# Patient Record
Sex: Male | Born: 1975 | Race: White | Hispanic: No | Marital: Married
Health system: Midwestern US, Community
[De-identification: ages and names within clinical notes are randomized; demographics above are authoritative.]

## PROBLEM LIST (undated history)

## (undated) DIAGNOSIS — R519 Headache, unspecified: Secondary | ICD-10-CM

## (undated) DIAGNOSIS — L0291 Cutaneous abscess, unspecified: Secondary | ICD-10-CM

## (undated) DIAGNOSIS — G43909 Migraine, unspecified, not intractable, without status migrainosus: Secondary | ICD-10-CM

## (undated) DIAGNOSIS — R51 Headache: Secondary | ICD-10-CM

## (undated) DIAGNOSIS — B019 Varicella without complication: Secondary | ICD-10-CM

## (undated) DIAGNOSIS — E785 Hyperlipidemia, unspecified: Secondary | ICD-10-CM

## (undated) DIAGNOSIS — E119 Type 2 diabetes mellitus without complications: Secondary | ICD-10-CM

## (undated) HISTORY — DX: Hyperlipidemia, unspecified: E78.5

## (undated) HISTORY — DX: Type 2 diabetes mellitus without complications: E11.9

## (undated) HISTORY — DX: Headache, unspecified: R51.9

## (undated) HISTORY — DX: Migraine, unspecified, not intractable, without status migrainosus: G43.909

## (undated) HISTORY — DX: Headache: R51

## (undated) HISTORY — DX: Varicella without complication: B01.9

## (undated) HISTORY — PX: INCISE AND DRAIN ABCESS: PRO64

---

## 2012-03-01 ENCOUNTER — Emergency Department (HOSPITAL_COMMUNITY)
Admission: EM | Admit: 2012-03-01 | Discharge: 2012-03-01 | Disposition: A | Discharge status: Home / Self Care | Payer: Worker's Compensation | Attending: Emergency Medicine | Admitting: Emergency Medicine

## 2012-03-01 DIAGNOSIS — S61209A Unspecified open wound of unspecified finger without damage to nail, initial encounter: Secondary | ICD-10-CM | POA: Insufficient documentation

## 2012-03-01 DIAGNOSIS — Y9389 Activity, other specified: Secondary | ICD-10-CM | POA: Insufficient documentation

## 2012-03-01 DIAGNOSIS — Y929 Unspecified place or not applicable: Secondary | ICD-10-CM | POA: Insufficient documentation

## 2012-03-01 DIAGNOSIS — S61019A Laceration without foreign body of unspecified thumb without damage to nail, initial encounter: Secondary | ICD-10-CM

## 2012-03-01 DIAGNOSIS — W260XXA Contact with knife, initial encounter: Secondary | ICD-10-CM | POA: Insufficient documentation

## 2012-03-01 MED ORDER — IBUPROFEN 800 MG PO TABS: 800.0000 mg | ORAL_TABLET | Freq: Three times a day (TID) | ORAL | Status: DC | PRN

## 2012-03-01 NOTE — ED Provider Notes (Signed)
Medical screening examination/treatment/procedure(s) were performed by non-physician practitioner and as supervising physician I was immediately available for consultation/collaboration.  Gerhard Munch, MD 03/01/12 [phone]

## 2012-03-01 NOTE — ED Provider Notes (Signed)
History     CSN: [id number]  Arrival date & time 03/01/12  2033   First MD Initiated Contact with Patient 03/01/12 2110      Chief Complaint  Patient presents with  . Laceration    (Consider location/radiation/quality/duration/timing/severity/associated sxs/prior treatment) HPI Patient presents emergency department laceration to the lateral aspect of his right thumb.  Patient states he has full range of motion and normal sensation in his thumb. patient states he was sharpening a knife and went to wipe the knife off and missed the towel and cut his finger.  Patient denies numbness or weakness.  Patient states shot is up-to-date, having been updated 3 years ago. No past medical history on file.  No past surgical history on file.  No family history on file.  History  Substance Use Topics  . Smoking status: Not on file  . Smokeless tobacco: Not on file  . Alcohol Use: Not on file      Review of Systems All other systems negative except as documented in the HPI. All pertinent positives and negatives as reviewed in the HPI.  Allergies  Review of patient's allergies indicates no known allergies.  Home Medications  No current outpatient prescriptions on file.  BP 141/91  Pulse 96  Temp 98.1 F (36.7 C) (Oral)  Resp 16  SpO2 100%  Physical Exam  Constitutional: He is oriented to person, place, and time. He appears well-developed and well-nourished. No distress.  HENT:  Head: Normocephalic and atraumatic.  Pulmonary/Chest: Effort normal.  Musculoskeletal:       Right hand: He exhibits laceration. He exhibits normal range of motion, normal two-point discrimination and normal capillary refill. normal sensation noted. Normal strength noted. He exhibits no finger abduction.       Hands: Neurological: He is alert and oriented to person, place, and time.  Skin: Skin is warm and dry.    ED Course  Procedures (including critical care time)  LACERATION REPAIR Performed by:  Carlyle Dolly Authorized by: Carlyle Dolly Consent: Verbal consent obtained. Risks and benefits: risks, benefits and alternatives were discussed Consent given by: patient Patient identity confirmed: provided demographic data Prepped and Draped in normal sterile fashion Wound explored  Laceration Location: R lateral thumb  Laceration Length: 3 cm  No Foreign Bodies seen or palpated  Anesthesia: local infiltration  Local anesthetic: lidocaine 2% w/o  epinephrine  Anesthetic total: 5 ml  Irrigation method: syringe Amount of cleaning: standard  Skin closure: 4-0 Prolene  Number of sutures: 4  Technique: Simple Interrupted  Patient tolerance: Patient tolerated the procedure well with no immediate complications.   Wound is a superficial flap. Patient is advised to have sutures out in 10-14 days. Told to return here as needed. Keep area clean and dry. The knife was very sharp and mild a fine slice. MDM          Carlyle Dolly, PA-C 03/01/12 2229

## 2012-03-01 NOTE — ED Notes (Signed)
Pt cut R thumb with large kitchen knife. Bleeding controlled with pressure.

## 2012-04-12 ENCOUNTER — Ambulatory Visit (HOSPITAL_BASED_OUTPATIENT_CLINIC_OR_DEPARTMENT_OTHER): Payer: 59

## 2012-04-12 ENCOUNTER — Ambulatory Visit (INDEPENDENT_AMBULATORY_CARE_PROVIDER_SITE_OTHER): Payer: 59 | Admitting: Family Medicine

## 2012-04-12 ENCOUNTER — Ambulatory Visit (HOSPITAL_BASED_OUTPATIENT_CLINIC_OR_DEPARTMENT_OTHER)
Admission: RE | Admit: 2012-04-12 | Discharge: 2012-04-12 | Disposition: A | Discharge status: Home / Self Care | Payer: 59 | Source: Ambulatory Visit | Attending: Family Medicine | Admitting: Family Medicine

## 2012-04-12 ENCOUNTER — Encounter: Payer: Self-pay | Admitting: Family Medicine

## 2012-04-12 VITALS — BP 100/80 | HR 69 | Temp 98.1°F | Ht 68.0 in | Wt 204.2 lb

## 2012-04-12 DIAGNOSIS — M79609 Pain in unspecified limb: Secondary | ICD-10-CM | POA: Insufficient documentation

## 2012-04-12 DIAGNOSIS — F17200 Nicotine dependence, unspecified, uncomplicated: Secondary | ICD-10-CM

## 2012-04-12 DIAGNOSIS — M7989 Other specified soft tissue disorders: Secondary | ICD-10-CM

## 2012-04-12 DIAGNOSIS — E119 Type 2 diabetes mellitus without complications: Secondary | ICD-10-CM | POA: Insufficient documentation

## 2012-04-12 DIAGNOSIS — E1149 Type 2 diabetes mellitus with other diabetic neurological complication: Secondary | ICD-10-CM

## 2012-04-12 DIAGNOSIS — B353 Tinea pedis: Secondary | ICD-10-CM

## 2012-04-12 DIAGNOSIS — G43909 Migraine, unspecified, not intractable, without status migrainosus: Secondary | ICD-10-CM

## 2012-04-12 DIAGNOSIS — M799 Soft tissue disorder, unspecified: Secondary | ICD-10-CM

## 2012-04-12 LAB — CBC WITH DIFFERENTIAL/PLATELET
Basophils Absolute: 0 10*3/uL (ref 0.0–0.1)
Basophils Relative: 0.4 % (ref 0.0–3.0)
Eosinophils Absolute: 0.1 10*3/uL (ref 0.0–0.7)
MCHC: 35.2 g/dL (ref 30.0–36.0)
MCV: 93.1 fl (ref 78.0–100.0)
Monocytes Absolute: 0.6 10*3/uL (ref 0.1–1.0)
Neutrophils Relative %: 59.1 % (ref 43.0–77.0)
Platelets: 210 10*3/uL (ref 150.0–400.0)
RBC: 4.85 Mil/uL (ref 4.22–5.81)
RDW: 12.6 % (ref 11.5–14.6)

## 2012-04-12 LAB — TSH: TSH: 1.38 u[IU]/mL (ref 0.35–5.50)

## 2012-04-12 LAB — HEPATIC FUNCTION PANEL
AST: 14 U/L (ref 0–37)
Bilirubin, Direct: 0.1 mg/dL (ref 0.0–0.3)
Total Bilirubin: 0.5 mg/dL (ref 0.3–1.2)

## 2012-04-12 LAB — LIPID PANEL
Cholesterol: 142 mg/dL (ref 0–200)
HDL: 24.3 mg/dL — ABNORMAL LOW (ref >39.00)
VLDL: 51 mg/dL — ABNORMAL HIGH (ref 0.0–40.0)

## 2012-04-12 LAB — BASIC METABOLIC PANEL
BUN: 9 mg/dL (ref 6–23)
GFR: 135.03 mL/min (ref >60.00)
Potassium: 3.9 mEq/L (ref 3.5–5.1)

## 2012-04-12 MED ORDER — CLOTRIMAZOLE-BETAMETHASONE 1-0.05 % EX CREA: TOPICAL_CREAM | Freq: Two times a day (BID) | CUTANEOUS | Status: DC

## 2012-04-12 MED ORDER — SUMATRIPTAN SUCCINATE 50 MG PO TABS: 50.0000 mg | ORAL_TABLET | ORAL | Status: DC | PRN

## 2012-04-12 NOTE — Patient Instructions (Addendum)
Follow up in 3-4 weeks We'll notify you of your lab results and start meds based on this Start the Lotrisone cream twice daily We'll call you with your [place] appt If you start to develop a headache, take 2 excedrin for migraine as soon as you can If the headache becomes full blown, take the Imitrex QUIT SMOKING! Limit your sugar and carb intake to lower your sugars We'll call you with your eye exam Call with any questions or concerns Hang in there!!!

## 2012-04-12 NOTE — Progress Notes (Signed)
  Subjective:    Patient ID: Edward [name], male    DOB: 03/25/1975, 37 y.o.   MRN: [id number]  HPI New to establish.  No previous MD  DM- pt was dx'd 2 yrs ago while hospitalized for cat bite.  Took 1 month of Metformin and then stopped.  Not checking sugars, not following low carb diet.  No recent eye exam.  No CP, some SOB w/ activity, weekly R frontal HAs.  No recent eye exam.  Increase thirst, urination, hunger.  + 20 lb weight loss.  HAs- wife reports 'scary HAs'.  Occuring ~1x/week, will improve w/ sleep.  R frontal.  Wife reports last week he had 'almost like a seizure' w/ HA.  No nausea.  + phonophobia, no photophobia.  HAs will typically occur at night.  'leg problems'- having an 'itchy burn' of feet bilaterally, knot on R posterior calf- pain will radiate up into knee w/ prolonged standing.  Seems to be growing in size.  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   Review of Systems For ROS see HPI     Objective:   Physical Exam  Vitals reviewed. Constitutional: He is oriented to person, place, and time. He appears well-developed and well-nourished. No distress.  HENT:  Head: Normocephalic and atraumatic.  Eyes: Conjunctivae and EOM are normal. Pupils are equal, round, and reactive to light.  Neck: Normal range of motion. Neck supple. No thyromegaly present.  Cardiovascular: Normal rate, regular rhythm, normal heart sounds and intact distal pulses.   No murmur heard. Pulmonary/Chest: Effort normal and breath sounds normal. No respiratory distress.  Abdominal: Soft. Bowel sounds are normal. He exhibits no distension.  Musculoskeletal: He exhibits no edema.  Soft tissue mass on R posterior calf, + TTP, ~3 cm  Lymphadenopathy:    He has no cervical adenopathy.  Neurological: He is alert and oriented to person, place, and time. He has normal reflexes. No cranial nerve deficit. Coordination normal.  Skin: Skin is warm and dry.  Fungal infxn of feet bilaterally  Psychiatric: He has a normal  mood and affect. His behavior is normal.          Assessment & Plan:

## 2012-04-16 ENCOUNTER — Encounter: Payer: Self-pay | Admitting: Family Medicine

## 2012-04-17 MED ORDER — METFORMIN HCL 1000 MG PO TABS: ORAL_TABLET | ORAL | Status: DC

## 2012-04-18 ENCOUNTER — Telehealth: Payer: Self-pay | Admitting: *Deleted

## 2012-04-18 ENCOUNTER — Encounter: Payer: Self-pay | Admitting: *Deleted

## 2012-04-18 DIAGNOSIS — M7989 Other specified soft tissue disorders: Secondary | ICD-10-CM

## 2012-04-18 NOTE — Telephone Encounter (Signed)
Message copied by Verdie Shire on Tue Apr 18, 2012 12:11 PM ------      Message from: Sheliah Hatch      Created: Wed Apr 12, 2012 11:12 AM       No blood clot noted.  Will need to see general surgery if he would like this removed ------

## 2012-04-18 NOTE — Telephone Encounter (Addendum)
Spoke with the pt on 04-17-12 and informed him of recent US results and note.  Pt understood and agreed to the referral to general surgery.  Refer ordered.//AB/CMA

## 2012-04-24 ENCOUNTER — Encounter (INDEPENDENT_AMBULATORY_CARE_PROVIDER_SITE_OTHER): Payer: Self-pay | Admitting: Surgery

## 2012-04-24 ENCOUNTER — Ambulatory Visit (INDEPENDENT_AMBULATORY_CARE_PROVIDER_SITE_OTHER): Payer: Commercial Managed Care - PPO | Admitting: Surgery

## 2012-04-24 ENCOUNTER — Other Ambulatory Visit (INDEPENDENT_AMBULATORY_CARE_PROVIDER_SITE_OTHER): Payer: Self-pay | Admitting: Surgery

## 2012-04-24 VITALS — BP 134/86 | HR 68 | Temp 97.6°F | Resp 14 | Ht 68.0 in | Wt 205.2 lb

## 2012-04-24 DIAGNOSIS — M799 Soft tissue disorder, unspecified: Secondary | ICD-10-CM

## 2012-04-24 DIAGNOSIS — M7989 Other specified soft tissue disorders: Secondary | ICD-10-CM

## 2012-04-24 NOTE — Patient Instructions (Signed)
  [facility] -- DISCHARGE INSTRUCTIONS  REMINDER:   Carry a list of your medications and allergies with you at all times  Call your pharmacy at least 1 week in advance to refill prescriptions  Do not mix any prescribed pain medicine with alcohol  Do not drive any motor vehicles while taking pain medication  Take medications with food unless otherwise directed  Follow-up appointments (date to return to physician): Please call [phone] to confirm your follow up appointment with your surgeon.  Call your Surgeon if you have:  Temperature greater than 101.0  Persistent nausea and vomiting  Severe uncontrolled pain  Redness, tenderness, or signs of infection (pain, swelling, redness, odor or    green/yellow discharge around the site)  Difficulty breathing, headache or visual disturbances  Hives  Persistent dizziness or light-headedness  Any other questions or concerns you may have after discharge  In an emergency, call 911 or go to an Emergency Department at a nearby hospital.  Diet: Begin with liquids, and if they are tolerated, resume your usual diet.  Avoid spicy, greasy or heavy foods.  If you have nausea or vomiting, go back to liquids.  If you cannot keep liquids down, call your doctor.  Avoid alcohol consumption while on prescription pain medications. Good nutrition promotes healing. Increase fiber and fluids.   Velora Heckler, MD, [facility] Office: [phone]

## 2012-04-24 NOTE — Progress Notes (Signed)
General Surgery [facility]  Chief Complaint  Patient presents with  . Mass    new pt - eval mass on right calf - referral from Dr. Neena Rhymes    HISTORY: Patient is a 37 year old white male who presents on referral from his primary care physician with a soft tissue mass on the right posterior medial calf. This has been present for approximately one year. He relates no history of trauma. He has noted gradual enlargement. He has occasional pain with physical activity.  Patient underwent a venous duplex study of the right lower extremity. This showed a normal venous system. The mass measured 2.1 x 0.6 x 1.2 cm. It appears to be largely cystic although there is a 7 mm long linear solid area which is echogenic and may represent calcification. The mass does not appear to be related to the venous system.  Patient now presents on referral for consideration for excisional biopsy.  Past Medical History  Diagnosis Date  . Chicken pox   . Diabetes mellitus without complication   . Increased frequency of headaches      Current Outpatient Prescriptions  Medication Sig Dispense Refill  . clotrimazole-betamethasone (LOTRISONE) cream Apply topically 2 (two) times daily.  30 g  0  . ibuprofen (ADVIL,MOTRIN) 800 MG tablet Take 1 tablet (800 mg total) by mouth every 8 (eight) hours as needed for pain.  21 tablet  0  . metFORMIN (GLUCOPHAGE) 1000 MG tablet 1/2 tab twice daily x2 weeks and then increase to 1 tab twice daily  60 tablet  3  . SUMAtriptan (IMITREX) 50 MG tablet Take 1 tablet (50 mg total) by mouth every 2 (two) hours as needed for migraine.  10 tablet  0   No current facility-administered medications for this visit.     No Known Allergies   Family History  Problem Relation Age of Onset  . Heart disease Mother   . Diabetes Mother   . Hyperlipidemia Father   . Stroke Father   . Hypertension Father   . Cancer Paternal Grandfather     prostate      History   Social History  . Marital Status: Married    Spouse Name: N/A    Number of Children: N/A  . Years of Education: N/A   Social History Main Topics  . Smoking status: Current Every Day Smoker -- 1.00 packs/day    Types: Cigarettes  . Smokeless tobacco: None  . Alcohol Use: Yes  . Drug Use: No  . Sexually Active: None   Other Topics Concern  . None   Social History Narrative  . None     REVIEW OF SYSTEMS - PERTINENT POSITIVES ONLY: Gradual slow enlargement of mass right posterior calf. Mild discomfort with physical activity. No previous such lesions removed.  EXAM: Filed Vitals:   04/24/12 1141  BP: 134/86  Pulse: 68  Temp: 97.6 F (36.4 C)  Resp: 14    HEENT: normocephalic; pupils equal and reactive; sclerae clear; dentition good; mucous membranes moist NECK:  symmetric on extension; no palpable anterior or posterior cervical lymphadenopathy; no supraclavicular masses; no tenderness CHEST: clear to auscultation bilaterally without rales, rhonchi, or wheezes CARDIAC: regular rate and rhythm without significant murmur; peripheral pulses are full EXT:  non-tender without edema; no deformity; prominent varicosities left calf; palpable mass right posterior medial calf, subcutaneous, approximately 2.5 cm in greatest dimension, nontender NEURO: no gross focal deficits; no sign of tremor   LABORATORY RESULTS: See [facility] (CHL-Epic) for most recent results   RADIOLOGY RESULTS: See [facility] (CHL-Epic) for most recent results   IMPRESSION: Right calf mass, 2.5 cm, of uncertain significance  PLAN: The patient and I discussed the above findings. Given the fact that this is gradually enlarged and that the diagnosis is in question after diagnostic studies, I have recommended surgical excision for definitive diagnosis. Patient agrees. We will perform this as an outpatient surgical procedure in the near future at a time convenient for the patient.   Restrictions on his activities following the procedure are discussed.  The risks and benefits of the procedure have been discussed at length with the patient.  The patient understands the proposed procedure, potential alternative treatments, and the course of recovery to be expected.  All of the patient's questions have been answered at this time.  The patient wishes to proceed with surgery.  Velora Heckler, MD, FACS General & Endocrine Surgery [facility]   Visit Diagnoses: 1. Soft tissue mass     Primary Care Physician: Neena Rhymes, MD

## 2012-04-27 ENCOUNTER — Ambulatory Visit (INDEPENDENT_AMBULATORY_CARE_PROVIDER_SITE_OTHER): Payer: 59 | Admitting: Internal Medicine

## 2012-04-27 VITALS — BP 120/76 | HR 71 | Temp 98.0°F | Resp 16 | Ht 68.0 in | Wt 204.0 lb

## 2012-04-27 DIAGNOSIS — B3749 Other urogenital candidiasis: Secondary | ICD-10-CM

## 2012-04-27 DIAGNOSIS — M79609 Pain in unspecified limb: Secondary | ICD-10-CM

## 2012-04-27 DIAGNOSIS — M79604 Pain in right leg: Secondary | ICD-10-CM

## 2012-04-27 DIAGNOSIS — B3742 Candidal balanitis: Secondary | ICD-10-CM

## 2012-04-27 DIAGNOSIS — R229 Localized swelling, mass and lump, unspecified: Secondary | ICD-10-CM

## 2012-04-27 DIAGNOSIS — R2241 Localized swelling, mass and lump, right lower limb: Secondary | ICD-10-CM

## 2012-04-27 DIAGNOSIS — E1310 Other specified diabetes mellitus with ketoacidosis without coma: Secondary | ICD-10-CM

## 2012-04-27 DIAGNOSIS — E1110 Type 2 diabetes mellitus with ketoacidosis without coma: Secondary | ICD-10-CM

## 2012-04-27 MED ORDER — HYDROCODONE-ACETAMINOPHEN 5-325 MG PO TABS: 1.0000 | ORAL_TABLET | Freq: Four times a day (QID) | ORAL | Status: DC | PRN

## 2012-04-27 MED ORDER — ECONAZOLE NITRATE 1 % EX CREA: TOPICAL_CREAM | Freq: Every day | CUTANEOUS | Status: DC

## 2012-04-27 MED ORDER — FLUCONAZOLE 150 MG PO TABS: ORAL_TABLET | ORAL | Status: DC

## 2012-04-27 NOTE — Patient Instructions (Signed)
Balanitis Balanitis is an common infection of the head (glans) of the penis. CAUSES  Balanitis has multiple causes. Frequently balanitis is the result of poor personal hygiene. Especially if no circumcision has been done. Without adequate washing, many different kinds of germs (viruses, bacteria, and yeast) collect between the foreskin and the glans. This can cause an infection. Lack of air and irritation from a normal secretion called smegma contribute to the cause in uncircumcised males. Other causes include chemical irritation by certain soaps (especially soaps with perfumes). When no circumcision has been done, a frequent cause of poor hygiene is that the tip of the foreskin is tight (phimosis) and cannot be pulled back for adequate washing. Illnesses in other areas of the body can also cause balanitis. This includes illnesses that cause water retention and swelling, such as:  Heart failure.  Cirrhosis of the liver.  Kidney problems. Other contributing causes include:  Obesity.  Certain allergies to drugs such as tetracycline and sulfa.  Diabetes. SYMPTOMS  Symptoms may include:  Discharge coming from under the foreskin.  Tenderness.  Itching and inability to get an erection (because of the pain).  Redness and a rash is frequently seen.  If the problem remains for a while, sores can be seen on the glans and on the foreskin. If the condition is not treated other complications such as a scar of the opening to the urethra (tube that carries the urine out from the bladder) can occur and block the bladder. This narrowing is called meatal stenosis. Other problems can occur such as:  Infection of the lymph nodes in the crease of the groin.  Ballooning of the foreskin when voiding (when the foreskin opening has scarred down and been made smaller).  Blockage of the bladder.  Frequent urinary infections occur in children with balanitis. HOME CARE INSTRUCTIONS   Pull back foreskin  to urinate and when washing.  Pull back foreskin when putting medication on the affected area to prevent the foreskin from swelling and being trapped behind the head.  Keep foreskin and glans clean and dry.  Sitz baths may be helpful.  Take your medication as directed .  Pain medication, if needed.  Circumcision (may be recommended). SEEK IMMEDIATE MEDICAL CARE IF:   The affected area becomes trapped behind the head.  You start a fever.  The swelling increases. Document Released: 07/04/2008 Document Revised: 05/10/2011 Document Reviewed: 07/04/2008 Exit[employer] Patient Information 2013 [employer].  

## 2012-04-27 NOTE — Progress Notes (Signed)
  Subjective:    Patient ID: Edward [name], male    DOB: [birth year], 37 y.o.   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 Labs and chart reviewed in epic. Has rash on penis, itchy with white disch. No pain. Also has pain right calf and has a mass in calf needs removal cell type unknown. Calf cramping and sxs all the time. Surgery to be scheduled   Review of Systems     Objective:   Physical Exam  Vitals reviewed. Constitutional: He appears well-developed and well-nourished.  Genitourinary: Testes normal.    Right testis shows no tenderness. Penile erythema present. No penile tenderness.  Lymphadenopathy:       Right: No inguinal adenopathy present.       Left: No inguinal adenopathy present.   Red scaly rash all over distal penis with cheesy white discharge. Mass right calf       Assessment & Plan:  Yeast balanitis acute NIDDM uncontrolled Mass leg Pain leg  Econazole/diflucan/HC

## 2012-04-30 NOTE — Assessment & Plan Note (Signed)
New to provider, ongoing for pt.  Strongly encouraged smoking cessation.  Will discuss possibly starting chantix at future visits but getting DM under control is top priority.

## 2012-04-30 NOTE — Assessment & Plan Note (Signed)
New.  Not consistent w/ varicosity.  Get [place] to assess.  Depending on [place] results, refer to general surgery.  Pt expressed understanding and is in agreement w/ plan.

## 2012-04-30 NOTE — Assessment & Plan Note (Addendum)
New to Edward [name], ongoing for pt.  Pt dx'd years ago but not currently taking meds or following low carb diet, getting eye exams, or attempting to control sugars.  Refer for eye exam.  Check labs to determine baseline A1C and determine appropriate meds at that time.  Stressed need for compliance.  Will follow closely.

## 2012-04-30 NOTE — Assessment & Plan Note (Signed)
New to provider.  Start Lotrisone twice daily.  Pt expressed understanding and is in agreement w/ plan.

## 2012-04-30 NOTE — Assessment & Plan Note (Signed)
New to provider.  Pt's sxs consistent w/ migraine.  Discussed migraine tx (see pt instructions).  imitrex script given.  In no improvement, will refer to neuro.  Reviewed supportive care and red flags that should prompt return.  Pt expressed understanding and is in agreement w/ plan.

## 2012-05-01 ENCOUNTER — Encounter: Payer: Self-pay | Admitting: Family Medicine

## 2012-05-01 ENCOUNTER — Ambulatory Visit (INDEPENDENT_AMBULATORY_CARE_PROVIDER_SITE_OTHER): Payer: 59 | Admitting: Family Medicine

## 2012-05-01 VITALS — BP 120/70 | HR 77 | Temp 97.9°F | Ht 68.0 in | Wt 202.2 lb

## 2012-05-01 DIAGNOSIS — M799 Soft tissue disorder, unspecified: Secondary | ICD-10-CM

## 2012-05-01 DIAGNOSIS — B353 Tinea pedis: Secondary | ICD-10-CM

## 2012-05-01 DIAGNOSIS — E1149 Type 2 diabetes mellitus with other diabetic neurological complication: Secondary | ICD-10-CM

## 2012-05-01 DIAGNOSIS — M7989 Other specified soft tissue disorders: Secondary | ICD-10-CM

## 2012-05-01 NOTE — Progress Notes (Signed)
  Subjective:    Patient ID: Edward [name], male    DOB: 1975-05-14, 37 y.o.   MRN: [id number]  HPI Leg pain- pt is due to have surgery but they want $500 up front and pt doesn't have the cash right now.  Surgery is now scheduled for late April.  Pt now fears cancer after visit to UC.  Now on hydrocodone for pain.  DM- chronic problem.  Is taking 500mg  BID.  Set to increase to 1000mg  BID.  Has tried to make dietary changes and limit carb intake.  Referral for eye exam made at last visit- has not yet gone.  Tinea pedis- sxs are improving, stopped using Lotrisone when he developed yeast balanitis.  Now using econazole on groin but nothing on feet   Review of Systems For ROS see HPI     Objective:   Physical Exam  Constitutional: He is oriented to person, place, and time. He appears well-developed and well-nourished. No distress.  HENT:  Head: Normocephalic and atraumatic.  Eyes: Conjunctivae and EOM are normal. Pupils are equal, round, and reactive to light.  Neck: Normal range of motion. Neck supple. No thyromegaly present.  Cardiovascular: Normal rate, regular rhythm, normal heart sounds and intact distal pulses.   No murmur heard. Pulmonary/Chest: Effort normal and breath sounds normal. No respiratory distress.  Abdominal: Soft. Bowel sounds are normal. He exhibits no distension.  Musculoskeletal: He exhibits no edema.  R posterior calf soft tissue mass, + TTP, pt limping  Lymphadenopathy:    He has no cervical adenopathy.  Neurological: He is alert and oriented to person, place, and time. No cranial nerve deficit.  Skin: Skin is warm and dry.  Fungal infxn of feet bilaterally  Psychiatric: He has a normal mood and affect. His behavior is normal.          Assessment & Plan:

## 2012-05-01 NOTE — Patient Instructions (Addendum)
Follow up in 2 months to recheck sugar and triglycerides Increase the Metformin to 1 tab twice daily The the econazole cream on your feet as well as your gron Keep up the good work- you can do this!

## 2012-05-02 NOTE — Assessment & Plan Note (Signed)
New.  Pt is requiring surgery to remove the mass but he currently is unable to afford this.  Encouraged him to make this a priority.  Due to pain, pt is currently on narcotics from UC- may need to refill in the future.

## 2012-05-02 NOTE — Assessment & Plan Note (Signed)
Improving but still present.  Rather than using the lotrisone, encouraged him to use the cream for his groin on both areas.  Pt expressed understanding and is in agreement w/ plan.

## 2012-05-02 NOTE — Assessment & Plan Note (Signed)
Pt's sugars are improving and pt reports feeling 'better' since starting med.  Missed eye exam.  Will need to reschedule.  Due for A1C in 2 months.  Increase metformin to 1000mg  bid.  Will continue to follow closely.

## 2012-05-04 ENCOUNTER — Telehealth: Payer: Self-pay | Admitting: *Deleted

## 2012-05-04 ENCOUNTER — Other Ambulatory Visit: Payer: Self-pay | Admitting: Internal Medicine

## 2012-05-04 DIAGNOSIS — R2241 Localized swelling, mass and lump, right lower limb: Secondary | ICD-10-CM

## 2012-05-04 DIAGNOSIS — M79604 Pain in right leg: Secondary | ICD-10-CM

## 2012-05-04 MED ORDER — HYDROCODONE-ACETAMINOPHEN 5-325 MG PO TABS: 1.0000 | ORAL_TABLET | Freq: Four times a day (QID) | ORAL | Status: DC | PRN

## 2012-05-04 NOTE — Telephone Encounter (Signed)
Last OV 05-02-11, last filled 04-27-12 #30

## 2012-05-04 NOTE — Telephone Encounter (Signed)
Ok for #30, no refills.  This is only for severe pain and should last longer than 1 week.  needs to sign agreement and do UDS if not already done

## 2012-05-04 NOTE — Telephone Encounter (Signed)
Left message to call office, to advise Pt of Dr Beverely Low comments and control agreement.

## 2012-05-12 ENCOUNTER — Ambulatory Visit: Payer: 59

## 2012-05-12 ENCOUNTER — Telehealth: Payer: Self-pay | Admitting: Family Medicine

## 2012-05-12 DIAGNOSIS — M79604 Pain in right leg: Secondary | ICD-10-CM

## 2012-05-12 DIAGNOSIS — R2241 Localized swelling, mass and lump, right lower limb: Secondary | ICD-10-CM

## 2012-05-12 NOTE — Telephone Encounter (Signed)
refill Hydrocodone-APA 5.325, Take 1 tablet by mouth every 6 hours as needed for severe pain, last fill 3.10.14--Pharmacy Comments -PT KNOWS IT'S NOT DUE TIL MONDAY THANKS

## 2012-05-14 NOTE — Telephone Encounter (Signed)
RTC

## 2012-05-15 ENCOUNTER — Other Ambulatory Visit: Payer: Self-pay | Admitting: Family Medicine

## 2012-05-15 ENCOUNTER — Encounter: Payer: Self-pay | Admitting: Family Medicine

## 2012-05-15 DIAGNOSIS — R2241 Localized swelling, mass and lump, right lower limb: Secondary | ICD-10-CM

## 2012-05-15 DIAGNOSIS — M79604 Pain in right leg: Secondary | ICD-10-CM

## 2012-05-15 NOTE — Telephone Encounter (Signed)
This medicine is only for severe pain and should not be used regularly (which it is if he needs a refill).  Ok for #30 but if pain doesn't improve and he isn't going to proceed w/ surgery, he will need pain clinic referral

## 2012-05-15 NOTE — Telephone Encounter (Signed)
Please advise on RF request.  Last RF:05-08-12,no recent OV.//AB/CMA

## 2012-05-16 NOTE — Telephone Encounter (Signed)
Received the med request and sent to the provider to address.//AB/CMA

## 2012-05-16 NOTE — Telephone Encounter (Signed)
Lm @ (4:48pm) asking the pt to RTC.//AB/CMA

## 2012-05-17 MED ORDER — HYDROCODONE-ACETAMINOPHEN 5-325 MG PO TABS: 1.0000 | ORAL_TABLET | Freq: Four times a day (QID) | ORAL | Status: DC | PRN

## 2012-05-17 NOTE — Telephone Encounter (Signed)
Ok for #45, no refills 

## 2012-05-17 NOTE — Telephone Encounter (Signed)
Rx has been approved and put up front for the pt to pick-up.//AB/CMA

## 2012-05-17 NOTE — Telephone Encounter (Signed)
Last seen 05/01/12 and filled 05/04/12 # 30. Please advise      KP

## 2012-05-17 NOTE — Telephone Encounter (Signed)
LM @ (10:18am) asking the pt to RTC.//AB/CMA

## 2012-05-17 NOTE — Telephone Encounter (Signed)
Spoke with the pt's wife and informed her to let the pt know that the medicine is to be used only for severe pain and should not be used regularly.  Informed her that Dr. Beverely Low is okaying #30, but if the pain doesn't improve and he isn't going to proceed w/ surgery, he will need pain clinic referral.  The wife stated that she has been checking his calf and it is red.  Asked her if the pt needs to be rechecked, and she stated that the pt has not proceeded with the surgery b/c of the money.  She stated that she will keep checking his calf, and she will let the pt know about the pain clinic referral.  The wife stated that she or the pt will pick-up the rx.  Verbally informed Dr. Beverely Low of this.//AB/CMA

## 2012-05-24 ENCOUNTER — Encounter: Payer: Self-pay | Admitting: Family Medicine

## 2012-05-25 ENCOUNTER — Encounter: Payer: Self-pay | Admitting: Family Medicine

## 2012-05-25 ENCOUNTER — Other Ambulatory Visit: Payer: Self-pay | Admitting: Family Medicine

## 2012-05-25 DIAGNOSIS — R2241 Localized swelling, mass and lump, right lower limb: Secondary | ICD-10-CM

## 2012-05-25 DIAGNOSIS — M79604 Pain in right leg: Secondary | ICD-10-CM

## 2012-05-26 ENCOUNTER — Encounter: Payer: Self-pay | Admitting: Family Medicine

## 2012-05-26 DIAGNOSIS — M79604 Pain in right leg: Secondary | ICD-10-CM

## 2012-05-26 DIAGNOSIS — R2241 Localized swelling, mass and lump, right lower limb: Secondary | ICD-10-CM

## 2012-05-26 NOTE — Telephone Encounter (Signed)
Last filled 05/17/12 # 30. Please advise     KP

## 2012-05-26 NOTE — Telephone Encounter (Signed)
Ok for #30, if he is going through 30 Vicodin in 10 days, he needs to have that mass removed.  Please have him discuss a payment plan w/ the surgeons

## 2012-05-29 MED ORDER — HYDROCODONE-ACETAMINOPHEN 5-325 MG PO TABS: 1.0000 | ORAL_TABLET | Freq: Four times a day (QID) | ORAL | Status: DC | PRN

## 2012-05-29 NOTE — Telephone Encounter (Signed)
Rx refill was printed and will be up front for the pt to pick-up.  Informed the pt by MyChart that the rx was ready for pick-up//AB/CMA

## 2012-05-30 NOTE — Telephone Encounter (Signed)
See MyChart message on (05-29-12) regarding med refill and referral to Pain Management.//AB/CMA

## 2012-06-06 ENCOUNTER — Encounter: Payer: Self-pay | Admitting: Family Medicine

## 2012-10-11 ENCOUNTER — Ambulatory Visit: Payer: 59 | Admitting: Family Medicine

## 2012-10-11 DIAGNOSIS — Z0289 Encounter for other administrative examinations: Secondary | ICD-10-CM

## 2012-12-04 ENCOUNTER — Emergency Department (HOSPITAL_BASED_OUTPATIENT_CLINIC_OR_DEPARTMENT_OTHER): Payer: 59

## 2012-12-04 ENCOUNTER — Encounter (HOSPITAL_BASED_OUTPATIENT_CLINIC_OR_DEPARTMENT_OTHER): Payer: Self-pay

## 2012-12-04 ENCOUNTER — Emergency Department (HOSPITAL_BASED_OUTPATIENT_CLINIC_OR_DEPARTMENT_OTHER)
Admission: EM | Admit: 2012-12-04 | Discharge: 2012-12-04 | Disposition: A | Discharge status: Home / Self Care | Payer: 59 | Attending: Emergency Medicine | Admitting: Emergency Medicine

## 2012-12-04 DIAGNOSIS — Z79899 Other long term (current) drug therapy: Secondary | ICD-10-CM | POA: Insufficient documentation

## 2012-12-04 DIAGNOSIS — F17200 Nicotine dependence, unspecified, uncomplicated: Secondary | ICD-10-CM | POA: Insufficient documentation

## 2012-12-04 DIAGNOSIS — K2970 Gastritis, unspecified, without bleeding: Secondary | ICD-10-CM | POA: Insufficient documentation

## 2012-12-04 DIAGNOSIS — Z8679 Personal history of other diseases of the circulatory system: Secondary | ICD-10-CM | POA: Insufficient documentation

## 2012-12-04 DIAGNOSIS — E119 Type 2 diabetes mellitus without complications: Secondary | ICD-10-CM | POA: Insufficient documentation

## 2012-12-04 DIAGNOSIS — Z8619 Personal history of other infectious and parasitic diseases: Secondary | ICD-10-CM | POA: Insufficient documentation

## 2012-12-04 DIAGNOSIS — R10816 Epigastric abdominal tenderness: Secondary | ICD-10-CM | POA: Insufficient documentation

## 2012-12-04 LAB — COMPREHENSIVE METABOLIC PANEL
ALT: 21 U/L (ref 0–53)
AST: 13 U/L (ref 0–37)
Calcium: 9.8 mg/dL (ref 8.4–10.5)
GFR calc Af Amer: >90 mL/min (ref >90)
Sodium: 138 mEq/L (ref 135–145)
Total Protein: 7.3 g/dL (ref 6.0–8.3)

## 2012-12-04 LAB — URINALYSIS, ROUTINE W REFLEX MICROSCOPIC
Glucose, UA: >1000 mg/dL — AB
Hgb urine dipstick: NEGATIVE
Specific Gravity, Urine: 1.033 — ABNORMAL HIGH (ref 1.005–1.030)
Urobilinogen, UA: 1 mg/dL (ref 0.0–1.0)

## 2012-12-04 LAB — CBC WITH DIFFERENTIAL/PLATELET
Basophils Absolute: 0 10*3/uL (ref 0.0–0.1)
Eosinophils Absolute: 0.1 10*3/uL (ref 0.0–0.7)
Eosinophils Relative: 1 % (ref 0–5)
MCH: 32.7 pg (ref 26.0–34.0)
MCV: 92.3 fL (ref 78.0–100.0)
Platelets: 246 10*3/uL (ref 150–400)
RDW: 12.5 % (ref 11.5–15.5)
WBC: 13.6 10*3/uL — ABNORMAL HIGH (ref 4.0–10.5)

## 2012-12-04 LAB — URINE MICROSCOPIC-ADD ON

## 2012-12-04 MED ORDER — GI COCKTAIL ~~LOC~~
30.0000 mL | Freq: Once | ORAL | Status: AC
  Administered 2012-12-04: 30 mL via ORAL
  Filled 2012-12-04: qty 30

## 2012-12-04 MED ORDER — HYDROMORPHONE HCL PF 1 MG/ML IJ SOLN
1.0000 mg | Freq: Once | INTRAMUSCULAR | Status: AC
  Administered 2012-12-04: 1 mg via INTRAVENOUS
  Filled 2012-12-04: qty 1

## 2012-12-04 MED ORDER — ONDANSETRON HCL 4 MG/2ML IJ SOLN
4.0000 mg | Freq: Once | INTRAMUSCULAR | Status: AC
  Administered 2012-12-04: 4 mg via INTRAVENOUS
  Filled 2012-12-04: qty 2

## 2012-12-04 MED ORDER — PANTOPRAZOLE SODIUM 40 MG IV SOLR
40.0000 mg | Freq: Once | INTRAVENOUS | Status: AC
  Administered 2012-12-04: 40 mg via INTRAVENOUS
  Filled 2012-12-04: qty 40

## 2012-12-04 MED ORDER — PANTOPRAZOLE SODIUM 40 MG PO TBEC: 40.0000 mg | DELAYED_RELEASE_TABLET | Freq: Every day | ORAL | Status: DC

## 2012-12-04 MED ORDER — SODIUM CHLORIDE 0.9 % IV BOLUS (SEPSIS)
1000.0000 mL | Freq: Once | INTRAVENOUS | Status: AC
  Administered 2012-12-04: 1000 mL via INTRAVENOUS

## 2012-12-04 NOTE — ED Notes (Signed)
Diarrhea present, pain feels like something is grabbing and squeezing his abdomen.

## 2012-12-04 NOTE — ED Provider Notes (Signed)
CSN: [id number]     Arrival date & time 12/04/12  1829 History  This chart was scribed for Kristoph B. Bernette Mayers, MD by Greggory Stallion, ED Scribe. This patient was seen in room MH02/MH02 and the patient's care was started at 6:47 PM.   Chief Complaint  Patient presents with  . Upper abdominal spasm    The history is provided by the patient. No language interpreter was used.   HPI Comments: Edward [name] is a 37 y.o. male who presents to the Emergency Department complaining of gradual onset, intermittent epigastric abdominal spasms that started about 2 weeks ago. Pt states they are getting more frequent and worsening. He states he had the same symptoms about 2 months ago. Pt states he was having emesis a few days ago but is just dry heaving now. He states he has had about 2 bottles of Pepto Bismol with no relief. He states he was given hydrocodone and Tylenol for the knot on his leg but stopped taking it recently because he thought it was irritating his stomach. Pt denies hematemesis and difficulty urinating.   Past Medical History  Diagnosis Date  . Chicken pox   . Diabetes mellitus without complication   . Increased frequency of headaches    Past Surgical History  Procedure Laterality Date  . Incise and drain abcess      left hand and jaw   Family History  Problem Relation Age of Onset  . Heart disease Mother   . Diabetes Mother   . Hyperlipidemia Father   . Stroke Father   . Hypertension Father   . Cancer Paternal Grandfather     prostate   History  Substance Use Topics  . Smoking status: Current Every Day Smoker -- 1.00 packs/day    Types: Cigarettes  . Smokeless tobacco: Not on file  . Alcohol Use: Yes    Review of Systems A complete 10 system review of systems was obtained and all systems are negative except as noted in the HPI and PMH.   Allergies  Review of patient's allergies indicates no known allergies.  Home Medications   Current Outpatient Rx  Name   Route  Sig  Dispense  Refill  . clotrimazole-betamethasone (LOTRISONE) cream   Topical   Apply topically 2 (two) times daily.   30 g   0   . econazole nitrate 1 % cream   Topical   Apply topically daily.   30 g   2   . fluconazole (DIFLUCAN) 150 MG tablet      Take 1 po qod till gone   3 tablet   1   . HYDROcodone-acetaminophen (NORCO/VICODIN) 5-325 MG per tablet   Oral   Take 1 tablet by mouth every 6 (six) hours as needed. For severe pain   30 tablet   0   . ibuprofen (ADVIL,MOTRIN) 800 MG tablet   Oral   Take 1 tablet (800 mg total) by mouth every 8 (eight) hours as needed for pain.   21 tablet   0   . metFORMIN (GLUCOPHAGE) 1000 MG tablet      1/2 tab twice daily x2 weeks and then increase to 1 tab twice daily   60 tablet   3   . SUMAtriptan (IMITREX) 50 MG tablet   Oral   Take 1 tablet (50 mg total) by mouth every 2 (two) hours as needed for migraine.   10 tablet   0    BP 130/90  Pulse 79  Temp(Src) 97.6 F (36.4 C)  Resp 18  SpO2 100%  Physical Exam  Nursing note and vitals reviewed. Constitutional: He is oriented to person, place, and time. He appears well-developed and well-nourished.  HENT:  Head: Normocephalic and atraumatic.  Eyes: EOM are normal. Pupils are equal, round, and reactive to light.  Neck: Normal range of motion. Neck supple.  Cardiovascular: Normal rate, normal heart sounds and intact distal pulses.   Pulmonary/Chest: Effort normal and breath sounds normal.  Abdominal: Bowel sounds are normal. He exhibits no distension. There is tenderness (epigastric). There is no guarding.  Musculoskeletal: Normal range of motion. He exhibits no edema and no tenderness.  Neurological: He is alert and oriented to person, place, and time. He has normal strength. No cranial nerve deficit or sensory deficit.  Skin: Skin is warm and dry. No rash noted.  Psychiatric: He has a normal mood and affect.    ED Course  Procedures (including critical  care time)  DIAGNOSTIC STUDIES: Oxygen Saturation is 100% on RA, normal by my interpretation.    COORDINATION OF CARE: 6:54 PM-Discussed treatment plan which includes labs, ultrasound and pain medication with pt at bedside and pt agreed to plan.   Labs Review Labs Reviewed  CBC WITH DIFFERENTIAL - Abnormal; Notable for the following:    WBC 13.6 (*)    Neutro Abs 9.9 (*)    All other components within normal limits  COMPREHENSIVE METABOLIC PANEL - Abnormal; Notable for the following:    Glucose, Bld 188 (*)    All other components within normal limits  URINALYSIS, ROUTINE W REFLEX MICROSCOPIC - Abnormal; Notable for the following:    Specific Gravity, Urine 1.033 (*)    Glucose, UA >1000 (*)    All other components within normal limits  LIPASE, BLOOD  URINE MICROSCOPIC-ADD ON   Imaging Review US Abdomen Complete  12/04/2012   *RADIOLOGY REPORT*  Clinical Data:  Epigastric pain, evaluate for biliary disease  ABDOMINAL ULTRASOUND COMPLETE  Comparison:  None.  Findings:  Gallbladder:  No gallstones, gallbladder wall thickening, or pericholecystic fluid. Per the sonographer, as sonographic Murphy's sign was negative.  Common Bile Duct:  Within normal limits in caliber at 2.4 mm.  Liver: Echogenic hepatic parenchyma with coarsening of the hepatic echotexture consistentwith at least moderate hepatic steatosis.  IVC:  Largely obscured by overlying bowel gas.  Pancreas:  Largely obscured by overlying bowel gas.  Spleen:  Within normal limits in size (7.4 cm) and echotexture.  Right kidney:  Normal in size (10.7 cm) and parenchymal echogenicity.  No evidence of mass or hydronephrosis.  Left kidney:  Normal in size (11.7 cm) and parenchymal echogenicity.  No evidence of mass or hydronephrosis.  Abdominal Aorta:  No aneurysm identified. Portions of the abdominal aorta were obscured by overlying bowel gas.  IMPRESSION:  1.  Negative for cholelithiasis or cholecystitis. 2.  Moderate hepatic steatosis.  3.  Technically difficult study secondary to large volume of obscuring bowel gas.   Original Report Authenticated By: Malachy Moan, M.D.    MDM   1. Gastritis     Labs and imaging reviewed and unremarkable. Pt initially feeling better and getting some rest but having some mild pain now. Abdomen remains benign. Suspect gastritis or PUD. Advised PPI and GI followup.      I personally performed the services described in this documentation, which was scribed in my presence. The recorded information has been reviewed and is accurate.     Fateh B. Bernette Mayers,  MD 12/04/12 2103

## 2012-12-04 NOTE — ED Notes (Signed)
MD at bedside. 

## 2012-12-04 NOTE — ED Notes (Signed)
Symptoms started 2 months ago, comes and goes. Upper abdominal epigastric spasm that has gotten worsen and constant in the past week, radiates to chest, sides.

## 2012-12-05 ENCOUNTER — Telehealth (HOSPITAL_COMMUNITY): Payer: Self-pay | Admitting: Emergency Medicine

## 2013-01-04 ENCOUNTER — Other Ambulatory Visit: Payer: Self-pay

## 2013-02-14 ENCOUNTER — Ambulatory Visit (INDEPENDENT_AMBULATORY_CARE_PROVIDER_SITE_OTHER): Payer: 59 | Admitting: Family Medicine

## 2013-02-14 ENCOUNTER — Encounter: Payer: Self-pay | Admitting: Family Medicine

## 2013-02-14 VITALS — BP 120/72 | HR 60 | Temp 97.8°F | Resp 18 | Ht 68.0 in | Wt 200.0 lb

## 2013-02-14 DIAGNOSIS — E1310 Other specified diabetes mellitus with ketoacidosis without coma: Secondary | ICD-10-CM

## 2013-02-14 DIAGNOSIS — B3749 Other urogenital candidiasis: Secondary | ICD-10-CM

## 2013-02-14 DIAGNOSIS — E1110 Type 2 diabetes mellitus with ketoacidosis without coma: Secondary | ICD-10-CM

## 2013-02-14 DIAGNOSIS — E1165 Type 2 diabetes mellitus with hyperglycemia: Secondary | ICD-10-CM

## 2013-02-14 DIAGNOSIS — B3742 Candidal balanitis: Secondary | ICD-10-CM

## 2013-02-14 LAB — BASIC METABOLIC PANEL
BUN: 11 mg/dL (ref 6–23)
CO2: 26 mEq/L (ref 19–32)
Calcium: 9.3 mg/dL (ref 8.4–10.5)
Chloride: 102 mEq/L (ref 96–112)
Creat: 0.73 mg/dL (ref 0.50–1.35)
Glucose, Bld: 226 mg/dL — ABNORMAL HIGH (ref 70–99)
Potassium: 4.6 mEq/L (ref 3.5–5.3)

## 2013-02-14 LAB — POCT GLYCOSYLATED HEMOGLOBIN (HGB A1C): Hemoglobin A1C: 9.5

## 2013-02-14 MED ORDER — GLIPIZIDE 5 MG PO TABS: 5.0000 mg | ORAL_TABLET | Freq: Two times a day (BID) | ORAL | Status: DC

## 2013-02-14 MED ORDER — FLUCONAZOLE 150 MG PO TABS: ORAL_TABLET | ORAL | Status: DC

## 2013-02-14 NOTE — Progress Notes (Signed)
[facility] [address] [place] [address] [phone]  Date:  02/14/2013   Name:  Edward [name]   DOB:  Sep 16, 1975   MRN:  [id number]  PCP:  Neena Rhymes, MD    Chief Complaint: rash on gential area   History of Present Illness:  Edward [name] is a 37 y.o. very pleasant male patient who presents with the following:  He is here today with a possible recurrence of candida balanitis for about one week.  His PCP is Dr. Beverely Low.  Admits that he is not very compliant with his DM medications.  He is using an antifungal cream but this does not seem to help.  He has a little swelling and redness of the penis.  No pain.  He is able to eat.  He is able to urinate He was treated for this in February with diflucan 150 qd for 3 days.  This was successful.    He is aware that his frequent yeast infections are likely related to poor glucose control.  He has been off his metformin for a couple of months.  It makes him feel bad so he does not take it.  Last visit with his PCP over 6 months ago.  He would be interested in trying a different medication   Patient Active Problem List   Diagnosis Date Noted  . Type II or unspecified type diabetes mellitus with neurological manifestations, not stated as uncontrolled(250.60) 04/12/2012  . Tinea pedis 04/12/2012  . Migraine, unspecified, without mention of intractable migraine without mention of status migrainosus 04/12/2012  . Soft tissue mass 04/12/2012  . Tobacco use disorder 04/12/2012    Past Medical History  Diagnosis Date  . Chicken pox   . Diabetes mellitus without complication   . Increased frequency of headaches     Past Surgical History  Procedure Laterality Date  . Incise and drain abcess      left hand and jaw    History  Substance Use Topics  . Smoking status: Current Every Day Smoker -- 1.00 packs/day    Types: Cigarettes  . Smokeless tobacco: Not on file  . Alcohol Use: Yes     Family History  Problem Relation Age of Onset  . Heart disease Mother   . Diabetes Mother   . Hyperlipidemia Father   . Stroke Father   . Hypertension Father   . Cancer Paternal Grandfather     prostate    No Known Allergies  Medication list has been reviewed and updated.  Current Outpatient Prescriptions on File Prior to Visit  Medication Sig Dispense Refill  . econazole nitrate 1 % cream Apply topically daily.  30 g  2  . clotrimazole-betamethasone (LOTRISONE) cream Apply topically 2 (two) times daily.  30 g  0  . fluconazole (DIFLUCAN) 150 MG tablet Take 1 po qod till gone  3 tablet  1  . HYDROcodone-acetaminophen (NORCO/VICODIN) 5-325 MG per tablet Take 1 tablet by mouth every 6 (six) hours as needed. For severe pain  30 tablet  0  . ibuprofen (ADVIL,MOTRIN) 800 MG tablet Take 1 tablet (800 mg total) by mouth every 8 (eight) hours as needed for pain.  21 tablet  0  . metFORMIN (GLUCOPHAGE) 1000 MG tablet 1/2 tab twice daily x2 weeks and then increase to 1 tab twice daily  60 tablet  3  . pantoprazole (PROTONIX) 40 MG tablet Take 1 tablet (40 mg total) by mouth daily.  30  tablet  0  . SUMAtriptan (IMITREX) 50 MG tablet Take 1 tablet (50 mg total) by mouth every 2 (two) hours as needed for migraine.  10 tablet  0   No current facility-administered medications on file prior to visit.    Review of Systems:  As per HPI- otherwise negative.   Physical Examination: Filed Vitals:   02/14/13 0927  BP: 120/72  Pulse: 102  Temp: 97.8 F (36.6 C)  Resp: 18   Filed Vitals:   02/14/13 0927  Height: 5\' 8"  (1.727 m)  Weight: 200 lb (90.719 kg)   Body mass index is 30.42 kg/(m^2). Ideal Body Weight: Weight in (lb) to have BMI = 25: 164.1  GEN: WDWN, NAD, Non-toxic, A & O x 3, looks well HEENT: Atraumatic, Normocephalic. Neck supple. No masses, No LAD. Ears and Nose: No external deformity. CV: RRR, No M/G/R. No JVD. No thrill. No extra heart sounds. PULM: CTA B, no  wheezes, crackles, rhonchi. No retractions. No resp. distress. No accessory muscle use. ABD: S, NT, ND, +BS. No rebound. No HSM. EXTR: No c/c/e NEURO Normal gait.  PSYCH: Normally interactive. Conversant. Not depressed or anxious appearing.  Calm demeanor.  GU:  Mild edema and redness of the foreskin, mild redness of the glans.  No discharge or lesions.  Consistent with candida balanitis  Results for orders placed in visit on 02/14/13  POCT GLYCOSYLATED HEMOGLOBIN (HGB A1C)      Result Value Range   Hemoglobin A1C 9.5     Assessment and Plan: Candidal balanitis - Plan: fluconazole (DIFLUCAN) 150 MG tablet  Type II or unspecified type diabetes mellitus with unspecified complication, uncontrolled - Plan: POCT glycosylated hemoglobin (Hb A1C), Basic metabolic panel, glipiZIDE (GLUCOTROL) 5 MG tablet  DM (diabetes mellitus) type 2, uncontrolled, with ketoacidosis - Plan: fluconazole (DIFLUCAN) 150 MG tablet  Treat with diflucan QOD for 3 doses as has worked for him in the past.   Will try glipizide for his DM.   See patient instructions for more details.   Plan to recheck his DM in about 6 weeks.    Signed Abbe Amsterdam, MD

## 2013-02-14 NOTE — Patient Instructions (Signed)
Take a diflucan every other day for 3 doses. Start the glipizide for your diabetes.  Take a 1/2 tab daily while you are on the diflucan.  Then go to a full tablet.   I will be in touch with the rest of your labs.  Let me know if you are not feeling better soon!

## 2013-02-27 ENCOUNTER — Ambulatory Visit (INDEPENDENT_AMBULATORY_CARE_PROVIDER_SITE_OTHER): Payer: 59 | Admitting: Physician Assistant

## 2013-02-27 VITALS — BP 134/82 | HR 86 | Temp 98.1°F | Resp 18 | Ht 67.5 in | Wt 198.0 lb

## 2013-02-27 DIAGNOSIS — S335XXA Sprain of ligaments of lumbar spine, initial encounter: Secondary | ICD-10-CM

## 2013-02-27 DIAGNOSIS — Z23 Encounter for immunization: Secondary | ICD-10-CM

## 2013-02-27 DIAGNOSIS — S39012A Strain of muscle, fascia and tendon of lower back, initial encounter: Secondary | ICD-10-CM

## 2013-02-27 DIAGNOSIS — M545 Low back pain: Secondary | ICD-10-CM

## 2013-02-27 DIAGNOSIS — G4700 Insomnia, unspecified: Secondary | ICD-10-CM

## 2013-02-27 MED ORDER — ZOLPIDEM TARTRATE 5 MG PO TABS: 5.0000 mg | ORAL_TABLET | Freq: Every evening | ORAL | Status: DC | PRN

## 2013-02-27 MED ORDER — MELOXICAM 15 MG PO TABS: 15.0000 mg | ORAL_TABLET | Freq: Every day | ORAL | Status: DC

## 2013-02-27 MED ORDER — CYCLOBENZAPRINE HCL 10 MG PO TABS: 10.0000 mg | ORAL_TABLET | Freq: Three times a day (TID) | ORAL | Status: DC | PRN

## 2013-02-27 NOTE — Patient Instructions (Signed)
Get lots of rest, but don't stay in bed.  Move gently, and be kind to your body. Drink plenty of liquids. Try a warm compress to the area for 20-30 minutes 2-3 times each day.
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
## 2013-10-06 ENCOUNTER — Encounter: Payer: 59 | Attending: Family Medicine

## 2013-10-06 VITALS — Ht 69.0 in | Wt 192.2 lb

## 2013-10-06 DIAGNOSIS — Z713 Dietary counseling and surveillance: Secondary | ICD-10-CM | POA: Diagnosis present

## 2013-10-06 DIAGNOSIS — E119 Type 2 diabetes mellitus without complications: Secondary | ICD-10-CM | POA: Diagnosis present

## 2013-10-06 NOTE — Progress Notes (Signed)
Patient was seen on 10/06/13 for the complete diabetes self-management series at [facility]. This is a part of the [facility] [place] has admitted to being grossly noncompliant with his T2DM management. He was initially prescribed Metformin 547m daily. It took it for a short time and c/o GI upset. He stopped the medication. He was then seen at [facility] for another condition. He discussed his diabetes and was provided with a RX for a sulfonylurea which he took also for a short time. He has eliminated regular soda and some other carbohydrate products and lost some weight. He notes that he continually does not feel well. He skips breakfast. He does not test his glucose.  Meter Given: Accu Chek Aviva Plus BG Monitoring Kit ... We will request an order for testing supplies Lot: [id number]: 07/30/14 Today: 2hpp 2043mdl  Current A1c = 9.5% on 02/14/13  Handouts given during class include:  Living Well with Diabetes book  Carb Counting and Meal Planning book  Meal Plan Card  Carbohydrate guide  Meal planning worksheet  Low Sodium Flavoring Tips  The diabetes portion plate  Low Carbohydrate Snack Suggestions  A1c to eAG Conversion Chart  Diabetes Medications  Stress Management  Diabetes Recommended Care Schedule  Diabetes Success Plan  Core Class Satisfaction Survey  The following learning objectives were met by the patient during this course:  Describe diabetes  State some common risk factors for diabetes  Defines the role of glucose and insulin  Identifies type of diabetes and pathophysiology  Describe the relationship between diabetes and cardiovascular risk  State the members of the Healthcare Team  States the rationale for glucose monitoring  State when to test glucose  State their individual Target Range  State the importance of logging glucose readings  Describe how to interpret glucose  readings  Identifies A1C target  Explain the correlation between A1c and eAG values  State symptoms and treatment of high blood glucose  State symptoms and treatment of low blood glucose  Explain proper technique for glucose testing  Identifies proper sharps disposal  Describe the role of different macronutrients on glucose  Explain how carbohydrates affect blood glucose  State what foods contain the most carbohydrates  Demonstrate carbohydrate counting  Demonstrate how to read Nutrition Facts food label  Describe effects of various fats on heart health  Describe the importance of good nutrition for health and healthy eating strategies  Describe techniques for managing your shopping, cooking and meal planning  List strategies to follow meal plan when dining out  Describe the effects of alcohol on glucose and how to use it safely   State the amount of activity recommended for healthy living   Describe activities suitable for individual needs   Identify ways to regularly incorporate activity into daily life   Identify barriers to activity and ways to over come these barriers  Identify diabetes medications being personally used and their primary action for lowering glucose and possible side effects   Describe role of stress on blood glucose and develop strategies to address psychosocial issues   Identify diabetes complications and ways to prevent them  Explain how to manage diabetes during illness   Evaluate success in meeting personal goal   Establish 2-3 goals that they will plan to diligently work on until they return for the  4-75-monthllow-up visit  Goals:  Follow Diabetes Meal Plan as instructed  Eat 3 meals and 2 snacks, every 3-5  hrs  Limit carbohydrate intake to 45 grams carbohydrate/meal Limit carbohydrate intake to 15 grams carbohydrate/snack Add lean protein foods to meals/snacks  Monitor glucose levels as instructed by your doctor  Aim for 30 mins of  physical activity daily as tolerated  Bring food record and glucose log to all healthcare visits  Your patient has established the following 4 month goals in their individualized success plan: I will count my carb choices at most meals and snacks I will take my diabetes medications as scheduled  Your patient has identified these potential barriers to change:  Motivation  Your patient has identified their diabetes self-care support plan as  [place] Support Group  American Diabetes Association web site  Family support  Plan: Follow up with [facility] Coordinator 10/09/13 _0 :30 Resume Metformin 558m with dinner meal Test glucose FBS and 2hpp dinner alternating days. Email me with glucose reading in one week. Schedule appointment with Dr. TBirdie Riddlefor f/u of T2DM

## 2013-10-18 ENCOUNTER — Ambulatory Visit: Payer: Self-pay | Admitting: Family Medicine

## 2013-10-18 DIAGNOSIS — Z0289 Encounter for other administrative examinations: Secondary | ICD-10-CM

## 2013-10-30 ENCOUNTER — Emergency Department (HOSPITAL_COMMUNITY)
Admission: EM | Admit: 2013-10-30 | Discharge: 2013-10-30 | Disposition: A | Discharge status: Home / Self Care | Payer: 59 | Attending: Emergency Medicine | Admitting: Emergency Medicine

## 2013-10-30 ENCOUNTER — Encounter (HOSPITAL_COMMUNITY): Payer: Self-pay | Admitting: Emergency Medicine

## 2013-10-30 DIAGNOSIS — IMO0002 Reserved for concepts with insufficient information to code with codable children: Secondary | ICD-10-CM | POA: Insufficient documentation

## 2013-10-30 DIAGNOSIS — Z79899 Other long term (current) drug therapy: Secondary | ICD-10-CM | POA: Diagnosis not present

## 2013-10-30 DIAGNOSIS — Y9389 Activity, other specified: Secondary | ICD-10-CM | POA: Insufficient documentation

## 2013-10-30 DIAGNOSIS — Y9289 Other specified places as the place of occurrence of the external cause: Secondary | ICD-10-CM | POA: Diagnosis not present

## 2013-10-30 DIAGNOSIS — W2209XA Striking against other stationary object, initial encounter: Secondary | ICD-10-CM | POA: Insufficient documentation

## 2013-10-30 DIAGNOSIS — F17200 Nicotine dependence, unspecified, uncomplicated: Secondary | ICD-10-CM | POA: Diagnosis not present

## 2013-10-30 DIAGNOSIS — E119 Type 2 diabetes mellitus without complications: Secondary | ICD-10-CM | POA: Insufficient documentation

## 2013-10-30 DIAGNOSIS — L03119 Cellulitis of unspecified part of limb: Secondary | ICD-10-CM | POA: Diagnosis not present

## 2013-10-30 DIAGNOSIS — L02419 Cutaneous abscess of limb, unspecified: Secondary | ICD-10-CM | POA: Insufficient documentation

## 2013-10-30 DIAGNOSIS — Z8619 Personal history of other infectious and parasitic diseases: Secondary | ICD-10-CM | POA: Diagnosis not present

## 2013-10-30 DIAGNOSIS — M79609 Pain in unspecified limb: Secondary | ICD-10-CM

## 2013-10-30 DIAGNOSIS — L03116 Cellulitis of left lower limb: Secondary | ICD-10-CM

## 2013-10-30 MED ORDER — CLINDAMYCIN HCL 150 MG PO CAPS: 300.0000 mg | ORAL_CAPSULE | Freq: Four times a day (QID) | ORAL | Status: DC

## 2013-10-30 MED ORDER — HYDROCODONE-ACETAMINOPHEN 5-325 MG PO TABS
1.0000 | ORAL_TABLET | Freq: Once | ORAL | Status: AC
  Administered 2013-10-30: 1 via ORAL
  Filled 2013-10-30: qty 1

## 2013-10-30 MED ORDER — CLINDAMYCIN PHOSPHATE 600 MG/50ML IV SOLN
600.0000 mg | Freq: Once | INTRAVENOUS | Status: AC
  Administered 2013-10-30: 600 mg via INTRAVENOUS
  Filled 2013-10-30: qty 50

## 2013-10-30 MED ORDER — HYDROCODONE-ACETAMINOPHEN 5-325 MG PO TABS
1.0000 | ORAL_TABLET | Freq: Four times a day (QID) | ORAL | Status: DC | PRN
Start: 2013-10-30 — End: 2013-11-16

## 2013-10-30 NOTE — ED Provider Notes (Addendum)
CSN: [id number]     Arrival date & time 10/30/13  0751 History   First MD Initiated Contact with Patient 10/30/13 [phone]     Chief Complaint  Patient presents with  . Leg Pain     (Consider location/radiation/quality/duration/timing/severity/associated sxs/prior Treatment) HPI  This is a 38 year old male with a history of diabetes who presents with left leg injury. Patient reports that he hit his left shin on a bed approximately one week ago. The last 2-3 days he is noted increased swelling, warmth, and pain to the leg. He denies any fevers or systemic symptoms. He is concerned for possible infection.  Currently his pain is 6/10.  It is nonradiating. Worse with ambulation.  Past Medical History  Diagnosis Date  . Chicken pox   . Diabetes mellitus without complication   . Increased frequency of headaches    Past Surgical History  Procedure Laterality Date  . Incise and drain abcess      left hand and jaw   Family History  Problem Relation Age of Onset  . Heart disease Mother   . Diabetes Mother   . Hyperlipidemia Father   . Stroke Father   . Hypertension Father   . Cancer Paternal Grandfather     prostate   History  Substance Use Topics  . Smoking status: Current Every Day Smoker -- 1.00 packs/day    Types: Cigarettes  . Smokeless tobacco: Never Used  . Alcohol Use: Yes    Review of Systems  Constitutional: Negative for fever.  Respiratory: Negative for chest tightness and shortness of breath.   Musculoskeletal:       Left leg pain  Skin: Positive for color change and wound.  All other systems reviewed and are negative.     Allergies  Review of patient's allergies indicates no known allergies.  Home Medications   Prior to Admission medications   Medication Sig Start Date End Date Taking? Authorizing Provider  glipiZIDE (GLUCOTROL) 5 MG tablet Take 1 tablet (5 mg total) by mouth 2 (two) times daily before a meal. 02/14/13  Yes Gwenlyn Found Copland, MD   SUMAtriptan (IMITREX) 50 MG tablet Take 1 tablet (50 mg total) by mouth every 2 (two) hours as needed for migraine. 04/12/12  Yes Sheliah Hatch, MD  zolpidem (AMBIEN) 5 MG tablet Take 1 tablet (5 mg total) by mouth at bedtime as needed for sleep. 02/27/13  Yes Chelle S Jeffery, PA-C  clindamycin (CLEOCIN) 150 MG capsule Take 2 capsules (300 mg total) by mouth 4 (four) times daily. 10/30/13   Shon Baton, MD  HYDROcodone-acetaminophen (NORCO/VICODIN) 5-325 MG per tablet Take 1 tablet by mouth every 6 (six) hours as needed for moderate pain or severe pain. 10/30/13   Shon Baton, MD   BP 126/83  Pulse 68  Temp(Src) 98.6 F (37 C) (Oral)  Resp 16  SpO2 99% Physical Exam  Nursing note and vitals reviewed. Constitutional: He is oriented to person, place, and time. He appears well-developed and well-nourished.  HENT:  Head: Normocephalic and atraumatic.  Cardiovascular: Normal rate and regular rhythm.   Pulmonary/Chest: Effort normal. No respiratory distress.  Musculoskeletal:  There is tenderness palpation of the left mid anterior shin associated abrasion. There is redness and warmth extending approximately 4 cm around the wound. No obvious fluctuance. 1+ edema of the left calf and shin which is asymmetric when compared to the right  Neurological: He is alert and oriented to person, place, and time.  Skin: Skin is  warm and dry.  Psychiatric: He has a normal mood and affect.    ED Course  Procedures (including critical care time) Labs Review Labs Reviewed - No data to display  Imaging Review No results found.   EKG Interpretation None      MDM   Final diagnoses:  Cellulitis of left lower extremity    Patient presents with abrasions evidence of cellulitis to the left lower extremity. Also with asymmetric swelling. Given injury, will obtain ultrasound to rule out DVT. No evidence of abscess. Patient was given one dose of clindamycin in the ER. Ultrasound without  evidence of DVT. Patient will be discharged with a seven-day course of clindamycin. He was given return precautions including worsening redness or fevers.  After history, exam, and medical workup I feel the patient has been appropriately medically screened and is safe for discharge home. Pertinent diagnoses were discussed with the patient. Patient was given return precautions.     Shon Baton, MD 10/30/13 1023  Of note, patient's CBG 275. Has been off of diabetes medications for years. He states that he has recently been evaluated and is working on getting placed back on metformin. At this level, do not feel he needs further lab work; however, have discussed with patient that he needs to keep a close eye on his blood sugars at home. He also needs to followup closely with his primary physician to be placed back on diabetes medication. Patient stated understanding.  Shon Baton, MD 10/30/13 1037

## 2013-10-30 NOTE — ED Notes (Signed)
Pt hit shin on bed 1 week ago and now has red swollen area to left shin that looks infected. Reports pain on palpation with some edema. Pt ambulatory.

## 2013-10-30 NOTE — Progress Notes (Signed)
*  PRELIMINARY RESULTS* Vascular Ultrasound Left lower extremity venous duplex has been completed.  Preliminary findings: no evidence of DVT or superficial thrombus. Enlarged left inguinal lymph node noted.  Farrel Demark, RDMS, RVT  10/30/2013, 10:14 AM

## 2013-10-30 NOTE — Discharge Instructions (Signed)

## 2013-10-31 LAB — CBG MONITORING, ED: GLUCOSE-CAPILLARY: 275 mg/dL — AB (ref 70–99)

## 2013-11-16 ENCOUNTER — Telehealth: Payer: Self-pay

## 2013-11-16 ENCOUNTER — Ambulatory Visit (INDEPENDENT_AMBULATORY_CARE_PROVIDER_SITE_OTHER): Payer: 59 | Admitting: Internal Medicine

## 2013-11-16 VITALS — BP 120/86 | HR 77 | Temp 98.0°F | Resp 18 | Ht 67.75 in | Wt 192.8 lb

## 2013-11-16 DIAGNOSIS — M79604 Pain in right leg: Secondary | ICD-10-CM

## 2013-11-16 DIAGNOSIS — IMO0001 Reserved for inherently not codable concepts without codable children: Secondary | ICD-10-CM

## 2013-11-16 DIAGNOSIS — G8929 Other chronic pain: Secondary | ICD-10-CM

## 2013-11-16 DIAGNOSIS — E1165 Type 2 diabetes mellitus with hyperglycemia: Secondary | ICD-10-CM

## 2013-11-16 DIAGNOSIS — F17200 Nicotine dependence, unspecified, uncomplicated: Secondary | ICD-10-CM

## 2013-11-16 DIAGNOSIS — M79609 Pain in unspecified limb: Secondary | ICD-10-CM

## 2013-11-16 DIAGNOSIS — IMO0002 Reserved for concepts with insufficient information to code with codable children: Secondary | ICD-10-CM

## 2013-11-16 MED ORDER — HYDROCODONE-ACETAMINOPHEN 5-325 MG PO TABS: 1.0000 | ORAL_TABLET | Freq: Four times a day (QID) | ORAL | Status: DC | PRN

## 2013-11-16 MED ORDER — METFORMIN HCL ER (MOD) 500 MG PO TB24: 500.0000 mg | ORAL_TABLET | Freq: Every day | ORAL | Status: DC

## 2013-11-16 NOTE — Telephone Encounter (Signed)
Patient went to pick up his prescriptions and his Metformin he was told by the pharmacy with insurance would cost him $180. Per patient he always gets "Metformin HCL PO 500 mg" and "Glumetza" is what was called in. Patient is requesting if we could contact [facility] and change it. Patients call back number is [phone]

## 2013-11-16 NOTE — Progress Notes (Signed)
Subjective:    Patient ID: Edward [name], male    DOB: [birth year], 38 y.o.   MRN: [id number] This chart was scribed for Safire Gordin P. Merla Riches, MD by Chestine Spore, ED Scribe. The patient was seen in room 14 at 11:12 AM.   Chief Complaint  Patient presents with  . Diabetes    pt is here for a diabetes check  . other    pt states he would like to have an referral within the [facility] network to an pain management clinic  . other    pt states he would like to have meds (Hydrocodone) for an week until he is seen at the pain management clinic    HPI Edward [name] is a 38 y.o. male with a medical hx of Type II DM who presents today complaining of DM check, referral to a pain management clinic, and Hydrocodone refill. He has been in treatment for about a year for chronic pain and preferred pain management with Dr. Jordan Likes. He states that last month [facility] wanted an extra 150 dollars plus a co-pay. He states that he has not been to the clinic within the [facility] network . He voices concerns for if he can get a referral for a [facility]. He states that he has not had his Hydrocodone in a week.  He states that he is on chronic pain medications because he has a cyst on his muscle of his lower leg.   He states that he was using Metformin and it made him nauseated so he was changed to glipizide. He's been off all medications for several months but recently was taken for an evaluation within [employer] and wellness. The nutritionist advised him to switch back to metformin and ask [place] to prescribe it for him since he can't get in to see his regular primary care provider Dr. Beverely Low. He denies any complication with his blood sugar. He states that when he checks his blood sugar it is typically between 200-250.He states that he had an A1C completed [facility] and it was 9.1 last week that was not recorded. He states that the pain medication eases the pain. He denies any other  associated symptoms. He states that his mom and dad both have DM.   He states that he is a current smoker. 1ppd since teens  He states that he smokes 1 PPD.  He states that he had an infection of his left leg and went to the ED and was treated with Clindamycin for 2 weeks. Still has a swollen area.  Patient Active Problem List   Diagnosis Date Noted  . Type II or unspecified type diabetes mellitus with neurological manifestations, not stated as uncontrolled(250.60)----don't see any evidence of neurological manifestations in his history are in the chart  04/12/2012  . Tinea pedis 04/12/2012  . Migraine, unspecified, without mention of intractable migraine without mention of status migrainosus 04/12/2012  . Soft tissue mass 04/12/2012  . Tobacco use disorder 04/12/2012   Past Medical History  Diagnosis Date  . Chicken pox   . Diabetes mellitus without complication   . Increased frequency of headaches    Past Surgical History  Procedure Laterality Date  . Incise and drain abcess      left hand and jaw   No Known Allergies Medication Sig Start Date End Date Taking?  HYDROcodone-acetaminophen (NORCO/VICODIN) 5-325 MG per tablet Take 1 tablet by mouth every 6 (six) hours as needed for moderate  pain or severe pain. 10/30/13  Yes  METFORMIN HCL PO Take by mouth.  he has been off medication   Yes  clindamycin (CLEOCIN) 150 MG capsule Take 2 capsules (300 mg total) by mouth 4 (four) times daily. 10/30/13  recent treatment for cellulitis of lower extremity in the area of contact contusion    SUMAtriptan (IMITREX) 50 MG tablet Take 1 tablet (50 mg total) by mouth every 2 (two) hours as needed for migraine. 04/12/12        Review of Systems    Noncontributory.   Objective:   Physical Exam  Nursing note and vitals reviewed. Constitutional: He is oriented to person, place, and time. He appears well-developed and well-nourished. No distress.  HENT:  Head: Normocephalic and atraumatic.  Eyes:  EOM are normal.  Neck: Neck supple.  Cardiovascular: Normal rate.   Pulmonary/Chest: Effort normal. No respiratory distress.  Musculoskeletal: Normal range of motion. He exhibits tenderness.  1.5 cm firm tender mass in the right gastroc. Swelling with redness anterior tibia on the left without signs of infection.   Neurological: He is alert and oriented to person, place, and time.  Skin: Skin is warm and dry.  Psychiatric: He has a normal mood and affect. His behavior is normal.         BP 120/86  Pulse 77  Temp(Src) 98 F (36.7 C) (Oral)  Resp 18  Ht 5' 7.75" (1.721 m)  Wt 192 lb 12.8 oz (87.454 kg)  BMI 29.53 kg/m2  SpO2 99%  Assessment & Plan:  I personally performed the services described in this documentation, which was scribed in my presence. The recorded information has been reviewed and is accurate.   DM (diabetes mellitus), type 2, uncontrolled  Chronic pain of right lower extremity - Plan: Ambulatory referral to Pain Clinic  He will get outside records from referred pain management  Tobacco use disorder  Discussed gradual cessation method starting 10 cigarettes per day  Meds ordered this encounter  Medications         . HYDROcodone-acetaminophen (NORCO/VICODIN) 5-325 MG per tablet    Sig: Take 1 tablet by mouth every 6 (six) hours as needed for moderate pain or severe pain.    Dispense:  120 tablet    Refill:  0  . metFORMIN (GLUMETZA) 500 MG (MOD) 24 hr tablet    Sig: Take 1 tablet (500 mg total) by mouth daily with breakfast.    Dispense:  90 tablet    Refill:  1

## 2013-11-16 NOTE — Patient Instructions (Signed)
Wt Readings from Last 3 Encounters:  11/16/13 192 lb 12.8 oz (87.454 kg)  10/06/13 192 lb 3.2 oz (87.181 kg)  02/27/13 198 lb (89.812 kg)

## 2013-11-17 NOTE — Telephone Encounter (Signed)
Ok to change

## 2013-11-19 MED ORDER — METFORMIN HCL ER (MOD) 500 MG PO TB24: 500.0000 mg | ORAL_TABLET | Freq: Every day | ORAL | Status: DC

## 2013-11-19 NOTE — Telephone Encounter (Signed)
Meds ordered this encounter  Medications  . metFORMIN (GLUMETZA) 500 MG (MOD) 24 hr tablet    Sig: Take 1 tablet (500 mg total) by mouth daily with breakfast.    Dispense:  90 tablet    Refill:  1

## 2013-11-19 NOTE — Telephone Encounter (Signed)
Pt.notified

## 2013-11-21 ENCOUNTER — Telehealth: Payer: Self-pay | Admitting: *Deleted

## 2013-11-21 NOTE — Telephone Encounter (Signed)
Left message in voice mail to call back to make appointment to follow up on diabetes.

## 2013-12-03 ENCOUNTER — Telehealth: Payer: Self-pay

## 2013-12-03 NOTE — Telephone Encounter (Signed)
Pt had LM on my VM that he needed us to call pharm and have them fill his metformin Rx w/a less expensive brand. Called pharm and did this and notified pt on VM.

## 2013-12-11 ENCOUNTER — Encounter: Payer: Self-pay | Admitting: Internal Medicine

## 2013-12-11 DIAGNOSIS — G8929 Other chronic pain: Secondary | ICD-10-CM

## 2013-12-11 DIAGNOSIS — M79606 Pain in leg, unspecified: Principal | ICD-10-CM

## 2013-12-12 ENCOUNTER — Telehealth: Payer: Self-pay

## 2013-12-12 NOTE — Telephone Encounter (Signed)
Dr Merla Richesoolittle-- Do you want to refill pt's hydrocodone?

## 2013-12-12 NOTE — Telephone Encounter (Signed)
He will need an office visit for each refill monthly until established with pain management

## 2013-12-12 NOTE — Telephone Encounter (Signed)
Patient states the pain medicine office normally prescribes him Hyrdocodone 10, however, they are starting to charge the patient $140 for each urinalysis and he is unable to afford this. As a result, Dr Merla Richesoolittle prescribed him the Hydrocodone 5 and he has to "double up" on some days. He says he has enough to last him another day or 2. [facility] so it would be more cost efficient to the patient, however, they have a lot of referrals and have not got to his referral yet. Patient would like a refill of the Hydrocodone either 5 or 10 until his appointment is scheduled.

## 2013-12-13 NOTE — Telephone Encounter (Signed)
Left message to c/b.

## 2013-12-13 NOTE — Telephone Encounter (Signed)
Patient stated he was returning a call to our office. I informed patient Dr Merla Richesoolittle was requesting he come in to be seen prior to authorizing anymore refills on "Hydrocodone". Patient stated he would come in this weekend. I informed him Dr Merla Richesoolittle would not be here and he said he was fine to see whom ever.

## 2013-12-13 NOTE — Telephone Encounter (Signed)
Called patient to advise he must be seen in the office, left message for him to call back, so I can advise.

## 2013-12-16 ENCOUNTER — Ambulatory Visit (INDEPENDENT_AMBULATORY_CARE_PROVIDER_SITE_OTHER): Payer: 59 | Admitting: Internal Medicine

## 2013-12-16 VITALS — BP 116/79 | HR 86 | Temp 98.5°F | Ht 68.0 in | Wt 194.0 lb

## 2013-12-16 DIAGNOSIS — E1165 Type 2 diabetes mellitus with hyperglycemia: Secondary | ICD-10-CM

## 2013-12-16 DIAGNOSIS — G8929 Other chronic pain: Secondary | ICD-10-CM

## 2013-12-16 DIAGNOSIS — Z7189 Other specified counseling: Secondary | ICD-10-CM

## 2013-12-16 DIAGNOSIS — M79604 Pain in right leg: Secondary | ICD-10-CM

## 2013-12-16 DIAGNOSIS — IMO0002 Reserved for concepts with insufficient information to code with codable children: Secondary | ICD-10-CM

## 2013-12-16 LAB — GLUCOSE, POCT (MANUAL RESULT ENTRY): POC GLUCOSE: 165 mg/dL — AB (ref 70–99)

## 2013-12-16 MED ORDER — HYDROCODONE-ACETAMINOPHEN 5-325 MG PO TABS: 1.0000 | ORAL_TABLET | Freq: Four times a day (QID) | ORAL | Status: DC | PRN

## 2013-12-16 MED ORDER — GLUCOSE BLOOD VI STRP: ORAL_STRIP | Status: DC

## 2013-12-16 NOTE — Patient Instructions (Addendum)
Chronic Pain Chronic pain can be defined as pain that is off and on and lasts for 3-6 months or longer. Many things cause chronic pain, which can make it difficult to make a diagnosis. There are many treatment options available for chronic pain. However, finding a treatment that works well for you may require trying various approaches until the right one is found. Many people benefit from a combination of two or more types of treatment to control their pain. SYMPTOMS  Chronic pain can occur anywhere in the body and can range from mild to very severe. Some types of chronic pain include:  Headache.  Low back pain.  Cancer pain.  Arthritis pain.  Neurogenic pain. This is pain resulting from damage to nerves. People with chronic pain may also have other symptoms such as:  Depression.  Anger.  Insomnia.  Anxiety. DIAGNOSIS  Your health care provider will help diagnose your condition over time. In many cases, the initial focus will be on excluding possible conditions that could be causing the pain. Depending on your symptoms, your health care provider may order tests to diagnose your condition. Some of these tests may include:   Blood tests.   CT scan.   MRI.   X-rays.   Ultrasounds.   Nerve conduction studies.  You may need to see a specialist.  TREATMENT  Finding treatment that works well may take time. You may be referred to a pain specialist. He or she may prescribe medicine or therapies, such as:   Mindful meditation or yoga.  Shots (injections) of numbing or pain-relieving medicines into the spine or area of pain.  Local electrical stimulation.  Acupuncture.   Massage therapy.   Aroma, color, light, or sound therapy.   Biofeedback.   Working with a physical therapist to keep from getting stiff.   Regular, gentle exercise.   Cognitive or behavioral therapy.   Group support.  Sometimes, surgery may be recommended.  HOME CARE INSTRUCTIONS    Take all medicines as directed by your health care provider.   Lessen stress in your life by relaxing and doing things such as listening to calming music.   Exercise or be active as directed by your health care provider.   Eat a healthy diet and include things such as vegetables, fruits, fish, and lean meats in your diet.   Keep all follow-up appointments with your health care provider.   Attend a support group with others suffering from chronic pain. SEEK MEDICAL CARE IF:   Your pain gets worse.   You develop a new pain that was not there before.   You cannot tolerate medicines given to you by your health care provider.   You have new symptoms since your last visit with your health care provider.  SEEK IMMEDIATE MEDICAL CARE IF:   You feel weak.   You have decreased sensation or numbness.   You lose control of bowel or bladder function.   Your pain suddenly gets much worse.   You develop shaking.  You develop chills.  You develop confusion.  You develop chest pain.  You develop shortness of breath.  MAKE SURE YOU:  Understand these instructions.  Will watch your condition.  Will get help right away if you are not doing well or get worse. Document Released: 11/07/2001 Document Revised: 10/18/2012 Document Reviewed: 08/11/2012 [facility] Patient Information 2015 [place], [place]. This information is not intended to replace advice given to you by your health care provider. Make sure you discuss any  questions you have with your health care provider. Diabetes and Exercise Exercising regularly is important. It is not just about losing weight. It has many health benefits, such as:  Improving your overall fitness, flexibility, and endurance.  Increasing your bone density.  Helping with weight control.  Decreasing your body fat.  Increasing your muscle strength.  Reducing stress and tension.  Improving your overall health. People with diabetes  who exercise gain additional benefits because exercise:  Reduces appetite.  Improves the body's use of blood sugar (glucose).  Helps lower or control blood glucose.  Decreases blood pressure.  Helps control blood lipids (such as cholesterol and triglycerides).  Improves the body's use of the hormone insulin by:  Increasing the body's insulin sensitivity.  Reducing the body's insulin needs.  Decreases the risk for heart disease because exercising:  Lowers cholesterol and triglycerides levels.  Increases the levels of good cholesterol (such as high-density lipoproteins [HDL]) in the body.  Lowers blood glucose levels. YOUR ACTIVITY PLAN  Choose an activity that you enjoy and set realistic goals. Your health care provider or diabetes educator can help you make an activity plan that works for you. Exercise regularly as directed by your health care provider. This includes:  Performing resistance training twice a week such as push-ups, sit-ups, lifting weights, or using resistance bands.  Performing 150 minutes of cardio exercises each week such as walking, running, or playing sports.  Staying active and spending no more than 90 minutes at one time being inactive. Even short bursts of exercise are good for you. Three 10-minute sessions spread throughout the day are just as beneficial as a single 30-minute session. Some exercise ideas include:  Taking the dog for a walk.  Taking the stairs instead of the elevator.  Dancing to your favorite song.  Doing an exercise video.  Doing your favorite exercise with a friend. RECOMMENDATIONS FOR EXERCISING WITH TYPE 1 OR TYPE 2 DIABETES   Check your blood glucose before exercising. If blood glucose levels are greater than 240 mg/dL, check for urine ketones. Do not exercise if ketones are present.  Avoid injecting insulin into areas of the body that are going to be exercised. For example, avoid injecting insulin into:  The arms when  playing tennis.  The legs when jogging.  Keep a record of:  Food intake before and after you exercise.  Expected peak times of insulin action.  Blood glucose levels before and after you exercise.  The type and amount of exercise you have done.  Review your records with your health care provider. Your health care provider will help you to develop guidelines for adjusting food intake and insulin amounts before and after exercising.  If you take insulin or oral hypoglycemic agents, watch for signs and symptoms of hypoglycemia. They include:  Dizziness.  Shaking.  Sweating.  Chills.  Confusion.  Drink plenty of water while you exercise to prevent dehydration or heat stroke. Body water is lost during exercise and must be replaced.  Talk to your health care provider before starting an exercise program to make sure it is safe for you. Remember, almost any type of activity is better than none. Document Released: 05/08/2003 Document Revised: 07/02/2013 Document Reviewed: 07/25/2012 [facility] Patient Information 2015 [place]. This information is not intended to replace advice given to you by your health care provider. Make sure you discuss any questions you have with your health care provider.

## 2013-12-16 NOTE — Progress Notes (Signed)
   Subjective:    Patient ID: Edward [name], male    DOB: 04/02/1975, 38 y.o.   MRN: [id number]  HPI 38 year old male here for refills. He needs diabetic test strips for his meter. About 2 months ago pain clinic stopped taking UMR so his co pay has went up to $200. He is requesting refill for Hydrocodone. Dr[facility], explained chronic pain meds refilled only by your primary here. Agreed to give 2 weeks of med  States has not been taking metformin as ordered and home glucose was 340 yesterday. He under stands he needs to tke metformin and to lose weight and quit smoking.    Review of Systems     Objective:   Physical Exam  Constitutional: He is oriented to person, place, and time. He appears well-developed and well-nourished. No distress.  HENT:  Head: Normocephalic.  Eyes: EOM are normal.  Neck: Normal range of motion.  Cardiovascular: Normal rate, regular rhythm and normal heart sounds.   Pulmonary/Chest: Effort normal and breath sounds normal.  Musculoskeletal: He exhibits edema and tenderness.       Right lower leg: He exhibits swelling and deformity.       Legs: Nodular mass mid calf, painful  Neurological: He is alert and oriented to person, place, and time. He exhibits normal muscle tone. Coordination normal.  Psychiatric: He has a normal mood and affect. His behavior is normal. Judgment and thought content normal.          Assessment & Plan:  RF glucose test strips RF 2 weeks of HC till sees Dr. Merla Richesoolittle

## 2013-12-17 ENCOUNTER — Other Ambulatory Visit: Payer: Self-pay | Admitting: Internal Medicine

## 2014-01-01 ENCOUNTER — Ambulatory Visit (INDEPENDENT_AMBULATORY_CARE_PROVIDER_SITE_OTHER): Payer: 59 | Admitting: Internal Medicine

## 2014-01-01 VITALS — BP 98/82 | HR 82 | Temp 98.4°F | Resp 18 | Ht 68.0 in | Wt 195.0 lb

## 2014-01-01 DIAGNOSIS — E1165 Type 2 diabetes mellitus with hyperglycemia: Secondary | ICD-10-CM

## 2014-01-01 DIAGNOSIS — M79604 Pain in right leg: Secondary | ICD-10-CM

## 2014-01-01 DIAGNOSIS — F17200 Nicotine dependence, unspecified, uncomplicated: Secondary | ICD-10-CM

## 2014-01-01 DIAGNOSIS — G8929 Other chronic pain: Secondary | ICD-10-CM

## 2014-01-01 DIAGNOSIS — Z23 Encounter for immunization: Secondary | ICD-10-CM

## 2014-01-01 DIAGNOSIS — IMO0002 Reserved for concepts with insufficient information to code with codable children: Secondary | ICD-10-CM

## 2014-01-01 DIAGNOSIS — Z720 Tobacco use: Secondary | ICD-10-CM

## 2014-01-01 DIAGNOSIS — B3742 Candidal balanitis: Secondary | ICD-10-CM

## 2014-01-01 MED ORDER — HYDROCODONE-ACETAMINOPHEN 5-325 MG PO TABS: 1.0000 | ORAL_TABLET | Freq: Four times a day (QID) | ORAL | Status: DC | PRN

## 2014-01-01 MED ORDER — FLUCONAZOLE 150 MG PO TABS: 150.0000 mg | ORAL_TABLET | Freq: Once | ORAL | Status: DC

## 2014-01-01 MED ORDER — METFORMIN HCL 500 MG PO TABS: 500.0000 mg | ORAL_TABLET | Freq: Two times a day (BID) | ORAL | Status: DC

## 2014-01-01 NOTE — Progress Notes (Addendum)
Subjective:    Patient ID: Edward [name], male    DOB: 01/26/1976, 38 y.o.   MRN: [id number] This chart was scribed for Edward Siaobert Tamsyn Owusu, MD by Jolene Provostobert Halas, Medical Scribe. This patient was seen in Room 2 and the patient's care was started a 8:57 AM.  Chief Complaint  Patient presents with  . Medication Refill    metformin and hydrocodone  . Thrush    HPI  HPI Comments: Edward RoesCharles Bryson [name] is a 38 y.o. male with a hx of thrush, tinea pedis, DM, and chronic pain of the lower right extremity who [facility] and an examination for suspected thrush on his penis that started two weeks ago. Pt states he has not been taking his metformin for the last three weeks.   Pt states he has not been able to get an appointment at the [facility] department for pain management following our referral. Pt states he visited Dr. Perrin MalteseGuest and on 12/16/2013 and was told that he needed to see me for further pain medication, altho I am not his PCP and have agreed to continue his current protocol only until he has new appt.. Dr. Perrin MalteseGuest gave the pt two weeks of pain meds to hold him over.    Pt would like to f/u here at appointment center for regular maintenance of his diabetes.Dr Beverely Lowabori too far away and too hard to get to see. Pt states he [facility], and because of this gets all his medications for free. Pt states he has been feeling well, generally.   Pt states his father passed away two months ago from lung cancer, but states he has been managing well enough. Pt is a smoker, 1 PPD, but states he would like to quit and has not been able to by himself.   Pt states [facility] because his insurance would not cover a drug test with every visit, which [facility] requires.  Pt also states he has "thrush" on his penis--redness and itching--for 2 weeks.   Pt also complains of an ingrown hair on his neck that has continued to  have mild swelling for the last two weeks despite his removal of hairs.  Note last OV Dr Beverely Lowabori 3/14  Patient Active Problem List   Diagnosis Date Noted  . DM (diabetes mellitus), type 2, uncontrolled 11/16/2013  . Chronic pain of right lower extremity---due to growth R calf that has been eval by [place] [facility], but he cannot afford the surgery suggested as possible cure--we have no records of this. 11/16/2013  . Tinea pedis 04/12/2012  . Migraine, unspecified, without mention of intractable migraine without mention of status migrainosus 04/12/2012  . Soft tissue mass--R calf 04/12/2012  . Tobacco use disorder 04/12/2012    Current Outpatient Prescriptions on File Prior to Visit  Medication Sig Dispense Refill  . glucose blood (ACCU-CHEK AVIVA PLUS) test strip Use as instructed 100 each 3  . HYDROcodone-acetaminophen (NORCO/VICODIN) 5-325 MG per tablet Take 1 tablet by mouth every 6 (six) hours as needed for moderate pain or severe pain. 60 tablet 0  . metFORMIN (GLUCOPHAGE) 500 MG tablet Take 500 mg by mouth 2 (two) times daily with a meal. Not taking///was given glumetza due to GI intol of this med but couldn't afford/     Review of Systems  Constitutional: Negative for fever, chills, activity change, fatigue and unexpected weight change.  HENT: Negative for trouble  swallowing.   Eyes: Negative for visual disturbance.  Respiratory: Negative for chest tightness and shortness of breath.        Still smoking and unable to quit.  Cardiovascular: Negative for chest pain and palpitations.  Genitourinary: Negative for dysuria and difficulty urinating.  Neurological: Negative for weakness and numbness.  Psychiatric/Behavioral: Negative for sleep disturbance.  MS-ER for L leg cellulitis 9/15     Objective:  BP 98/82 mmHg  Pulse 82  Temp(Src) 98.4 F (36.9 C) (Oral)  Resp 18  Ht 5\' 8"  (1.727 m)  Wt 195 lb (88.451 kg)  BMI 29.66 kg/m2  SpO2 98%  Physical Exam  Constitutional: He is  oriented to person, place, and time. He appears well-developed and well-nourished.  HENT:  Head: Normocephalic and atraumatic.  Eyes: Conjunctivae and EOM are normal. Pupils are equal, round, and reactive to light.  Cardiovascular: Normal rate, regular rhythm, normal heart sounds and intact distal pulses.   No murmur heard. Pulmonary/Chest: Effort normal and breath sounds normal. No respiratory distress.  Musculoskeletal: He exhibits no edema.  R calf tense and tender w/ Mass as before  Neurological: He is alert and oriented to person, place, and time. He has normal reflexes.  Skin: Skin is warm and dry.  Psychiatric: He has a normal mood and affect. His behavior is normal.  Nursing note and vitals reviewed. balanitis/corona     Assessment & Plan:  DM (diabetes mellitus), type 2, uncontrolled He would [facility] Dr Beverely Lowabori due to ease of access including location Will set up f/u by appt at 104 to establish care in 2 mos Will need statin and ACE--discussed w/ him Eligible for pneumovax Flu shot today First labs here at f/u Missed last scheduled eye exam 2014  Chronic pain of right lower extremity--will ck referral/ref meds  Tobacco use disorder--disc cessation//he's not ready to commit despite father's death  Candidal balanitis--due to uncontr DM  Significant compliance issues-longstanding  Meds ordered this encounter  Medications  . HYDROcodone-acetaminophen (NORCO/VICODIN) 5-325 MG per tablet    Sig: Take 1 tablet by mouth every 6 (six) hours as needed for moderate pain or severe pain.    Dispense:  120 tablet    Refill:  0  . metFORMIN (GLUCOPHAGE) 500 MG tablet    Sig: Take 1 tablet (500 mg total) by mouth 2 (two) times daily with a meal.    Dispense:  180 tablet    Refill:  1  . fluconazole (DIFLUCAN) 150 MG tablet    Sig: Take 1 tablet (150 mg total) by mouth once. Repeat in 1 week if not well    Dispense:  2 tablet    Refill:  0     I have completed the patient encounter in its entirety as documented by the scribe, with editing by me where necessary. Sharina Petre P. Merla Richesoolittle, M.D.

## 2014-01-03 NOTE — Progress Notes (Signed)
LMVM for pt to call back to schedule an appointment with any provider in 2 months to monitor his diabetes.

## 2014-01-28 ENCOUNTER — Ambulatory Visit (INDEPENDENT_AMBULATORY_CARE_PROVIDER_SITE_OTHER): Payer: 59 | Admitting: Emergency Medicine

## 2014-01-28 VITALS — BP 112/70 | HR 78 | Temp 97.8°F | Resp 16 | Ht 68.0 in | Wt 197.0 lb

## 2014-01-28 DIAGNOSIS — E1165 Type 2 diabetes mellitus with hyperglycemia: Secondary | ICD-10-CM

## 2014-01-28 DIAGNOSIS — M79604 Pain in right leg: Secondary | ICD-10-CM

## 2014-01-28 DIAGNOSIS — IMO0002 Reserved for concepts with insufficient information to code with codable children: Secondary | ICD-10-CM

## 2014-01-28 DIAGNOSIS — G8929 Other chronic pain: Secondary | ICD-10-CM

## 2014-01-28 DIAGNOSIS — R2241 Localized swelling, mass and lump, right lower limb: Secondary | ICD-10-CM

## 2014-01-28 MED ORDER — METFORMIN HCL 500 MG PO TABS: 500.0000 mg | ORAL_TABLET | Freq: Two times a day (BID) | ORAL | Status: DC

## 2014-01-28 MED ORDER — HYDROCODONE-ACETAMINOPHEN 5-325 MG PO TABS: 1.0000 | ORAL_TABLET | Freq: Four times a day (QID) | ORAL | Status: DC | PRN

## 2014-01-28 NOTE — Patient Instructions (Signed)

## 2014-01-28 NOTE — Addendum Note (Signed)
Addended by: Carmelina DaneANDERSON, Meer Reindl S on: 01/28/2014 08:31 AM   Modules accepted: Orders

## 2014-01-28 NOTE — Addendum Note (Signed)
Addended by: Carmelina DaneANDERSON, Kayla Deshaies S on: 01/28/2014 08:29 AM   Modules accepted: Orders

## 2014-01-28 NOTE — Progress Notes (Signed)
[facility] [address], [place] [address] [phone]  Date:  01/28/2014   Name:  Edward [name]   DOB:  11/22/1975   MRN:  [id number]  PCP:  Neena RhymesKatherine Tabori, MD    Chief Complaint: Medication Refill; Metformin; and Hydrocodone   History of Present Illness:  Edward [name] is a 38 y.o. very pleasant male patient who presents with the following:  [facility] and left due to unwillingness to pay for drug screen.   Waiting for two months to get in to "[facility]".   [place] tomorrow. Says FBS 140-145 yesterday Denies other complaint or health concern today.   Patient Active Problem List   Diagnosis Date Noted  . DM (diabetes mellitus), type 2, uncontrolled 11/16/2013  . Chronic pain of right lower extremity 11/16/2013  . Type II or unspecified type diabetes mellitus with neurological manifestations, not stated as uncontrolled(250.60) 04/12/2012  . Tinea pedis 04/12/2012  . Migraine, unspecified, without mention of intractable migraine without mention of status migrainosus 04/12/2012  . Soft tissue mass 04/12/2012  . Tobacco use disorder 04/12/2012    Past Medical History  Diagnosis Date  . Chicken pox   . Diabetes mellitus without complication   . Increased frequency of headaches     Past Surgical History  Procedure Laterality Date  . Incise and drain abcess      left hand and jaw    History  Substance Use Topics  . Smoking status: Current Every Day Smoker -- 1.00 packs/day    Types: Cigarettes  . Smokeless tobacco: Never Used  . Alcohol Use: Yes    Family History  Problem Relation Age of Onset  . Heart disease Mother   . Diabetes Mother   . Hyperlipidemia Father   . Stroke Father   . Hypertension Father   . Lung cancer Father   . Cancer Paternal Grandfather     prostate    No Known Allergies  Medication list has been reviewed and updated.  Current Outpatient  Prescriptions on File Prior to Visit  Medication Sig Dispense Refill  . fluconazole (DIFLUCAN) 150 MG tablet Take 1 tablet (150 mg total) by mouth once. Repeat in 1 week if not well 2 tablet 0  . glucose blood (ACCU-CHEK AVIVA PLUS) test strip Use as instructed 100 each 3  . HYDROcodone-acetaminophen (NORCO/VICODIN) 5-325 MG per tablet Take 1 tablet by mouth every 6 (six) hours as needed for moderate pain or severe pain. 120 tablet 0  . metFORMIN (GLUCOPHAGE) 500 MG tablet Take 1 tablet (500 mg total) by mouth 2 (two) times daily with a meal. 180 tablet 1   No current facility-administered medications on file prior to visit.    Review of Systems:  As per HPI, otherwise negative.    Physical Examination: Filed Vitals:   01/28/14 0809  BP: 112/70  Pulse: 78  Temp: 97.8 F (36.6 C)  Resp: 16   Filed Vitals:   01/28/14 0809  Height: 5\' 8"  (1.727 m)  Weight: 197 lb (89.359 kg)   Body mass index is 29.96 kg/(m^2). Ideal Body Weight: Weight in (lb) to have BMI = 25: 164.1   GEN: WDWN, NAD, Non-toxic, Alert & Oriented x 3 HEENT: Atraumatic, Normocephalic.  Ears and Nose: No external deformity. EXTR: No clubbing/cyanosis/edema NEURO: Normal gait.  PSYCH: Normally interactive. Conversant. Not depressed or anxious appearing.  Calm demeanor.    Assessment and Plan: Chronic pain  management pending surgery or referral to pain clinic Refill for two weeks as he is not a "regular" patient of Dr Merla Richesoolittle  Signed,  Phillips OdorJeffery Aimy Sweeting, MD

## 2014-02-25 ENCOUNTER — Ambulatory Visit (INDEPENDENT_AMBULATORY_CARE_PROVIDER_SITE_OTHER): Payer: 59 | Admitting: Emergency Medicine

## 2014-02-25 VITALS — BP 128/82 | HR 77 | Temp 98.0°F | Resp 17 | Ht 68.0 in | Wt 194.4 lb

## 2014-02-25 DIAGNOSIS — E119 Type 2 diabetes mellitus without complications: Secondary | ICD-10-CM

## 2014-02-25 DIAGNOSIS — R2241 Localized swelling, mass and lump, right lower limb: Secondary | ICD-10-CM

## 2014-02-25 DIAGNOSIS — M79604 Pain in right leg: Secondary | ICD-10-CM

## 2014-02-25 LAB — LIPID PANEL
Cholesterol: 190 mg/dL (ref 0–200)
HDL: 22 mg/dL — AB (ref >39)
TRIGLYCERIDES: 708 mg/dL — AB (ref <150)
Total CHOL/HDL Ratio: 8.6 Ratio

## 2014-02-25 LAB — BASIC METABOLIC PANEL
BUN: 16 mg/dL (ref 6–23)
CHLORIDE: 102 meq/L (ref 96–112)
CO2: 22 meq/L (ref 19–32)
Calcium: 9.3 mg/dL (ref 8.4–10.5)
Creat: 0.75 mg/dL (ref 0.50–1.35)
Glucose, Bld: 264 mg/dL — ABNORMAL HIGH (ref 70–99)
Potassium: 4.4 mEq/L (ref 3.5–5.3)
Sodium: 135 mEq/L (ref 135–145)

## 2014-02-25 LAB — POCT GLYCOSYLATED HEMOGLOBIN (HGB A1C): HEMOGLOBIN A1C: 8.7

## 2014-02-25 MED ORDER — HYDROCODONE-ACETAMINOPHEN 5-325 MG PO TABS: 1.0000 | ORAL_TABLET | Freq: Four times a day (QID) | ORAL | Status: DC | PRN

## 2014-02-25 MED ORDER — METFORMIN HCL 1000 MG PO TABS: 1000.0000 mg | ORAL_TABLET | Freq: Two times a day (BID) | ORAL | Status: DC

## 2014-02-25 NOTE — Patient Instructions (Signed)
[facility] Policy for Prescribing Controlled Substances (Revised 12/2011) 1. Prescriptions for controlled substances will be filled by ONE provider at [facility] with whom you have established and developed a plan for your care, including follow-up. 2. You are encouraged to schedule an appointment with your prescriber at our appointment center for follow-up visits whenever possible. 3. If you request a prescription for the controlled substance while at [facility] for an acute problem (with someone other than your regular prescriber), you MAY be given a ONE-TIME prescription for a 30-day supply of the controlled substance, to allow time for you to return to see your regular prescriber for additional prescriptions.     Type 2 Diabetes Mellitus Type 2 diabetes mellitus, often simply referred to as type 2 diabetes, is a long-lasting (chronic) disease. In type 2 diabetes, the pancreas does not make enough insulin (a hormone), the cells are less responsive to the insulin that is made (insulin resistance), or both. Normally, insulin moves sugars from food into the tissue cells. The tissue cells use the sugars for energy. The lack of insulin or the lack of normal response to insulin causes excess sugars to build up in the blood instead of going into the tissue cells. As a result, high blood sugar (hyperglycemia) develops. The effect of high sugar (glucose) levels can cause many complications. Type 2 diabetes was also previously called adult-onset diabetes, but it can occur at any age.  RISK FACTORS  A person is predisposed to developing type 2 diabetes if someone in the family has the disease and also has one or more of the following primary risk factors:  Overweight.  An inactive lifestyle.  A history of consistently eating high-calorie foods. Maintaining a normal weight and regular physical activity can reduce the chance of developing type 2 diabetes. SYMPTOMS  A person with type 2 diabetes may not show symptoms  initially. The symptoms of type 2 diabetes appear slowly. The symptoms include:  Increased thirst (polydipsia).  Increased urination (polyuria).  Increased urination during the night (nocturia).  Weight loss. This weight loss may be rapid.  Frequent, recurring infections.  Tiredness (fatigue).  Weakness.  Vision changes, such as blurred vision.  Fruity smell to your breath.  Abdominal pain.  Nausea or vomiting.  Cuts or bruises which are slow to heal.  Tingling or numbness in the hands or feet. DIAGNOSIS Type 2 diabetes is frequently not diagnosed until complications of diabetes are present. Type 2 diabetes is diagnosed when symptoms or complications are present and when blood glucose levels are increased. Your blood glucose level may be checked by one or more of the following blood tests:  A fasting blood glucose test. You will not be allowed to eat for at least 8 hours before a blood sample is taken.  A random blood glucose test. Your blood glucose is checked at any time of the day regardless of when you ate.  A hemoglobin A1c blood glucose test. A hemoglobin A1c test provides information about blood glucose control over the previous 3 months.  An oral glucose tolerance test (OGTT). Your blood glucose is measured after you have not eaten (fasted) for 2 hours and then after you drink a glucose-containing beverage. TREATMENT   You may need to take insulin or diabetes medicine daily to keep blood glucose levels in the desired range.  If you use insulin, you may need to adjust the dosage depending on the carbohydrates that you eat with each meal or snack. The treatment goal is to maintain  the before meal blood sugar (preprandial glucose) level at 70-130 mg/dL. HOME CARE INSTRUCTIONS   Have your hemoglobin A1c level checked twice a year.  Perform daily blood glucose monitoring as directed by your health care provider.  Monitor urine ketones when you are ill and as  directed by your health care provider.  Take your diabetes medicine or insulin as directed by your health care provider to maintain your blood glucose levels in the desired range.  Never run out of diabetes medicine or insulin. It is needed every day.  If you are using insulin, you may need to adjust the amount of insulin given based on your intake of carbohydrates. Carbohydrates can raise blood glucose levels but need to be included in your diet. Carbohydrates provide vitamins, minerals, and fiber which are an essential part of a healthy diet. Carbohydrates are found in fruits, vegetables, whole grains, dairy products, legumes, and foods containing added sugars.  Eat healthy foods. You should make an appointment to see a registered dietitian to help you create an eating plan that is right for you.  Lose weight if you are overweight.  Carry a medical alert card or wear your medical alert jewelry.  Carry a 15-gram carbohydrate snack with you at all times to treat low blood glucose (hypoglycemia). Some examples of 15-gram carbohydrate snacks include:  Glucose tablets, 3 or 4.  Glucose gel, 15-gram tube.  Raisins, 2 tablespoons (24 grams).  Jelly beans, 6.  Animal crackers, 8.  Regular pop, 4 ounces (120 mL).  Gummy treats, 9.  Recognize hypoglycemia. Hypoglycemia occurs with blood glucose levels of 70 mg/dL and below. The risk for hypoglycemia increases when fasting or skipping meals, during or after intense exercise, and during sleep. Hypoglycemia symptoms can include:  Tremors or shakes.  Decreased ability to concentrate.  Sweating.  Increased heart rate.  Headache.  Dry mouth.  Hunger.  Irritability.  Anxiety.  Restless sleep.  Altered speech or coordination.  Confusion.  Treat hypoglycemia promptly. If you are alert and able to safely swallow, follow the 15:15 rule:  Take 15-20 grams of rapid-acting glucose or carbohydrate. Rapid-acting options include  glucose gel, glucose tablets, or 4 ounces (120 mL) of fruit juice, regular soda, or low-fat milk.  Check your blood glucose level 15 minutes after taking the glucose.  Take 15-20 grams more of glucose if the repeat blood glucose level is still 70 mg/dL or below.  Eat a meal or snack within 1 hour once blood glucose levels return to normal.  Be alert to feeling very thirsty and urinating more frequently than usual, which are early signs of hyperglycemia. An early awareness of hyperglycemia allows for prompt treatment. Treat hyperglycemia as directed by your health care provider.  Engage in at least 150 minutes of moderate-intensity physical activity a week, spread over at least 3 days of the week or as directed by your health care provider. In addition, you should engage in resistance exercise at least 2 times a week or as directed by your health care provider. Try to spend no more than 90 minutes at one time inactive.  Adjust your medicine and food intake as needed if you start a new exercise or sport.  Follow your sick-day plan anytime you are unable to eat or drink as usual.  Do not use any tobacco products including cigarettes, chewing tobacco, or electronic cigarettes. If you need help quitting, ask your health care provider.  Limit alcohol intake to no more than 1 drink per day  for nonpregnant women and 2 drinks per day for men. You should drink alcohol only when you are also eating food. Talk with your health care provider whether alcohol is safe for you. Tell your health care provider if you drink alcohol several times a week.  Keep all follow-up visits as directed by your health care provider. This is important.  Schedule an eye exam soon after the diagnosis of type 2 diabetes and then annually.  Perform daily skin and foot care. Examine your skin and feet daily for cuts, bruises, redness, nail problems, bleeding, blisters, or sores. A foot exam by a health care provider should be  done annually.  Brush your teeth and gums at least twice a day and floss at least once a day. Follow up with your dentist regularly.  Share your diabetes management plan with your workplace or school.  Stay up-to-date with immunizations. It is recommended that people with diabetes who are over 67 years old get the pneumonia vaccine. In some cases, two separate shots may be given. Ask your health care provider if your pneumonia vaccination is up-to-date.  Learn to manage stress.  Obtain ongoing diabetes education and support as needed.  Participate in or seek rehabilitation as needed to maintain or improve independence and quality of life. Request a physical or occupational therapy referral if you are having foot or hand numbness, or difficulties with grooming, dressing, eating, or physical activity. SEEK MEDICAL CARE IF:   You are unable to eat food or drink fluids for more than 6 hours.  You have nausea and vomiting for more than 6 hours.  Your blood glucose level is over 240 mg/dL.  There is a change in mental status.  You develop an additional serious illness.  You have diarrhea for more than 6 hours.  You have been sick or have had a fever for a couple of days and are not getting better.  You have pain during any physical activity.  SEEK IMMEDIATE MEDICAL CARE IF:  You have difficulty breathing.  You have moderate to large ketone levels. MAKE SURE YOU:  Understand these instructions.  Will watch your condition.  Will get help right away if you are not doing well or get worse. Document Released: 02/15/2005 Document Revised: 07/02/2013 Document Reviewed: 09/14/2011 [facility] Patient Information 2015 [place], [place]. This information is not intended to replace advice given to you by your health care provider. Make sure you discuss any questions you have with your health care provider.

## 2014-02-25 NOTE — Progress Notes (Signed)
MRN: [id number] DOB: 06/26/1975  Subjective:   Edward RoesCharles Bryson [name] is a 38 y.o. male presenting for medication refills.  Smoking 1ppd, rare alcohol use.  Mass in right leg - patient experiences pain daily due to a mass in his right calf. Patient has been [facility] at [facility] same complaint ~1 year ago. Was referred to [facility] and recommended surgical excision. He was unable to afford the procedure ~1 year ago, so d/t pain, was sent to Dr. Jordan LikesSpivey, [facility]. Unfortunately, patient's insurance coverage changed and he started getting charged more. He started seeing Dr. Merla Richesoolittle 11/2013 for the pain in his leg. [facility] but has not been able to get in. Patient is scheduled to get his visit with [facility] in January. States that he should be able to afford it this time around since he placed more money in his [employer] [place] and his wife won't have to use it as much for her health expenses. Today, patient reports mass is largely unchanged, pain is felt with extension of leg, with walking, climbing stairs, does construction for work. Patient states that he uses 3-4 Hydrocodone per day depending on his level of activity. Denies any symptoms of constipation, urinary retention, no drowsiness. Denies any other aggravating or relieving factors, no other questions or concerns.  Diabetes - diagnosed 2012, checking blood sugar, ranges from 140-150, managed with Metformin. Is trying to eat better, has harder time with starches (potatoes), vegetables. Does not exercise, states that he is active enough at work. Admits occasional blurred vision, polydipsia and polyuria. Denies any foot issues (checks feet regularly), hypoglycemia. Has never had eye exam.   Edward [name] has a current medication list which includes the following prescription(s): glucose blood, hydrocodone-acetaminophen, metformin, and fluconazole.  He has No Known  Allergies.  Edward [name]  has a past medical history of Chicken pox; Diabetes mellitus without complication; and Increased frequency of headaches. Also  has past surgical history that includes Incise and drain abcess.  ROS As in subjective.  Objective:   Vitals: BP 128/82 mmHg  Pulse 77  Temp(Src) 98 F (36.7 C) (Oral)  Resp 17  Ht 5\' 8"  (1.727 m)  Wt 154 lb (69.854 kg)  BMI 23.42 kg/m2  SpO2 99%  Wt Readings from Last 3 Encounters:  02/25/14 154 lb (69.854 kg)  01/28/14 197 lb (89.359 kg)  01/01/14 195 lb (88.451 kg)   Physical Exam  Constitutional: He is oriented to person, place, and time and well-developed, well-nourished, and in no distress.  Eyes: Conjunctivae and EOM are normal. Right eye exhibits no discharge. Left eye exhibits no discharge. No scleral icterus.  Neck: Normal range of motion. Neck supple.  Cardiovascular: Normal rate, regular rhythm, normal heart sounds and intact distal pulses.  Exam reveals no gallop and no friction rub.   No murmur heard. Pulmonary/Chest: Effort normal and breath sounds normal. No stridor. No respiratory distress. He has no wheezes. He has no rales. He exhibits no tenderness.  Abdominal: Soft. Bowel sounds are normal. He exhibits no distension and no mass. There is no tenderness.  Musculoskeletal:  Mass ~2cm in posterior right leg at mid-calf. No erythema, tenderness, edema. Negative Homans. Full ROM, strength 5/5, sensation intact.  Lymphadenopathy:    He has no cervical adenopathy.  Neurological: He is alert and oriented to person, place, and time.  Skin: Skin is warm and dry. No rash noted. He is not diaphoretic. No  erythema.  Psychiatric: Mood and affect normal.   Results for orders placed or performed in visit on 02/25/14 (from the past 24 hour(s))  POCT glycosylated hemoglobin (Hb A1C)     Status: None   Collection Time: 02/25/14 11:18 AM  Result Value Ref Range   Hemoglobin A1C 8.7    Assessment and Plan :   1. Diabetes  mellitus type 2, uncomplicated - uncontrolled, increase Metformin from 1,000 to 2,000mg . Advised dietary modifications, start exercising regularly - bmet, lipid panel pending - consider adding ACEI, statin  2. Mass of leg, right 3. Pain of right lower extremity - Counseled patient on need for surgery, this should not be managed with pain medication. - Provided patient with our substance use policy and recommended that he follow through with pain clinic if he does not want to get his surgery - HYDROcodone-acetaminophen (NORCO/VICODIN) 5-325 MG per tablet; Take 1 tablet by mouth every 6 (six) hours as needed for moderate pain.  Dispense: 30 tablet; Refill: 0 - Ambulatory referral to Orthopedic Surgery  Wallis BambergMario Tristram Milian, PA-C [facility] [phone] 02/25/2014 11:25 AM

## 2014-02-26 ENCOUNTER — Telehealth: Payer: Self-pay

## 2014-02-26 ENCOUNTER — Telehealth: Payer: Self-pay | Admitting: Urgent Care

## 2014-02-26 ENCOUNTER — Other Ambulatory Visit: Payer: Self-pay | Admitting: Radiology

## 2014-02-26 ENCOUNTER — Encounter: Payer: Self-pay | Admitting: Urgent Care

## 2014-02-26 DIAGNOSIS — IMO0002 Reserved for concepts with insufficient information to code with codable children: Secondary | ICD-10-CM

## 2014-02-26 DIAGNOSIS — Z7189 Other specified counseling: Secondary | ICD-10-CM

## 2014-02-26 DIAGNOSIS — E1165 Type 2 diabetes mellitus with hyperglycemia: Secondary | ICD-10-CM

## 2014-02-26 DIAGNOSIS — M79604 Pain in right leg: Secondary | ICD-10-CM

## 2014-02-26 DIAGNOSIS — M7989 Other specified soft tissue disorders: Secondary | ICD-10-CM

## 2014-02-26 DIAGNOSIS — E781 Pure hyperglyceridemia: Secondary | ICD-10-CM

## 2014-02-26 DIAGNOSIS — G8929 Other chronic pain: Secondary | ICD-10-CM

## 2014-02-26 MED ORDER — GEMFIBROZIL 600 MG PO TABS: 600.0000 mg | ORAL_TABLET | Freq: Two times a day (BID) | ORAL | Status: DC

## 2014-02-26 MED ORDER — GLUCOSE BLOOD VI STRP: ORAL_STRIP | Status: DC

## 2014-02-26 NOTE — Telephone Encounter (Signed)
Spoke with patient's wife, she provided home [phone]. Unable to reach patient. Please let patient know that I would like to discuss medication to lower cholesterol/triglycerides. If patient can provide a reliable time to reach him, I will gladly call him to discuss treatment options.  Wallis BambergMario Sota Hetz, PA-C [facility] [phone] 02/26/2014  1:23 PM

## 2014-02-26 NOTE — Telephone Encounter (Signed)
Pt wife has been advised. Need referral information.

## 2014-02-26 NOTE — Telephone Encounter (Signed)
Patient returning [facility] to [facility]. Patient will be at [phone] until 2:45pm and then he will be back home after 3:15pm today. I informed patient of the call being in regards to lowering his cholesterol/triglycerides.please call patient back at [phone].

## 2014-02-26 NOTE — Telephone Encounter (Signed)
Pt states we called in all his medicines except the ACCU-CHEK AVIVA PLUS 100 and he is in need of also Please call pt at [phone]     [facility]

## 2014-02-26 NOTE — Telephone Encounter (Signed)
Spoke with patient, agreed to start Gemfibrozil, sent a rx to the pharmacy, discussed benefits and potential side effects, will monitor. Reiterated dietary modifications, exercise, advised that he could start otc fish oil supplement. Follow up in 3 months.  Wallis BambergMario Hillary Schwegler, PA-C [facility] [phone] 02/26/2014  4:27 PM

## 2014-03-05 ENCOUNTER — Ambulatory Visit (INDEPENDENT_AMBULATORY_CARE_PROVIDER_SITE_OTHER): Payer: 59 | Admitting: Internal Medicine

## 2014-03-05 VITALS — BP 110/78 | HR 58 | Temp 98.3°F | Resp 16 | Ht 68.7 in | Wt 193.0 lb

## 2014-03-05 DIAGNOSIS — R2241 Localized swelling, mass and lump, right lower limb: Secondary | ICD-10-CM

## 2014-03-05 DIAGNOSIS — G8929 Other chronic pain: Secondary | ICD-10-CM

## 2014-03-05 DIAGNOSIS — M799 Soft tissue disorder, unspecified: Secondary | ICD-10-CM

## 2014-03-05 DIAGNOSIS — M7989 Other specified soft tissue disorders: Secondary | ICD-10-CM

## 2014-03-05 DIAGNOSIS — M79604 Pain in right leg: Secondary | ICD-10-CM

## 2014-03-05 MED ORDER — HYDROCODONE-ACETAMINOPHEN 5-325 MG PO TABS: 1.0000 | ORAL_TABLET | Freq: Four times a day (QID) | ORAL | Status: DC | PRN

## 2014-03-05 NOTE — Progress Notes (Signed)
   Subjective:    Patient ID: Edward [name], male    DOB: 02/29/1976, 39 y.o.   MRN: [id number] This chart was scribed for Ellamae Siaobert Breonna Gafford, MD by Littie Deedsichard Sun, Medical Scribe. This patient was seen in Room 12 and the patient's care was started at 5:51 PM.   HPI HPI Comments: Edward [name] is a 39 y.o. male with a hx of DM who presents to the [facility] for a medication refill. Note last visit  He has referral establish now to surgery with an appointment in the next 2 weeks and prefers this route to continued pain medication, so he has canceled his pain med appt as scheduled with [facility].  Patient states his diet has not changed much recently. He has some concern about increased triglyceride levels. He plans to stop going to the pain clinic and stop taking pain medications.    Review of Systems Noncontributory    Objective:   Physical Exam  Constitutional: He is oriented to person, place, and time. He appears well-developed and well-nourished. No distress.  HENT:  Head: Normocephalic and atraumatic.  Eyes: Pupils are equal, round, and reactive to light.  Neck: Normal range of motion.  Cardiovascular: Normal rate and regular rhythm.   Pulmonary/Chest: Effort normal. No respiratory distress.  Musculoskeletal: Normal range of motion.  Neurological: He is alert and oriented to person, place, and time.  Skin: Skin is warm and dry.  Psychiatric: He has a normal mood and affect. His behavior is normal.  Nursing note and vitals reviewed.         Assessment & Plan:  Mass of leg, right - Plan: HYDROcodone-acetaminophen (NORCO/VICODIN) 5-325 MG per tablet  Pain of right lower extremity - Plan: HYDROcodone-acetaminophen (NORCO/VICODIN) 5-325 MG per tablet  Soft tissue mass  Chronic pain of right lower extremity  Meds ordered this encounter  Medications  . HYDROcodone-acetaminophen (NORCO/VICODIN) 5-325 MG per tablet    Sig: Take 1  tablet by mouth every 6 (six) hours as needed for moderate pain.    Dispense:  56 tablet    Refill:  0   This prescription should last until his surgical appointment  I have completed the patient encounter in its entirety as documented by the scribe, with editing by me where necessary. Khadija Thier P. Merla Richesoolittle, M.D.

## 2014-03-07 ENCOUNTER — Ambulatory Visit: Payer: Self-pay | Admitting: Family Medicine

## 2014-05-16 ENCOUNTER — Emergency Department (HOSPITAL_COMMUNITY)
Admission: EM | Admit: 2014-05-16 | Discharge: 2014-05-16 | Disposition: A | Discharge status: Home / Self Care | Payer: 59 | Attending: Emergency Medicine | Admitting: Emergency Medicine

## 2014-05-16 ENCOUNTER — Encounter (HOSPITAL_COMMUNITY): Payer: Self-pay | Admitting: Emergency Medicine

## 2014-05-16 DIAGNOSIS — J029 Acute pharyngitis, unspecified: Secondary | ICD-10-CM | POA: Diagnosis present

## 2014-05-16 DIAGNOSIS — Z720 Tobacco use: Secondary | ICD-10-CM | POA: Insufficient documentation

## 2014-05-16 DIAGNOSIS — J020 Streptococcal pharyngitis: Secondary | ICD-10-CM | POA: Insufficient documentation

## 2014-05-16 DIAGNOSIS — Z8619 Personal history of other infectious and parasitic diseases: Secondary | ICD-10-CM | POA: Diagnosis not present

## 2014-05-16 DIAGNOSIS — Z79899 Other long term (current) drug therapy: Secondary | ICD-10-CM | POA: Insufficient documentation

## 2014-05-16 DIAGNOSIS — E119 Type 2 diabetes mellitus without complications: Secondary | ICD-10-CM | POA: Insufficient documentation

## 2014-05-16 LAB — RAPID STREP SCREEN (MED CTR MEBANE ONLY): STREPTOCOCCUS, GROUP A SCREEN (DIRECT): POSITIVE — AB

## 2014-05-16 MED ORDER — PENICILLIN G BENZATHINE & PROC 1200000 UNIT/2ML IM SUSP
1.2000 10*6.[IU] | Freq: Once | INTRAMUSCULAR | Status: AC
  Administered 2014-05-16: 1.2 10*6.[IU] via INTRAMUSCULAR
  Filled 2014-05-16: qty 2

## 2014-05-16 NOTE — ED Notes (Signed)
Pt asked to remain in ED for 15 minutes to ensure he does not have a reaction to IM injection.

## 2014-05-16 NOTE — ED Notes (Signed)
Pt states that a couple days ago he started having pain to the right side of his throat  Pt states now both sides of his throat hurt and the pain radiates up into his neck  Pt states it is tender to palpation and he had a low grade fever last night  Pt states the pain is worse when he swallows

## 2014-05-16 NOTE — Discharge Instructions (Signed)

## 2014-05-16 NOTE — ED Provider Notes (Signed)
CSN: [id number]     Arrival date & time 05/16/14  0636 History   First MD Initiated Contact with Patient 05/16/14 0725     Chief Complaint  Patient presents with  . Sore Throat     (Consider location/radiation/quality/duration/timing/severity/associated sxs/prior Treatment) Patient is a 39 y.o. male presenting with pharyngitis.  Sore Throat This is a new problem. The current episode started 2 days ago. The problem occurs constantly. The problem has been gradually worsening. Pertinent negatives include no chest pain, no abdominal pain and no shortness of breath. Associated symptoms comments: No cough.  Fever of 101. Lymphadenopathy. No difficulty swallowing. No cough.. Nothing aggravates the symptoms. Nothing relieves the symptoms. Treatments tried: Sore throat lozenges, tylenol. The treatment provided mild relief.    Past Medical History  Diagnosis Date  . Chicken pox   . Diabetes mellitus without complication   . Increased frequency of headaches    Past Surgical History  Procedure Laterality Date  . Incise and drain abcess      left hand and jaw   Family History  Problem Relation Age of Onset  . Heart disease Mother   . Diabetes Mother   . Hyperlipidemia Father   . Stroke Father   . Hypertension Father   . Lung cancer Father   . Cancer Paternal Grandfather     prostate   History  Substance Use Topics  . Smoking status: Current Every Day Smoker -- 1.00 packs/day for 29 years    Types: Cigarettes  . Smokeless tobacco: Never Used  . Alcohol Use: No    Review of Systems  Respiratory: Negative for shortness of breath.   Cardiovascular: Negative for chest pain.  Gastrointestinal: Negative for abdominal pain.  All other systems reviewed and are negative.     Allergies  Review of patient's allergies indicates no known allergies.  Home Medications   Prior to Admission medications   Medication Sig Start Date End Date Taking? Authorizing Provider  gemfibrozil  (LOPID) 600 MG tablet Take 1 tablet (600 mg total) by mouth 2 (two) times daily before a meal. 02/26/14   Wallis Bamberg, PA-C  glucose blood (ACCU-CHEK AVIVA PLUS) test strip Use as instructed 02/26/14   Morrell Riddle, PA-C  HYDROcodone-acetaminophen (NORCO/VICODIN) 5-325 MG per tablet Take 1 tablet by mouth every 6 (six) hours as needed for moderate pain. 03/05/14   Tonye Pearson, MD  metFORMIN (GLUCOPHAGE) 1000 MG tablet Take 1 tablet (1,000 mg total) by mouth 2 (two) times daily with a meal. 02/25/14   Wallis Bamberg, PA-C   BP 123/85 mmHg  Pulse 95  Temp(Src) 98.2 F (36.8 C) (Oral)  Resp 20  SpO2 98% Physical Exam  Constitutional: He is oriented to person, place, and time. He appears well-developed and well-nourished. No distress.  HENT:  Head: Normocephalic and atraumatic.  Mouth/Throat: Posterior oropharyngeal erythema present. No oropharyngeal exudate, posterior oropharyngeal edema or tonsillar abscesses.  Eyes: Conjunctivae are normal. No scleral icterus.  Neck: Neck supple.  Cardiovascular: Normal rate and intact distal pulses.   Pulmonary/Chest: Effort normal and breath sounds normal. No stridor. No respiratory distress. He has no wheezes. He has no rales.  Abdominal: Normal appearance. He exhibits no distension.  Neurological: He is alert and oriented to person, place, and time.  Skin: Skin is warm and dry. No rash noted.  Psychiatric: He has a normal mood and affect. His behavior is normal.  Nursing note and vitals reviewed.   ED Course  Procedures (including critical care  time) Labs Review Labs Reviewed  RAPID STREP SCREEN - Abnormal; Notable for the following:    Streptococcus, Group A Screen (Direct) POSITIVE (*)    All other components within normal limits    Imaging Review No results found.   EKG Interpretation None      MDM   Final diagnoses:  Strep throat    Strep throat treated with Bicillin.     Blake DivineJohn Quintina Hakeem, MD 05/16/14 [phone]

## 2014-08-08 ENCOUNTER — Telehealth: Payer: Self-pay

## 2014-09-06 ENCOUNTER — Encounter (HOSPITAL_BASED_OUTPATIENT_CLINIC_OR_DEPARTMENT_OTHER): Payer: Self-pay

## 2014-09-06 ENCOUNTER — Telehealth: Payer: Self-pay | Admitting: Family Medicine

## 2014-09-06 ENCOUNTER — Emergency Department (HOSPITAL_BASED_OUTPATIENT_CLINIC_OR_DEPARTMENT_OTHER): Payer: 59

## 2014-09-06 ENCOUNTER — Emergency Department (HOSPITAL_BASED_OUTPATIENT_CLINIC_OR_DEPARTMENT_OTHER)
Admission: EM | Admit: 2014-09-06 | Discharge: 2014-09-06 | Disposition: A | Discharge status: Home / Self Care | Payer: 59 | Attending: Emergency Medicine | Admitting: Emergency Medicine

## 2014-09-06 DIAGNOSIS — Z720 Tobacco use: Secondary | ICD-10-CM | POA: Diagnosis not present

## 2014-09-06 DIAGNOSIS — R6884 Jaw pain: Secondary | ICD-10-CM | POA: Insufficient documentation

## 2014-09-06 DIAGNOSIS — Z79899 Other long term (current) drug therapy: Secondary | ICD-10-CM | POA: Insufficient documentation

## 2014-09-06 DIAGNOSIS — R509 Fever, unspecified: Secondary | ICD-10-CM | POA: Insufficient documentation

## 2014-09-06 DIAGNOSIS — E119 Type 2 diabetes mellitus without complications: Secondary | ICD-10-CM | POA: Insufficient documentation

## 2014-09-06 DIAGNOSIS — Z8619 Personal history of other infectious and parasitic diseases: Secondary | ICD-10-CM | POA: Insufficient documentation

## 2014-09-06 LAB — BASIC METABOLIC PANEL
Anion gap: 6 (ref 5–15)
BUN: 7 mg/dL (ref 6–20)
CALCIUM: 8.5 mg/dL — AB (ref 8.9–10.3)
CHLORIDE: 103 mmol/L (ref 101–111)
CO2: 27 mmol/L (ref 22–32)
CREATININE: 0.76 mg/dL (ref 0.61–1.24)
GFR calc Af Amer: >60 mL/min (ref >60)
GFR calc non Af Amer: >60 mL/min (ref >60)
GLUCOSE: 227 mg/dL — AB (ref 65–99)
Potassium: 3.9 mmol/L (ref 3.5–5.1)
Sodium: 136 mmol/L (ref 135–145)

## 2014-09-06 MED ORDER — HYDROCODONE-ACETAMINOPHEN 5-325 MG PO TABS
2.0000 | ORAL_TABLET | Freq: Once | ORAL | Status: AC
  Administered 2014-09-06: 2 via ORAL
  Filled 2014-09-06: qty 2

## 2014-09-06 MED ORDER — IOHEXOL 300 MG/ML  SOLN
75.0000 mL | Freq: Once | INTRAMUSCULAR | Status: AC | PRN
  Administered 2014-09-06: 75 mL via INTRAVENOUS

## 2014-09-06 MED ORDER — HYDROCODONE-ACETAMINOPHEN 5-325 MG PO TABS: 1.0000 | ORAL_TABLET | Freq: Four times a day (QID) | ORAL | Status: DC | PRN

## 2014-09-06 NOTE — ED Notes (Addendum)
C/o right TMJ pain, low grade fever-pain is worse when chewing-PCP advised pt to come to ED

## 2014-09-06 NOTE — Telephone Encounter (Signed)
Patient presented to [facility] ED.   

## 2014-09-06 NOTE — ED Provider Notes (Signed)
CSN: [id number]     Arrival date & time 09/06/14  1624 History   First MD Initiated Contact with Patient 09/06/14 1631     Chief Complaint  Patient presents with  . Jaw Pain     (Consider location/radiation/quality/duration/timing/severity/associated sxs/prior Treatment) Patient is a 39 y.o. male presenting with general illness. The history is provided by the patient.  Illness Location:  R Jaw pain Quality:  Aching Severity:  Moderate Duration:  2 weeks Timing:  Constant Progression:  Worsening Chronicity:  New Context:  No injury, happened spontaneously Relieved by:  Nothing Worsened by:  Nothing Associated symptoms: fever (low-grade at home)   Associated symptoms: no abdominal pain, no chest pain, no cough, no nausea, no shortness of breath and no vomiting     Past Medical History  Diagnosis Date  . Chicken pox   . Diabetes mellitus without complication   . Increased frequency of headaches    Past Surgical History  Procedure Laterality Date  . Incise and drain abcess      left hand and jaw   Family History  Problem Relation Age of Onset  . Heart disease Mother   . Diabetes Mother   . Hyperlipidemia Father   . Stroke Father   . Hypertension Father   . Lung cancer Father   . Cancer Paternal Grandfather     prostate   History  Substance Use Topics  . Smoking status: Current Every Day Smoker -- 1.00 packs/day for 29 years    Types: Cigarettes  . Smokeless tobacco: Never Used  . Alcohol Use: No    Review of Systems  Constitutional: Positive for fever (low-grade at home). Negative for chills.  Respiratory: Negative for cough and shortness of breath.   Cardiovascular: Negative for chest pain.  Gastrointestinal: Negative for nausea, vomiting and abdominal pain.  All other systems reviewed and are negative.     Allergies  Review of patient's allergies indicates no known allergies.  Home Medications   Prior to Admission medications   Medication Sig Start  Date End Date Taking? Authorizing Provider  gemfibrozil (LOPID) 600 MG tablet Take 1 tablet (600 mg total) by mouth 2 (two) times daily before a meal. 02/26/14   Wallis BambergMario Mani, PA-C  glucose blood (ACCU-CHEK AVIVA PLUS) test strip Use as instructed 02/26/14   Morrell RiddleSarah L Weber, PA-C  metFORMIN (GLUCOPHAGE) 1000 MG tablet Take 1 tablet (1,000 mg total) by mouth 2 (two) times daily with a meal. 02/25/14   Wallis BambergMario Mani, PA-C   BP 117/77 mmHg  Pulse 81  Temp(Src) 98 F (36.7 C) (Oral)  Resp 16  Wt 185 lb (83.915 kg)  SpO2 100% Physical Exam  Constitutional: He is oriented to person, place, and time. He appears well-developed and well-nourished. No distress.  HENT:  Head: Normocephalic and atraumatic.    Mouth/Throat: No oropharyngeal exudate.  No intraoral abscess noted.  Eyes: EOM are normal. Pupils are equal, round, and reactive to light.  Neck: Normal range of motion. Neck supple.  Cardiovascular: Normal rate and regular rhythm.  Exam reveals no friction rub.   No murmur heard. Pulmonary/Chest: Effort normal and breath sounds normal. No respiratory distress. He has no wheezes. He has no rales.  Abdominal: He exhibits no distension. There is no tenderness. There is no rebound.  Musculoskeletal: Normal range of motion. He exhibits no edema.  Neurological: He is alert and oriented to person, place, and time.  Skin: He is not diaphoretic.    ED Course  Procedures (  including critical care time) Labs Review Labs Reviewed - No data to display  Imaging Review Ct Maxillofacial W/cm  09/06/2014   CLINICAL DATA:  Acute onset of right temporomandibular joint pain. Initial encounter.  EXAM: CT MAXILLOFACIAL WITH CONTRAST  TECHNIQUE: Multidetector CT imaging of the maxillofacial structures was performed with intravenous contrast. Multiplanar CT image reconstructions were also generated. A small metallic BB was placed on the right temple in order to reliably differentiate right from left.  CONTRAST:   75mL OMNIPAQUE IOHEXOL 300 MG/ML  SOLN  COMPARISON:  None.  FINDINGS: There is no evidence of fracture or dislocation. The maxilla and mandible appear intact. The nasal bone is unremarkable in appearance. The visualized dentition demonstrates no acute abnormality. 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  The temporomandibular joints are symmetric and unremarkable in appearance on CT. No abnormal contrast enhancement is appreciated.  The orbits are intact bilaterally. The visualized paranasal sinuses and mastoid air cells are well-aerated.  No significant soft tissue abnormalities are seen. The parapharyngeal fat planes are preserved. The nasopharynx, oropharynx and hypopharynx are unremarkable in appearance. The visualized portions of the valleculae and piriform sinuses are grossly unremarkable.  The parotid and submandibular glands are within normal limits. No cervical lymphadenopathy is seen. The visualized portions of the brain are grossly unremarkable.  IMPRESSION: Unremarkable maxillofacial CT. The temporomandibular joints are symmetric and unremarkable in appearance. If the patient's symptoms persist, MRI of the temporomandibular joints could be considered to assess for underlying disc abnormality.   Electronically Signed   By: Roanna Raider M.D.   On: 09/06/2014 18:42     EKG Interpretation None      MDM   Final diagnoses:  Jaw pain    21M here with R jaw pain for a few weeks. Subjective fever over past few days. Pain worse with chewing, radiates into mouth, R temple region. Here no abscess noted inside the mouth. No jaw dislocation noted. I think he likely has TMJ pain, but with hx of deep neck infections and low grade fevers at home, will scan. CT negative. Given ENT referral and some pain meds for TMJ.   Elwin Mocha, MD 09/06/14 2312

## 2014-09-06 NOTE — ED Notes (Signed)
Patient transported to CT 

## 2014-09-06 NOTE — ED Notes (Signed)
MD at bedside discussing dispo plan of care. 

## 2014-09-06 NOTE — Telephone Encounter (Signed)
[facility] - Client TELEPHONE ADVICE RECORD [facility]  Patient Name: Edward [name]  DOB: 06/24/1975    Initial Comment Caller States husband is have facial swelling, fever 100, jaw pain. has been giving tylenol    Nurse Assessment  Nurse: Phylliss Bobowe, RN, Synetta FailAnita Date/Time [name](Eastern Time): 09/06/2014 3:28:44 PM  Confirm and document reason for call. If symptomatic, describe symptoms. ---Caller States husband is have facial swelling, fever 100, jaw pain. has been giving Tylenol caller stated that he has no tooth pain and has been having pain in the joint of his jaw and has had a surgical procedure and has been having the pain for the and has been going on for the past 2 days no resp distress and has swelling on the face  Has the patient traveled out of the country within the last 30 days? ---No  Does the patient require triage? ---Yes  Related visit to physician within the last 2 weeks? ---No  Does the PT have any chronic conditions? (i.e. diabetes, asthma, etc.) ---Yes  List chronic conditions. ---diabetes type II     Guidelines    Guideline Title Affirmed Question Affirmed Notes  Face Swelling Fever    Final Disposition User   Go to ED Now (or PCP triage) Phylliss Bobowe, RN, Synetta FailAnita

## 2014-09-06 NOTE — Telephone Encounter (Signed)
Patient wife called in stating patient is having a fever of 100, jaw pain, and facial swelling. Call transferred to [facility].

## 2014-09-14 ENCOUNTER — Encounter (HOSPITAL_COMMUNITY): Payer: Self-pay | Admitting: Emergency Medicine

## 2014-09-14 ENCOUNTER — Emergency Department (HOSPITAL_COMMUNITY): Payer: 59

## 2014-09-14 ENCOUNTER — Emergency Department (HOSPITAL_COMMUNITY)
Admission: EM | Admit: 2014-09-14 | Discharge: 2014-09-14 | Disposition: A | Discharge status: Home / Self Care | Payer: 59 | Attending: Emergency Medicine | Admitting: Emergency Medicine

## 2014-09-14 DIAGNOSIS — N390 Urinary tract infection, site not specified: Secondary | ICD-10-CM | POA: Insufficient documentation

## 2014-09-14 DIAGNOSIS — R109 Unspecified abdominal pain: Secondary | ICD-10-CM

## 2014-09-14 DIAGNOSIS — Z79899 Other long term (current) drug therapy: Secondary | ICD-10-CM | POA: Insufficient documentation

## 2014-09-14 DIAGNOSIS — R300 Dysuria: Secondary | ICD-10-CM

## 2014-09-14 DIAGNOSIS — Z720 Tobacco use: Secondary | ICD-10-CM | POA: Insufficient documentation

## 2014-09-14 DIAGNOSIS — Z8619 Personal history of other infectious and parasitic diseases: Secondary | ICD-10-CM | POA: Insufficient documentation

## 2014-09-14 DIAGNOSIS — E119 Type 2 diabetes mellitus without complications: Secondary | ICD-10-CM | POA: Diagnosis not present

## 2014-09-14 LAB — CBC WITH DIFFERENTIAL/PLATELET
Basophils Absolute: 0 10*3/uL (ref 0.0–0.1)
Basophils Relative: 0 % (ref 0–1)
Eosinophils Absolute: 0.1 10*3/uL (ref 0.0–0.7)
Eosinophils Relative: 1 % (ref 0–5)
HEMATOCRIT: 41 % (ref 39.0–52.0)
Hemoglobin: 14.1 g/dL (ref 13.0–17.0)
LYMPHS PCT: 15 % (ref 12–46)
Lymphs Abs: 1.8 10*3/uL (ref 0.7–4.0)
MCH: 32.5 pg (ref 26.0–34.0)
MCHC: 34.4 g/dL (ref 30.0–36.0)
MCV: 94.5 fL (ref 78.0–100.0)
MONO ABS: 0.7 10*3/uL (ref 0.1–1.0)
Monocytes Relative: 5 % (ref 3–12)
NEUTROS ABS: 9.5 10*3/uL — AB (ref 1.7–7.7)
Neutrophils Relative %: 79 % — ABNORMAL HIGH (ref 43–77)
Platelets: 211 10*3/uL (ref 150–400)
RBC: 4.34 MIL/uL (ref 4.22–5.81)
RDW: 12.5 % (ref 11.5–15.5)
WBC: 12 10*3/uL — AB (ref 4.0–10.5)

## 2014-09-14 LAB — URINE MICROSCOPIC-ADD ON

## 2014-09-14 LAB — BASIC METABOLIC PANEL
Anion gap: 7 (ref 5–15)
BUN: 9 mg/dL (ref 6–20)
CALCIUM: 8.6 mg/dL — AB (ref 8.9–10.3)
CO2: 23 mmol/L (ref 22–32)
Chloride: 106 mmol/L (ref 101–111)
Creatinine, Ser: 0.87 mg/dL (ref 0.61–1.24)
GFR calc Af Amer: >60 mL/min (ref >60)
GLUCOSE: 248 mg/dL — AB (ref 65–99)
Potassium: 3.9 mmol/L (ref 3.5–5.1)
SODIUM: 136 mmol/L (ref 135–145)

## 2014-09-14 LAB — URINALYSIS, ROUTINE W REFLEX MICROSCOPIC
Bilirubin Urine: NEGATIVE
Ketones, ur: NEGATIVE mg/dL
Nitrite: NEGATIVE
PH: 5.5 (ref 5.0–8.0)
PROTEIN: NEGATIVE mg/dL
Specific Gravity, Urine: 1.007 (ref 1.005–1.030)
Urobilinogen, UA: 0.2 mg/dL (ref 0.0–1.0)

## 2014-09-14 LAB — CK: Total CK: 130 U/L (ref 49–397)

## 2014-09-14 MED ORDER — HYDROCODONE-ACETAMINOPHEN 5-325 MG PO TABS: 2.0000 | ORAL_TABLET | ORAL | Status: DC | PRN

## 2014-09-14 MED ORDER — HYDROMORPHONE HCL 1 MG/ML IJ SOLN
1.0000 mg | Freq: Once | INTRAMUSCULAR | Status: AC
  Administered 2014-09-14: 1 mg via INTRAVENOUS
  Filled 2014-09-14: qty 1

## 2014-09-14 MED ORDER — KETOROLAC TROMETHAMINE 10 MG PO TABS: 10.0000 mg | ORAL_TABLET | Freq: Four times a day (QID) | ORAL | Status: DC | PRN

## 2014-09-14 MED ORDER — CEPHALEXIN 500 MG PO CAPS: 500.0000 mg | ORAL_CAPSULE | Freq: Four times a day (QID) | ORAL | Status: DC

## 2014-09-14 MED ORDER — PHENAZOPYRIDINE HCL 200 MG PO TABS: 200.0000 mg | ORAL_TABLET | Freq: Three times a day (TID) | ORAL | Status: DC

## 2014-09-14 MED ORDER — KETOROLAC TROMETHAMINE 30 MG/ML IJ SOLN
30.0000 mg | Freq: Once | INTRAMUSCULAR | Status: AC
  Administered 2014-09-14: 30 mg via INTRAVENOUS
  Filled 2014-09-14: qty 1

## 2014-09-14 MED ORDER — DEXTROSE 5 % IV SOLN
1.0000 g | Freq: Once | INTRAVENOUS | Status: AC
  Administered 2014-09-14: 1 g via INTRAVENOUS
  Filled 2014-09-14: qty 10

## 2014-09-14 MED ORDER — SODIUM CHLORIDE 0.9 % IV BOLUS (SEPSIS)
1000.0000 mL | Freq: Once | INTRAVENOUS | Status: AC
  Administered 2014-09-14: 1000 mL via INTRAVENOUS

## 2014-09-14 MED ORDER — ONDANSETRON HCL 4 MG/2ML IJ SOLN
4.0000 mg | Freq: Once | INTRAMUSCULAR | Status: AC
  Administered 2014-09-14: 4 mg via INTRAVENOUS
  Filled 2014-09-14: qty 2

## 2014-09-14 MED ORDER — LIDOCAINE HCL 2 % EX GEL
1.0000 "application " | Freq: Once | CUTANEOUS | Status: DC
  Filled 2014-09-14: qty 10

## 2014-09-14 NOTE — Discharge Instructions (Signed)
Dysuria  Dysuria is the medical term for pain with urination. There are many causes for dysuria, but urinary tract infection is the most common. If a urinalysis was performed it can show that there is a urinary tract infection. A urine culture confirms that you or your child is sick. You will need to follow up with a healthcare provider because:  · If a urine culture was done you will need to know the culture results and treatment recommendations.  · If the urine culture was positive, you or your child will need to be put on antibiotics or know if the antibiotics prescribed are the right antibiotics for your urinary tract infection.  · If the urine culture is negative (no urinary tract infection), then other causes may need to be explored or antibiotics need to be stopped.  Today laboratory work may have been done and there does not seem to be an infection. If cultures were done they will take at least 24 to 48 hours to be completed.  Today x-rays may have been taken and they read as normal. No cause can be found for the problems. The x-rays may be re-read by a radiologist and you will be contacted if additional findings are made.  You or your child may have been put on medications to help with this problem until you can see your primary caregiver. If the problems get better, see your primary caregiver if the problems return. If you were given antibiotics (medications which kill germs), take all of the mediations as directed for the full course of treatment.   If laboratory work was done, you need to find the results. Leave a telephone number where you can be reached. If this is not possible, make sure you find out how you are to get test results.  HOME CARE INSTRUCTIONS   · Drink lots of fluids. For adults, drink eight, 8 ounce glasses of clear juice or water a day. For children, replace fluids as suggested by your caregiver.  · Empty the bladder often. Avoid holding urine for long periods of time.  · After a bowel  movement, women should cleanse front to back, using each tissue only once.  · Empty your bladder before and after sexual intercourse.  · Take all the medicine given to you until it is gone. You may feel better in a few days, but TAKE ALL MEDICINE.  · Avoid caffeine, tea, alcohol and carbonated beverages, because they tend to irritate the bladder.  · In men, alcohol may irritate the prostate.  · Only take over-the-counter or prescription medicines for pain, discomfort, or fever as directed by your caregiver.  · If your caregiver has given you a follow-up appointment, it is very important to keep that appointment. Not keeping the appointment could result in a chronic or permanent injury, pain, and disability. If there is any problem keeping the appointment, you must call back to this facility for assistance.  SEEK IMMEDIATE MEDICAL CARE IF:   · Back pain develops.  · A fever develops.  · There is nausea (feeling sick to your stomach) or vomiting (throwing up).  · Problems are no better with medications or are getting worse.  MAKE SURE YOU:   · Understand these instructions.  · Will watch your condition.  · Will get help right away if you are not doing well or get worse.  Document Released: 11/14/2003 Document Revised: 05/10/2011 Document Reviewed: 09/21/2007  ExitCare® Patient Information ©2015 [employer]. This information is not   intended to replace advice given to you by your health care provider. Make sure you discuss any questions you have with your health care provider.  Urinary Tract Infection  Urinary tract infections (UTIs) can develop anywhere along your urinary tract. Your urinary tract is your body's drainage system for removing wastes and extra water. Your urinary tract includes two kidneys, two ureters, a bladder, and a urethra. Your kidneys are a pair of bean-shaped organs. Each kidney is about the size of your fist. They are located below your ribs, one on each side of your spine.  CAUSES  Infections  are caused by microbes, which are microscopic organisms, including fungi, viruses, and bacteria. These organisms are so small that they can only be seen through a microscope. Bacteria are the microbes that most commonly cause UTIs.  SYMPTOMS   Symptoms of UTIs may vary by age and gender of the patient and by the location of the infection. Symptoms in young women typically include a frequent and intense urge to urinate and a painful, burning feeling in the bladder or urethra during urination. Older women and men are more likely to be tired, shaky, and weak and have muscle aches and abdominal pain. A fever may mean the infection is in your kidneys. Other symptoms of a kidney infection include pain in your back or sides below the ribs, nausea, and vomiting.  DIAGNOSIS  To diagnose a UTI, your caregiver will ask you about your symptoms. Your caregiver also will ask to provide a urine sample. The urine sample will be tested for bacteria and white blood cells. White blood cells are made by your body to help fight infection.  TREATMENT   Typically, UTIs can be treated with medication. Because most UTIs are caused by a bacterial infection, they usually can be treated with the use of antibiotics. The choice of antibiotic and length of treatment depend on your symptoms and the type of bacteria causing your infection.  HOME CARE INSTRUCTIONS  · If you were prescribed antibiotics, take them exactly as your caregiver instructs you. Finish the medication even if you feel better after you have only taken some of the medication.  · Drink enough water and fluids to keep your urine clear or pale yellow.  · Avoid caffeine, tea, and carbonated beverages. They tend to irritate your bladder.  · Empty your bladder often. Avoid holding urine for long periods of time.  · Empty your bladder before and after sexual intercourse.  · After a bowel movement, women should cleanse from front to back. Use each tissue only once.  SEEK MEDICAL CARE  IF:   · You have back pain.  · You develop a fever.  · Your symptoms do not begin to resolve within 3 days.  SEEK IMMEDIATE MEDICAL CARE IF:   · You have severe back pain or lower abdominal pain.  · You develop chills.  · You have nausea or vomiting.  · You have continued burning or discomfort with urination.  MAKE SURE YOU:   · Understand these instructions.  · Will watch your condition.  · Will get help right away if you are not doing well or get worse.  Document Released: 11/25/2004 Document Revised: 08/17/2011 Document Reviewed: 03/26/2011  ExitCare® Patient Information ©2015 [employer]. This information is not intended to replace advice given to you by your health care provider. Make sure you discuss any questions you have with your health care provider.

## 2014-09-14 NOTE — ED Notes (Addendum)
Pt states "I feel like my penis is going to explode." States he hasn't been able to urinate much since this morning. States he started feeling some back pain last week, but didn't pay any attention to it. States whenever he tries to urinate he feels a tearing/sharp excruciating pain. States "my whole body tenses up when I pee." States urine is very dark and only able to come out in small amounts, denies blood in urine.

## 2014-09-14 NOTE — ED Provider Notes (Signed)
CSN: [id number]     Arrival date & time 09/14/14  1646 History   First MD Initiated Contact with Patient 09/14/14 1705     Chief Complaint  Patient presents with  . Dysuria     (Consider location/radiation/quality/duration/timing/severity/associated sxs/prior Treatment) HPI   [name] is a 39 year old male with history of diabetes, who presents to the ER today for sudden onset dysuria which occurred approximately 9 AM this morning.  Prior to this he had had one episode of I'm painful urination with markedly dark urine, him and his wife remarked that he needed to drink more fluids today.  He went to work when he had this sudden onset dysuria, with increased urgency and frequency. He has had difficulty initiating his stream and the pain is worse near the end of urination. He had multiple episodes of dribbling.  He had one episode of urination which was so painful he felt like he was went to pass out.  He describes the pain "like my penis is going to explode," and it is rated 10 out of 10.  In an effort to treat the pain in the coccyx. Urine he drank approximately 100 ounces of water and 3 small containers of cranberry juice.  He had an improved urinary stream but continued to have dysuria.  He denies any associated abdominal pain, further denies any nausea, vomiting, diarrhea, fever, chills, sweats.   Prior to this morning, the only other symptom he can recall is an intermittent episode of right flank pain that happened 2 days ago.  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  It resolved spontaneously.  He denies any STD exposure, is in a monogamous relationship denies any penile discharge, erythema or irritation prior to today.  He denies any testicular or groin pain, swelling or erythema .  He has had no history of lower urinary tract symptoms, has no personal or family history of BPH.  Past Medical History  Diagnosis Date  . Chicken pox   .  Diabetes mellitus without complication   . Increased frequency of headaches    Past Surgical History  Procedure Laterality Date  . Incise and drain abcess      left hand and jaw   Family History  Problem Relation Age of Onset  . Heart disease Mother   . Diabetes Mother   . Hyperlipidemia Father   . Stroke Father   . Hypertension Father   . Lung cancer Father   . Cancer Paternal Grandfather     prostate   History  Substance Use Topics  . Smoking status: Current Every Day Smoker -- 1.00 packs/day for 29 years    Types: Cigarettes  . Smokeless tobacco: Never Used  . Alcohol Use: No    Review of Systems  10 Systems reviewed and are negative for acute change except as noted in the HPI.     Allergies  Review of patient's allergies indicates no known allergies.  Home Medications   Prior to Admission medications   Medication Sig Start Date End Date Taking? Authorizing Provider  gemfibrozil (LOPID) 600 MG tablet Take 1 tablet (600 mg total) by mouth 2 (two) times daily before a meal. 02/26/14  Yes Wallis Bamberg, PA-C  ibuprofen (ADVIL,MOTRIN) 200 MG tablet Take 600 mg by mouth every 6 (six) hours as needed for moderate pain.   Yes Historical Provider, MD  metFORMIN (GLUCOPHAGE) 1000 MG tablet Take 1 tablet (1,000 mg total) by mouth 2 (  two) times daily with a meal. Patient taking differently: Take 500 mg by mouth 2 (two) times daily with a meal.  02/25/14  Yes Wallis Bamberg, PA-C  glucose blood (ACCU-CHEK AVIVA PLUS) test strip Use as instructed 02/26/14   Morrell Riddle, PA-C  HYDROcodone-acetaminophen (NORCO/VICODIN) 5-325 MG per tablet Take 1 tablet by mouth every 6 (six) hours as needed for moderate pain. Patient not taking: Reported on 09/14/2014 09/06/14   Elwin Mocha, MD   BP 107/55 mmHg  Pulse 82  Temp(Src) 98.3 F (36.8 C) (Oral)  Resp 16  SpO2 99% Physical Exam  Constitutional: He is oriented to person, place, and time. He appears well-developed and well-nourished. He  appears distressed.  HENT:  Head: Normocephalic and atraumatic.  Right Ear: External ear normal.  Left Ear: External ear normal.  Nose: Nose normal.  Mouth/Throat: Oropharynx is clear and moist. No oropharyngeal exudate.  Eyes: Conjunctivae, EOM and lids are normal. Pupils are equal, round, and reactive to light. Right eye exhibits no discharge. Left eye exhibits no discharge. No scleral icterus.  Neck: Normal range of motion. No JVD present. No tracheal deviation present. No thyromegaly present.  Cardiovascular: Normal rate, regular rhythm, normal heart sounds and intact distal pulses.  Exam reveals no gallop and no friction rub.   No murmur heard. Pulmonary/Chest: Effort normal and breath sounds normal. No respiratory distress. He has no wheezes. He has no rales. He exhibits no tenderness.  Abdominal: Soft. Normal appearance and bowel sounds are normal. He exhibits no distension and no mass. There is no hepatosplenomegaly. There is no tenderness. There is no rigidity, no rebound, no guarding and no CVA tenderness.  Genitourinary: Rectum normal, prostate normal and testes normal. Prostate is not enlarged and not tender. Right testis shows no tenderness. Left testis shows no tenderness. Uncircumcised. Penile tenderness present. No phimosis, paraphimosis, hypospadias or penile erythema. No discharge found.  Musculoskeletal: Normal range of motion. He exhibits no edema or tenderness.  Lymphadenopathy:    He has no cervical adenopathy.  Neurological: He is alert and oriented to person, place, and time. He has normal reflexes. No cranial nerve deficit. He exhibits normal muscle tone. Coordination normal.  Skin: Skin is warm, dry and intact. No rash noted. He is not diaphoretic. No cyanosis or erythema. No pallor. Nails show no clubbing.  Psychiatric: He has a normal mood and affect. His behavior is normal. Judgment and thought content normal.  Nursing note and vitals reviewed.  ED Course   Procedures (including critical care time) Labs Review Labs Reviewed  URINALYSIS, ROUTINE W REFLEX MICROSCOPIC (NOT AT [facility]) - Abnormal; Notable for the following:    Glucose, UA >1000 (*)    Hgb urine dipstick SMALL (*)    Leukocytes, UA TRACE (*)    All other components within normal limits  BASIC METABOLIC PANEL - Abnormal; Notable for the following:    Glucose, Bld 248 (*)    Calcium 8.6 (*)    All other components within normal limits  CBC WITH DIFFERENTIAL/PLATELET - Abnormal; Notable for the following:    WBC 12.0 (*)    Neutrophils Relative % 79 (*)    Neutro Abs 9.5 (*)    All other components within normal limits  URINE MICROSCOPIC-ADD ON - Abnormal; Notable for the following:    Bacteria, UA FEW (*)    All other components within normal limits  CK   Imaging Review Ct Abdomen Pelvis Wo Contrast  09/14/2014   CLINICAL DATA:  Penile  pain and difficulty urinating, left flank pain  EXAM: CT ABDOMEN AND PELVIS WITHOUT CONTRAST  TECHNIQUE: Multidetector CT imaging of the abdomen and pelvis was performed following the standard protocol without IV contrast.  COMPARISON:  None.  FINDINGS: Lung bases are free of acute infiltrate or sizable effusion.  The liver, gallbladder, spleen, adrenal glands and pancreas are within normal limits with the exception of a few splenic granulomas. The kidneys are well visualized bilaterally and reveal no renal calculi or urinary tract obstructive changes. The ureters are within normal limits.  The appendix is well visualized and within normal limits. The bladder is well distended. No free pelvic fluid or significant lymphadenopathy is identified. The osseous structures show no acute abnormality. Prostatic calcifications are noted.  IMPRESSION: No acute abnormality noted.   Electronically Signed   By: Alcide CleverMark  Lukens M.D.   On: 09/14/2014 18:22     EKG Interpretation None      MDM   Final diagnoses:  Left flank pain    Pt with dysuria - ddx:  UTI,  Pyelo, Nephrolithiasis, prostatitis, urethritis, STD  Plan:  CBC, BMP, urine, CT abd/pelvis, CK (for reported brown urine) Will give IV dilaudid and hold IVF for now, pt has reportedly taken in ample fluid today, is urinating frequently, which is causing his pain - would like to r/o obstruction before trying toradol.  CT is negative for stone, no hydronephrosis no hydroureter.  Patient continues to have severe pain with micturition.  Urine is clear, light yellow.  Urinalysis is positive for glucosuria, small hemoglobin, trace leukocytes, few bacteria and negative nitrites.  Patient was given Toradol, without any improvement.  Dilaudid dose was repeated, and IV fluids given.  Patient's symptoms seem most consistent with BPH- LUTS/prostatitis/urethritis - prostate exam normal without tenderness, urethral swab obtained, and Urojet lidocaine with mild clinical response - pain somewhat decreased.    Patient's entire workup is only significant for possible mild UTI, STD testing is pending. UTI does not explain his severe dysuria.  He received 1 g of ceftriaxone here with IV fluids.  He has mild leukocytosis and hyperglycemia.  Patient appears very uncomfortable, but is nontoxic appearing.  He is not febrile, he has had no tachycardia.    Plan is for the patient to DC home tonight with Keflex, pain meds, and Pyridium, with urology follow-up if not improving.  Patient was given strict return precautions if verbalizes understanding, his wife is at the bedside and all are in agreement with the plan.       Danelle BerryLeisa Haisley Arens, PA-C 09/15/14 0151  Richardean Canalavid H Yao, MD 09/15/14 [phone]

## 2014-09-14 NOTE — ED Notes (Signed)
Questions r/t dc were denied. Pt is ambulatory and a&ox4 

## 2014-09-15 LAB — RPR: RPR Ser Ql: NONREACTIVE

## 2014-09-16 LAB — GC/CHLAMYDIA PROBE AMP (~~LOC~~) NOT AT ARMC
Chlamydia: NEGATIVE
NEISSERIA GONORRHEA: NEGATIVE

## 2014-09-28 ENCOUNTER — Emergency Department (HOSPITAL_COMMUNITY)
Admission: EM | Admit: 2014-09-28 | Discharge: 2014-09-28 | Disposition: A | Discharge status: Home / Self Care | Payer: 59 | Attending: Emergency Medicine | Admitting: Emergency Medicine

## 2014-09-28 ENCOUNTER — Encounter (HOSPITAL_COMMUNITY): Payer: Self-pay | Admitting: Emergency Medicine

## 2014-09-28 DIAGNOSIS — Z720 Tobacco use: Secondary | ICD-10-CM | POA: Insufficient documentation

## 2014-09-28 DIAGNOSIS — R300 Dysuria: Secondary | ICD-10-CM | POA: Diagnosis not present

## 2014-09-28 DIAGNOSIS — E119 Type 2 diabetes mellitus without complications: Secondary | ICD-10-CM | POA: Insufficient documentation

## 2014-09-28 DIAGNOSIS — Z8619 Personal history of other infectious and parasitic diseases: Secondary | ICD-10-CM | POA: Insufficient documentation

## 2014-09-28 DIAGNOSIS — Z79899 Other long term (current) drug therapy: Secondary | ICD-10-CM | POA: Diagnosis not present

## 2014-09-28 DIAGNOSIS — R319 Hematuria, unspecified: Secondary | ICD-10-CM | POA: Insufficient documentation

## 2014-09-28 DIAGNOSIS — Z792 Long term (current) use of antibiotics: Secondary | ICD-10-CM | POA: Insufficient documentation

## 2014-09-28 LAB — I-STAT CHEM 8, ED
BUN: 9 mg/dL (ref 6–20)
CALCIUM ION: 1.17 mmol/L (ref 1.12–1.23)
CHLORIDE: 106 mmol/L (ref 101–111)
Creatinine, Ser: 0.9 mg/dL (ref 0.61–1.24)
Glucose, Bld: 316 mg/dL — ABNORMAL HIGH (ref 65–99)
HEMATOCRIT: 47 % (ref 39.0–52.0)
HEMOGLOBIN: 16 g/dL (ref 13.0–17.0)
Potassium: 4.3 mmol/L (ref 3.5–5.1)
SODIUM: 139 mmol/L (ref 135–145)
TCO2: 21 mmol/L (ref 0–100)

## 2014-09-28 LAB — URINE MICROSCOPIC-ADD ON

## 2014-09-28 LAB — URINALYSIS, ROUTINE W REFLEX MICROSCOPIC
BILIRUBIN URINE: NEGATIVE
Glucose, UA: >1000 mg/dL — AB
KETONES UR: NEGATIVE mg/dL
Nitrite: NEGATIVE
Protein, ur: 100 mg/dL — AB
Specific Gravity, Urine: 1.026 (ref 1.005–1.030)
UROBILINOGEN UA: 0.2 mg/dL (ref 0.0–1.0)
pH: 5.5 (ref 5.0–8.0)

## 2014-09-28 MED ORDER — PHENAZOPYRIDINE HCL 200 MG PO TABS
200.0000 mg | ORAL_TABLET | Freq: Three times a day (TID) | ORAL | Status: DC
Start: 2014-09-28 — End: 2015-04-02

## 2014-09-28 MED ORDER — PHENAZOPYRIDINE HCL 200 MG PO TABS
200.0000 mg | ORAL_TABLET | Freq: Three times a day (TID) | ORAL | Status: DC
  Administered 2014-09-28: 200 mg via ORAL
  Filled 2014-09-28: qty 1

## 2014-09-28 MED ORDER — HYDROMORPHONE HCL 1 MG/ML IJ SOLN
1.0000 mg | Freq: Once | INTRAMUSCULAR | Status: AC
  Administered 2014-09-28: 1 mg via INTRAVENOUS
  Filled 2014-09-28: qty 1

## 2014-09-28 MED ORDER — LEVOFLOXACIN 750 MG PO TABS: 750.0000 mg | ORAL_TABLET | Freq: Every day | ORAL | Status: DC

## 2014-09-28 MED ORDER — HYDROCODONE-ACETAMINOPHEN 5-325 MG PO TABS: 2.0000 | ORAL_TABLET | ORAL | Status: DC | PRN

## 2014-09-28 NOTE — ED Notes (Signed)
Pt arrived to the ED with a complaint of hematuria.  Pt also has urinary frequency.  Pt was seen a week ago for a similar symptom.  Pt is not urinating blood clots.

## 2014-09-28 NOTE — ED Notes (Signed)
PA at bedside.

## 2014-09-28 NOTE — Discharge Instructions (Signed)
Please take your antibiotics and other medications as prescribed. It is also important for you to follow-up with urology for further evaluation and definitive care. Return to ED for new or worsening symptoms.  Dysuria Dysuria is the medical term for pain with urination. There are many causes for dysuria, but urinary tract infection is the most common. If a urinalysis was performed it can show that there is a urinary tract infection. A urine culture confirms that you or your child is sick. You will need to follow up with a healthcare provider because:  If a urine culture was done you will need to know the culture results and treatment recommendations.  If the urine culture was positive, you or your child will need to be put on antibiotics or know if the antibiotics prescribed are the right antibiotics for your urinary tract infection.  If the urine culture is negative (no urinary tract infection), then other causes may need to be explored or antibiotics need to be stopped. Today laboratory work may have been done and there does not seem to be an infection. If cultures were done they will take at least 24 to 48 hours to be completed. Today x-rays may have been taken and they read as normal. No cause can be found for the problems. The x-rays may be re-read by a radiologist and you will be contacted if additional findings are made. You or your child may have been put on medications to help with this problem until you can see your primary caregiver. If the problems get better, see your primary caregiver if the problems return. If you were given antibiotics (medications which kill germs), take all of the mediations as directed for the full course of treatment.  If laboratory work was done, you need to find the results. Leave a telephone number where you can be reached. If this is not possible, make sure you find out how you are to get test results. HOME CARE INSTRUCTIONS   Drink lots of fluids. For adults,  drink eight, 8 ounce glasses of clear juice or water a day. For children, replace fluids as suggested by your caregiver.  Empty the bladder often. Avoid holding urine for long periods of time.  After a bowel movement, women should cleanse front to back, using each tissue only once.  Empty your bladder before and after sexual intercourse.  Take all the medicine given to you until it is gone. You may feel better in a few days, but TAKE ALL MEDICINE.  Avoid caffeine, tea, alcohol and carbonated beverages, because they tend to irritate the bladder.  In men, alcohol may irritate the prostate.  Only take over-the-counter or prescription medicines for pain, discomfort, or fever as directed by your caregiver.  If your caregiver has given you a follow-up appointment, it is very important to keep that appointment. Not keeping the appointment could result in a chronic or permanent injury, pain, and disability. If there is any problem keeping the appointment, you must call back to this facility for assistance. SEEK IMMEDIATE MEDICAL CARE IF:   Back pain develops.  A fever develops.  There is nausea (feeling sick to your stomach) or vomiting (throwing up).  Problems are no better with medications or are getting worse. MAKE SURE YOU:   Understand these instructions.  Will watch your condition.  Will get help right away if you are not doing well or get worse. Document Released: 11/14/2003 Document Revised: 05/10/2011 Document Reviewed: 09/21/2007 Exit[employer] Patient Information 2015 [place] [employer]. This information is not intended to replace advice given to you by your health care provider. Make sure you discuss any questions you have with your health care provider.  Hematuria Hematuria is blood in your urine. It can be caused by a bladder infection, kidney infection, prostate infection, kidney stone, or cancer of your urinary tract. Infections can usually be treated with medicine, and a kidney  stone usually will pass through your urine. If neither of these is the cause of your hematuria, further workup to find out the reason may be needed. It is very important that you tell your health care provider about any blood you see in your urine, even if the blood stops without treatment or happens without causing pain. Blood in your urine that happens and then stops and then happens again can be a symptom of a very serious condition. Also, pain is not a symptom in the initial stages of many urinary cancers. HOME CARE INSTRUCTIONS   Drink lots of fluid, 3-4 quarts a day. If you have been diagnosed with an infection, cranberry juice is especially recommended, in addition to large amounts of water.  Avoid caffeine, tea, and carbonated beverages because they tend to irritate the bladder.  Avoid alcohol because it may irritate the prostate.  Take all medicines as directed by your health care provider.  If you were prescribed an antibiotic medicine, finish it all even if you start to feel better.  If you have been diagnosed with a kidney stone, follow your health care provider's instructions regarding straining your urine to catch the stone.  Empty your bladder often. Avoid holding urine for long periods of time.  After a bowel movement, women should cleanse front to back. Use each tissue only once.  Empty your bladder before and after sexual intercourse if you are a male. SEEK MEDICAL CARE IF:  You develop back pain.  You have a fever.  You have a feeling of sickness in your stomach (nausea) or vomiting.  Your symptoms are not better in 3 days. Return sooner if you are getting worse. SEEK IMMEDIATE MEDICAL CARE IF:   You develop severe vomiting and are unable to keep the medicine down.  You develop severe back or abdominal pain despite taking your medicines.  You begin passing a large amount of blood or clots in your urine.  You feel extremely weak or faint, or you pass  out. MAKE SURE YOU:   Understand these instructions.  Will watch your condition.  Will get help right away if you are not doing well or get worse. Document Released: 02/15/2005 Document Revised: 07/02/2013 Document Reviewed: 10/16/2012 [facility] Patient Information 2015 [place], [place]. This information is not intended to replace advice given to you by your health care provider. Make sure you discuss any questions you have with your health care provider.

## 2014-09-28 NOTE — ED Provider Notes (Signed)
CSN: [id number]     Arrival date & time 09/28/14  0540 History   First MD Initiated Contact with Patient 09/28/14 [phone]     Chief Complaint  Patient presents with  . Hematuria     (Consider location/radiation/quality/duration/timing/severity/associated sxs/prior Treatment) HPI Edward [name] is a 39 y.o. male with history of diabetes, comes in for evaluation of dysuria and hematuria. Patient states he had the same problem last week, was seen in the ED and discharged with Keflex and Azo which "cleared up a problem completely". However, he reports this morning at 4:30 AM his symptoms returned and he started urinating gross blood. He does report associated dysuria and discomfort at the head of his penis. He reports associated frequency and has paid "16 times this morning". He denies any overt abdominal pain, rectal pain, fevers or chills, nausea or vomiting, or urinating blood clots.  Past Medical History  Diagnosis Date  . Chicken pox   . Diabetes mellitus without complication   . Increased frequency of headaches    Past Surgical History  Procedure Laterality Date  . Incise and drain abcess      left hand and jaw   Family History  Problem Relation Age of Onset  . Heart disease Mother   . Diabetes Mother   . Hyperlipidemia Father   . Stroke Father   . Hypertension Father   . Lung cancer Father   . Cancer Paternal Grandfather     prostate   History  Substance Use Topics  . Smoking status: Current Every Day Smoker -- 1.00 packs/day for 29 years    Types: Cigarettes  . Smokeless tobacco: Never Used  . Alcohol Use: No    Review of Systems A 10 point review of systems was completed and was negative except for pertinent positives and negatives as mentioned in the history of present illness     Allergies  Review of patient's allergies indicates no known allergies.  Home Medications   Prior to Admission medications   Medication Sig Start Date End Date Taking?  Authorizing Provider  cephALEXin (KEFLEX) 500 MG capsule Take 1 capsule (500 mg total) by mouth 4 (four) times daily. 09/14/14  Yes Danelle Berry, PA-C  metFORMIN (GLUCOPHAGE) 1000 MG tablet Take 1 tablet (1,000 mg total) by mouth 2 (two) times daily with a meal. Patient taking differently: Take 500 mg by mouth 2 (two) times daily with a meal.  02/25/14  Yes Wallis Bamberg, PA-C  gemfibrozil (LOPID) 600 MG tablet Take 1 tablet (600 mg total) by mouth 2 (two) times daily before a meal. 02/26/14   Wallis Bamberg, PA-C  glucose blood (ACCU-CHEK AVIVA PLUS) test strip Use as instructed 02/26/14   Morrell Riddle, PA-C  HYDROcodone-acetaminophen (NORCO) 5-325 MG per tablet Take 2 tablets by mouth every 4 (four) hours as needed. 09/28/14   Joycie Peek, PA-C  ibuprofen (ADVIL,MOTRIN) 200 MG tablet Take 600 mg by mouth every 6 (six) hours as needed for moderate pain.    Historical Provider, MD  ketorolac (TORADOL) 10 MG tablet Take 1 tablet (10 mg total) by mouth every 6 (six) hours as needed. 09/14/14   Danelle Berry, PA-C  levofloxacin (LEVAQUIN) 750 MG tablet Take 1 tablet (750 mg total) by mouth daily. X 7 days 09/28/14   Joycie Peek, PA-C  phenazopyridine (PYRIDIUM) 200 MG tablet Take 1 tablet (200 mg total) by mouth 3 (three) times daily. 09/28/14   Joycie Peek, PA-C   BP 115/64 mmHg  Pulse 80  Temp(Src) 98.6 F (37 C) (Oral)  Resp 20  SpO2 97% Physical Exam  Constitutional: He is oriented to person, place, and time. He appears well-developed and well-nourished.  HENT:  Head: Normocephalic and atraumatic.  Mouth/Throat: Oropharynx is clear and moist.  Eyes: Conjunctivae are normal. Pupils are equal, round, and reactive to light. Right eye exhibits no discharge. Left eye exhibits no discharge. No scleral icterus.  Neck: Neck supple.  Cardiovascular: Normal rate, regular rhythm and normal heart sounds.   Pulmonary/Chest: Effort normal and breath sounds normal. No respiratory distress. He has no  wheezes. He has no rales.  Abdominal: Soft. There is no tenderness.  Genitourinary:  Normal external genitalia. Patient is noncircumcised, no evidence of phimosis or paraphimosis. No balantitis. No penile discharge. No tenderness to glands. No other erythema or other sign of infection. Rectal exam is unremarkable, no tender or boggy prostate.  Musculoskeletal: He exhibits no tenderness.  Neurological: He is alert and oriented to person, place, and time.  Cranial Nerves II-XII grossly intact  Skin: Skin is warm and dry. No rash noted.  Psychiatric: He has a normal mood and affect.  Nursing note and vitals reviewed.   ED Course  Procedures (including critical care time) Labs Review Labs Reviewed  URINALYSIS, ROUTINE W REFLEX MICROSCOPIC (NOT AT [facility]) - Abnormal; Notable for the following:    Color, Urine RED (*)    APPearance TURBID (*)    Glucose, UA >1000 (*)    Hgb urine dipstick LARGE (*)    Protein, ur 100 (*)    Leukocytes, UA MODERATE (*)    All other components within normal limits  I-STAT CHEM 8, ED - Abnormal; Notable for the following:    Glucose, Bld 316 (*)    All other components within normal limits  URINE MICROSCOPIC-ADD ON    Imaging Review No results found.   EKG Interpretation None     Meds given in ED:  Medications  phenazopyridine (PYRIDIUM) tablet 200 mg (200 mg Oral Given 09/28/14 0714)  HYDROmorphone (DILAUDID) injection 1 mg (1 mg Intravenous Given 09/28/14 0713)    Discharge Medication List as of 09/28/2014  7:40 AM    START taking these medications   Details  levofloxacin (LEVAQUIN) 750 MG tablet Take 1 tablet (750 mg total) by mouth daily. X 7 days, Starting 09/28/2014, Until Discontinued, Print       Filed Vitals:   09/28/14 0546 09/28/14 0741 09/28/14 0745  BP: 140/88 115/64 115/64  Pulse: 88 84 80  Temp: 97.8 F (36.6 C) 98.6 F (37 C) 98.6 F (37 C)  TempSrc: Oral Oral Oral  Resp: SpO2: 98% 97% 97%    MDM  Vitals  stable - WNL -afebrile Pt resting comfortably in ED. PE--physical exam as above and not concerning for emergent pathology. Labwork--hemoglobin baseline, creatinine baseline, urinalysis shows large hemoglobin and red cells with moderate leukocytes. Imaging--previous CT abdomen completed on 7/16 shows no evidence of stone, hydronephrosis or other acute intra-abdominal pathology.  Previous treatment regimen of Keflex and Pyridium relieved patient's symptoms temporarily. Plan is to change antibiotic to Levaquin and stressed the importance of patient follow-up with urology for subsequent cystoscopy. No evidence of other emergent infection, urosepsis, pyelonephritis, other abscess at this time.  I discussed all relevant lab findings and imaging results with pt and they verbalized understanding. Discussed f/u with PCP within 48 hrs and return precautions, pt very amenable to plan. Prior to patient discharge, I discussed and reviewed this  case with Dr. Mora Bellman    Final diagnoses:  Hematuria  Dysuria       Joycie Peek, PA-C 09/28/14 0981  Tomasita Crumble, MD 09/28/14 1515

## 2015-02-11 ENCOUNTER — Ambulatory Visit: Payer: 59 | Admitting: Physician Assistant

## 2015-03-21 ENCOUNTER — Other Ambulatory Visit: Payer: Self-pay | Admitting: Physician Assistant

## 2015-04-02 ENCOUNTER — Other Ambulatory Visit: Payer: Self-pay | Admitting: Family Medicine

## 2015-04-02 ENCOUNTER — Ambulatory Visit (INDEPENDENT_AMBULATORY_CARE_PROVIDER_SITE_OTHER): Payer: 59 | Admitting: Family Medicine

## 2015-04-02 ENCOUNTER — Telehealth: Payer: Self-pay

## 2015-04-02 VITALS — BP 120/80 | HR 85 | Temp 98.3°F | Resp 20 | Ht 68.0 in | Wt 192.6 lb

## 2015-04-02 DIAGNOSIS — J340 Abscess, furuncle and carbuncle of nose: Secondary | ICD-10-CM

## 2015-04-02 DIAGNOSIS — E1165 Type 2 diabetes mellitus with hyperglycemia: Secondary | ICD-10-CM | POA: Diagnosis not present

## 2015-04-02 DIAGNOSIS — IMO0001 Reserved for inherently not codable concepts without codable children: Secondary | ICD-10-CM

## 2015-04-02 DIAGNOSIS — E119 Type 2 diabetes mellitus without complications: Secondary | ICD-10-CM | POA: Diagnosis not present

## 2015-04-02 LAB — POCT GLYCOSYLATED HEMOGLOBIN (HGB A1C): Hemoglobin A1C: 10.9

## 2015-04-02 LAB — COMPLETE METABOLIC PANEL WITH GFR
ALT: 26 U/L (ref 9–46)
AST: 14 U/L (ref 10–40)
Albumin: 4.3 g/dL (ref 3.6–5.1)
Alkaline Phosphatase: 81 U/L (ref 40–115)
BUN: 8 mg/dL (ref 7–25)
CO2: 23 mmol/L (ref 20–31)
Calcium: 8.8 mg/dL (ref 8.6–10.3)
Chloride: 101 mmol/L (ref 98–110)
Creat: 0.72 mg/dL (ref 0.60–1.35)
GFR, Est African American: >89 mL/min (ref >=60)
GFR, Est Non African American: >89 mL/min (ref >=60)
Glucose, Bld: 375 mg/dL — ABNORMAL HIGH (ref 65–99)
Potassium: 4.2 mmol/L (ref 3.5–5.3)
Sodium: 136 mmol/L (ref 135–146)
Total Bilirubin: 0.4 mg/dL (ref 0.2–1.2)
Total Protein: 6.4 g/dL (ref 6.1–8.1)

## 2015-04-02 LAB — LIPID PANEL
Cholesterol: 171 mg/dL (ref 125–200)
HDL: 23 mg/dL — ABNORMAL LOW (ref >=40)
LDL Cholesterol: 81 mg/dL (ref <130)
Total CHOL/HDL Ratio: 7.4 Ratio — ABNORMAL HIGH (ref <=5.0)
Triglycerides: 336 mg/dL — ABNORMAL HIGH (ref <150)
VLDL: 67 mg/dL — ABNORMAL HIGH (ref <30)

## 2015-04-02 LAB — MICROALBUMIN, URINE: Microalb, Ur: 0.3 mg/dL

## 2015-04-02 MED ORDER — AMOXICILLIN-POT CLAVULANATE 875-125 MG PO TABS: 1.0000 | ORAL_TABLET | Freq: Two times a day (BID) | ORAL | Status: DC

## 2015-04-02 MED ORDER — HYDROCODONE-ACETAMINOPHEN 5-325 MG PO TABS: 1.0000 | ORAL_TABLET | Freq: Four times a day (QID) | ORAL | Status: DC | PRN

## 2015-04-02 MED ORDER — GLUCOSE BLOOD VI STRP: ORAL_STRIP | Status: DC

## 2015-04-02 MED FILL — AMOX-CLAV 875-125 MG TABLET: 875-125 | 10 days supply | Qty: 20 | Fill #0

## 2015-04-02 MED FILL — ACCU-CHEK AVIVA PLUS TEST S: 90 days supply | Qty: 100 | Fill #0

## 2015-04-02 MED FILL — HYDROCODON-APAP 5-325: 5-325 | 3 days supply | Qty: 15 | Fill #0

## 2015-04-02 NOTE — Progress Notes (Signed)
   Subjective:    Patient ID: Edward [name], male    DOB: 23-Jun-1975, 40 y.o.   MRN: [id number] By signing my name below, I, Edward [name], attest that this documentation has been prepared under the direction and in the presence of Edward Sidle, MD.  Electronically Signed: Littie [name], Medical Scribe. 04/02/2015. 8:15 AM.  HPI HPI Comments: [name] is a 40 y.o. male with a history of DM who presents to the [facility] complaining of a painful mass inside his right naris that started 2 days ago. Patient reports having associated erythema and swelling to the area, and he notes that the pain is constant. He believes he may have an infected area inside his nose. NKDA.  He has not had an A1c recently. He reports poor compliance with his medications, often forgetting to take them.  Patient works in [employer], but is currently interviewing for a job at [employer]. He has his second interview later this week.  Review of Systems  Skin: Positive for color change.       Objective:   Physical Exam CONSTITUTIONAL: Well developed/well nourished HEAD: Normocephalic/atraumatic. Nose red and swollen on the right side with a 5 mm swelling in the anterior septal aspect of his nasal passage. EYES: EOM/PERRL. Right eye watery and mildly injected. ENMT: Mucous membranes moist NECK: supple no meningeal signs SPINE: entire spine nontender CV: S1/S2 noted, no murmurs/rubs/gallops noted LUNGS: Lungs are clear to auscultation bilaterally, no apparent distress ABDOMEN: soft, nontender, no rebound or guarding GU: no cva tenderness NEURO: Pt is awake/alert, moves all extremitiesx4 EXTREMITIES: pulses normal, full ROM SKIN: warm, color normal PSYCH: no abnormalities of mood noted         Assessment & Plan:   This chart was scribed in my presence and reviewed by me personally.    ICD-9-CM ICD-10-CM   1. Cellulitis of nasal tip 682.0 J34.0 amoxicillin-clavulanate  (AUGMENTIN) 875-125 MG tablet     HYDROcodone-acetaminophen (NORCO) 5-325 MG tablet     Care order/instruction     POCT glycosylated hemoglobin (Hb A1C)     COMPLETE METABOLIC PANEL WITH GFR     Lipid panel  2. Uncontrolled type 2 diabetes mellitus without complication, without long-term current use of insulin (HCC) 250.02 E11.65 glucose blood (ACCU-CHEK AVIVA PLUS) test strip     Microalbumin, urine     Signed, Edward Sidle, MD

## 2015-04-02 NOTE — Telephone Encounter (Signed)
lmom that was being referred because of diabetes.  He is uncontrolled.

## 2015-04-02 NOTE — Telephone Encounter (Signed)
Patient is confused about why he was referred to endocrinologist. Patient was seen today and he was not aware that he was being referred somewhere before he left. Please call!

## 2015-04-02 NOTE — Patient Instructions (Signed)

## 2015-04-03 ENCOUNTER — Other Ambulatory Visit: Payer: Self-pay | Admitting: Family Medicine

## 2015-04-03 ENCOUNTER — Encounter: Payer: Self-pay | Admitting: Family Medicine

## 2015-04-03 DIAGNOSIS — J340 Abscess, furuncle and carbuncle of nose: Secondary | ICD-10-CM

## 2015-04-04 ENCOUNTER — Ambulatory Visit (INDEPENDENT_AMBULATORY_CARE_PROVIDER_SITE_OTHER): Payer: 59 | Admitting: Family Medicine

## 2015-04-04 ENCOUNTER — Ambulatory Visit: Payer: Self-pay | Admitting: Internal Medicine

## 2015-04-04 ENCOUNTER — Telehealth: Payer: Self-pay

## 2015-04-04 VITALS — BP 138/86 | HR 91 | Temp 98.3°F | Resp 16 | Wt 188.4 lb

## 2015-04-04 DIAGNOSIS — J3489 Other specified disorders of nose and nasal sinuses: Secondary | ICD-10-CM

## 2015-04-04 DIAGNOSIS — J340 Abscess, furuncle and carbuncle of nose: Secondary | ICD-10-CM | POA: Diagnosis not present

## 2015-04-04 DIAGNOSIS — N481 Balanitis: Secondary | ICD-10-CM | POA: Diagnosis not present

## 2015-04-04 DIAGNOSIS — E118 Type 2 diabetes mellitus with unspecified complications: Secondary | ICD-10-CM | POA: Diagnosis not present

## 2015-04-04 LAB — POCT CBC
Granulocyte percent: 67.4 %G (ref 37–80)
HEMATOCRIT: 48.3 % (ref 43.5–53.7)
HEMOGLOBIN: 17.3 g/dL (ref 14.1–18.1)
LYMPH, POC: 2.3 (ref 0.6–3.4)
MCH, POC: 32.5 pg — AB (ref 27–31.2)
MCHC: 35.7 g/dL — AB (ref 31.8–35.4)
MCV: 91 fL (ref 80–97)
MID (CBC): 0.3 (ref 0–0.9)
MPV: 8.1 fL (ref 0–99.8)
POC GRANULOCYTE: 5.4 (ref 2–6.9)
POC LYMPH %: 28.8 % (ref 10–50)
POC MID %: 3.8 % (ref 0–12)
Platelet Count, POC: 245 10*3/uL (ref 142–424)
RBC: 5.3 M/uL (ref 4.69–6.13)
RDW, POC: 13 %
WBC: 8 10*3/uL (ref 4.6–10.2)

## 2015-04-04 LAB — GLUCOSE, POCT (MANUAL RESULT ENTRY): POC GLUCOSE: 286 mg/dL — AB (ref 70–99)

## 2015-04-04 MED ORDER — CEFTRIAXONE SODIUM 1 G IJ SOLR
1.0000 g | Freq: Once | INTRAMUSCULAR | Status: AC
  Administered 2015-04-04: 1 g via INTRAMUSCULAR

## 2015-04-04 MED ORDER — FLUCONAZOLE 150 MG PO TABS: 150.0000 mg | ORAL_TABLET | Freq: Once | ORAL | Status: DC

## 2015-04-04 MED ORDER — HYDROCODONE-ACETAMINOPHEN 5-325 MG PO TABS: 1.0000 | ORAL_TABLET | Freq: Four times a day (QID) | ORAL | Status: DC | PRN

## 2015-04-04 MED FILL — HYDROCODON-APAP 5-325: 5-325 | 3 days supply | Qty: 15 | Fill #0

## 2015-04-04 MED FILL — FLUCONAZOLE 150 MG TABLET: 150 | 2 days supply | Qty: 2 | Fill #0

## 2015-04-04 NOTE — Progress Notes (Signed)
Patient ID: Edward [name], male    DOB: 02/23/1976  Age: 40 y.o. MRN: [id number]  Chief Complaint  Patient presents with  . Follow-up    still in pain, possible yeast infection  . Medication Refill    Norco    Subjective:   Patient was here couple of days ago with an infection of the tip of the nose. Augmentin his been taken for the last 2 days, 4 doses total. He continues to hurt badly. He is getting a yeast infection around his penis from antibiotic. He has not been taking his Glucophage at all.  Current allergies, medications, problem list, past/family and social histories reviewed.  Objective:  BP 138/86 mmHg  Pulse 91  Temp(Src) 98.3 F (36.8 C)  Resp 16  Wt 188 lb 6.4 oz (85.458 kg)  SpO2 98%  Erythema on tip of nose and right side of nose. Mild swelling of the right side of the nose. TMs normal. Throat clear. No palpable fluctuance in the nose or upper lip. No redness in the nose.  Results for orders placed or performed in visit on 04/04/15  POCT CBC  Result Value Ref Range   WBC 8.0 4.6 - 10.2 K/uL   Lymph, poc 2.3 0.6 - 3.4   POC LYMPH PERCENT 28.8 10 - 50 %L   MID (cbc) 0.3 0 - 0.9   POC MID % 3.8 0 - 12 %M   POC Granulocyte 5.4 2 - 6.9   Granulocyte percent 67.4 37 - 80 %G   RBC 5.30 4.69 - 6.13 M/uL   Hemoglobin 17.3 14.1 - 18.1 g/dL   HCT, POC 40.9 81.1 - 53.7 %   MCV 91.0 80 - 97 fL   MCH, POC 32.5 (A) 27 - 31.2 pg   MCHC 35.7 (A) 31.8 - 35.4 g/dL   RDW, POC 91.4 %   Platelet Count, POC 245 142 - 424 K/uL   MPV 8.1 0 - 99.8 fL  POCT glucose (manual entry)  Result Value Ref Range   POC Glucose 286 (A) 70 - 99 mg/dl     Assessment & Plan:   Assessment: 1. Cellulitis of nasal tip   2. Pain, nose   3. Type 2 diabetes mellitus with complication, without long-term current use of insulin (HCC)   4. Balanitis       Plan: Give a shot of ceftriaxone. Recheck again in 2 days, sooner if worse. Insisted take his diabetic medicine.  Orders Placed  This Encounter  Procedures  . POCT CBC  . POCT glucose (manual entry)    Meds ordered this encounter  Medications  . cefTRIAXone (ROCEPHIN) injection 1 g    Sig:     Order Specific Question:  Antibiotic Indication:    Answer:  Cellulitis  . fluconazole (DIFLUCAN) 150 MG tablet    Sig: Take 1 tablet (150 mg total) by mouth once.    Dispense:  2 tablet    Refill:  1         Patient Instructions  Very important that you take your diabetes medicine. If your sugar continues to run very high it is hard to treat infection.  Continue taking the Augmentin  Return Sunday for a recheck, sooner if worse.  Take the fluconazole (Diflucan) 1 pill single dose for the yeast of the penis. May repeat in 2 days if needed.  Use the pain, hydrocodone, pills only when severe pain.     2 days  Heavenly Christine, MD 04/04/2015

## 2015-04-04 NOTE — Telephone Encounter (Signed)
PT req. Refill due to still being in pain from infection in nose// please call to consult  Original Order:  HYDROcodone-acetaminophen (NORCO) 5-325 MG per tablet [[id number]]         Pharmacy:  [facility] - [place], [place] - [address]         [phone]

## 2015-04-04 NOTE — Patient Instructions (Signed)
Very important that you take your diabetes medicine. If your sugar continues to run very high it is hard to treat infection.  Continue taking the Augmentin  Return Sunday for a recheck, sooner if worse.  Take the fluconazole (Diflucan) 1 pill single dose for the yeast of the penis. May repeat in 2 days if needed.  Use the pain, hydrocodone, pills only when severe pain.

## 2015-04-07 NOTE — Telephone Encounter (Signed)
Pt came into be seen and was given a RF.

## 2015-04-15 MED FILL — FLUCONAZOLE 150 MG TABLET: 150 | 2 days supply | Qty: 2 | Fill #1

## 2015-04-29 ENCOUNTER — Other Ambulatory Visit: Payer: Self-pay | Admitting: *Deleted

## 2015-04-29 ENCOUNTER — Encounter: Payer: Self-pay | Admitting: Internal Medicine

## 2015-04-29 ENCOUNTER — Ambulatory Visit (INDEPENDENT_AMBULATORY_CARE_PROVIDER_SITE_OTHER): Payer: 59 | Admitting: Internal Medicine

## 2015-04-29 VITALS — BP 118/70 | HR 86 | Temp 98.0°F | Resp 12 | Ht 68.0 in | Wt 186.6 lb

## 2015-04-29 DIAGNOSIS — E1142 Type 2 diabetes mellitus with diabetic polyneuropathy: Secondary | ICD-10-CM | POA: Diagnosis not present

## 2015-04-29 DIAGNOSIS — IMO0002 Reserved for concepts with insufficient information to code with codable children: Secondary | ICD-10-CM

## 2015-04-29 DIAGNOSIS — E1165 Type 2 diabetes mellitus with hyperglycemia: Secondary | ICD-10-CM

## 2015-04-29 MED ORDER — TRUEPLUS LANCETS 33G MISC: Status: DC

## 2015-04-29 MED ORDER — TRUE METRIX AIR GLUCOSE METER W/DEVICE KIT: 1.0000 | PACK | Freq: Every day | Status: DC

## 2015-04-29 MED ORDER — GLUCOSE BLOOD VI STRP: ORAL_STRIP | Status: DC

## 2015-04-29 MED ORDER — GLIPIZIDE 5 MG PO TABS: 5.0000 mg | ORAL_TABLET | Freq: Two times a day (BID) | ORAL | Status: DC

## 2015-04-29 MED FILL — TRUE METRIX BLOOD GLUCOSE M: W/DEVICE | 20 days supply | Qty: 1 | Fill #0

## 2015-04-29 MED FILL — TRUEplus LANCETS 30G MISC: 50 days supply | Qty: 100 | Fill #0

## 2015-04-29 MED FILL — glipiZIDE 5 MG TABS: 5 | 30 days supply | Qty: 60 | Fill #0

## 2015-04-29 MED FILL — TRUE METRIX GLUCOSE TEST ST: 50 days supply | Qty: 100 | Fill #0

## 2015-04-29 NOTE — Telephone Encounter (Signed)
Opened encounter in error  

## 2015-04-29 NOTE — Progress Notes (Signed)
Patient ID: Edward [name], male   DOB: 11-16-1975, 40 y.o.   MRN: [id number]  HPI: Edward [name] is a 40 y.o.-year-old male, referred by his PCP, Dr. Milus Glazier, for management of DM2, dx in ~2013, non-insulin-dependent, uncontrolled, with complications (PN).  Last hemoglobin A1c was: Lab Results  Component Value Date   HGBA1C 10.9 04/02/2015   HGBA1C 8.7 02/25/2014   HGBA1C 9.5 02/14/2013   Pt is on a regimen of: - Metformin 1000 mg 2x a day, with meals - was not taking it before the last HbA1c. He started to take the Metformin >> 3 weeks ago. He missed 2 doses this week.  He continues to have occasional blurry vision, and also increased thirst and urination (2x nocturia) even after starting Metformin.  Pt checks his sugars 1x a day and they are: - am: previously 160s-400s before starting Metformin - no recent sugars - 2h after b'fast: n/c - before lunch: n/c - 2h after lunch: n/c - before dinner: n/c - 2h after dinner: n/c - bedtime: n/c - nighttime: n/c No lows. Lowest sugar was 150; ? hypoglycemia awareness. Highest sugar was 400s.  Glucometer: ?  Pt's meals are: - Breakfast: eggs bacon/sausage/biscuit - Lunch: sandwich/ burger + chips - Dinner: pizza >> steak - Snacks: 1 after dinner  - no CKD, last BUN/creatinine:  Lab Results  Component Value Date   BUN 8 04/02/2015   CREATININE 0.72 04/02/2015   - last set of lipids: Lab Results  Component Value Date   CHOL 171 04/02/2015   HDL 23* 04/02/2015   LDLCALC 81 04/02/2015   LDLDIRECT 70.2 04/12/2012   TRIG 336* 04/02/2015   CHOLHDL 7.4* 04/02/2015  On Gemfibrozil. - last eye exam was in . No DR.  - + numbness and tingling in his feet.  Pt has FH of DM in mother, father.  He also has a h/o HL  ROS: Constitutional: + weight loss, no fatigue, no subjective hyperthermia/hypothermia, + poor sleep, + see HPI Eyes: no blurry vision, no xerophthalmia ENT: no sore throat, no nodules palpated in throat,  no dysphagia/odynophagia, no hoarseness Cardiovascular: no CP/SOB/palpitations/leg swelling Respiratory: no cough/SOB Gastrointestinal: no N/V/D/C Musculoskeletal: no muscle/joint aches Skin: no rashes Neurological: no tremors/+ numbness/+ tingling/no dizziness Psychiatric: no depression/anxiety + pbs with erection  Past Medical History  Diagnosis Date  . Chicken pox   . Diabetes mellitus without complication (HCC)   . Increased frequency of headaches    Past Surgical History  Procedure Laterality Date  . Incise and drain abcess      left hand and jaw   Social History   Social History  . Marital Status: Married    Spouse Name: Edward [name]  . Number of Children: 0  . Years of Education: N/A   Occupational History  . carpenter    Social History Main Topics  . Smoking status: Current Every Day Smoker -- 1.00 packs/day for 29 years    Types: Cigarettes  . Smokeless tobacco: Never Used  . Alcohol Use: No  . Drug Use: No   Current Outpatient Prescriptions on File Prior to Visit  Medication Sig Dispense Refill  . gemfibrozil (LOPID) 600 MG tablet Take 1 tablet (600 mg total) by mouth 2 (two) times daily before a meal. 60 tablet 2  . metFORMIN (GLUCOPHAGE) 1000 MG tablet Take 1 tablet (1,000 mg total) by mouth 2 (two) times daily with a meal. (Patient taking differently: Take 500 mg by mouth 2 (two) times daily with  a meal. ) 180 tablet 3  . glucose blood (ACCU-CHEK AVIVA PLUS) test strip USE TO TEST ONCE DAILY AS DIRECTED 100 each 0   No current facility-administered medications on file prior to visit.   No Known Allergies Family History  Problem Relation Age of Onset  . Heart disease Mother   . Diabetes Mother   . Hyperlipidemia Father   . Stroke Father   . Hypertension Father   . Lung cancer Father   . Cancer Paternal Grandfather     prostate  Also: HTN, HL, Heart ds in uncle  PE: BP 118/70 mmHg  Pulse 86  Temp(Src) 98 F (36.7 C) (Oral)  Resp 12  Ht 5'  8" (1.727 m)  Wt 186 lb 9.6 oz (84.641 kg)  BMI 28.38 kg/m2  SpO2 97% Wt Readings from Last 3 Encounters:  04/29/15 186 lb 9.6 oz (84.641 kg)  04/04/15 188 lb 6.4 oz (85.458 kg)  04/02/15 192 lb 9.6 oz (87.363 kg)   Constitutional: overweight, in NAD Eyes: PERRLA, EOMI, no exophthalmos ENT: moist mucous membranes, no thyromegaly, no cervical lymphadenopathy Cardiovascular: RRR, No MRG Respiratory: CTA B Gastrointestinal: abdomen soft, NT, ND, BS+ Musculoskeletal: no deformities, strength intact in all 4 Skin: moist, warm, no rashes, + multiple tatoos Neurological: no tremor with outstretched hands, DTR normal in all 4  ASSESSMENT: 1. DM2, non-insulin-dependent, uncontrolled, without complications - previous med noncompliance  PLAN:  1. Patient with several years of uncontrolled diabetes, now on oral antidiabetic regimen with metformin, with unknown control as he did not check sugars after he started Metformin. A glucose level in the office was 219. Will try to avoid insulin >> continue Metformin and start Glipizide 5 mg 1x a day before meals. Advised not to skip meals. Will see pt back in 1 mo and decide whether we need to add insulin or now.  - We also discussed at length about his diet, which is poor, and I suggested changes. He accepts a referral to nutrition. - I suggested to:  Patient Instructions  Please continue Metformin 1000 mg 2x a day with meals. Start Glipizide 5 mg 2x a day before breakfast and dinner.  Please let me know if the sugars are consistently <80 or >200.  Please schedule an appt with Oran Rein with nutrition.  Please return in 1 month with your sugar log.   - Strongly advised him to start checking sugars at different times of the day - check 2 times a day, rotating checks - given sugar log and advised how to fill it and to bring it at next appt  - sent Rx for True Metrix meter and strips to his pharmacy - given foot care handout and explained the  principles  - given instructions for hypoglycemia management "15-15 rule"  - advised for yearly eye exams  - Return to clinic in 1 mo with sugar log

## 2015-04-29 NOTE — Patient Instructions (Addendum)
Please continue Metformin 1000 mg 2x a day with meals. Start Glipizide 5 mg 2x a day before breakfast and dinner.  Please let me know if the sugars are consistently <80 or >200.  Please schedule an appt with Oran Rein with nutrition.  Please return in 1 month with your sugar log.   PATIENT INSTRUCTIONS FOR TYPE 2 DIABETES:  **Please join MyChart!** - see attached instructions about how to join if you have not done so already.  DIET AND EXERCISE Diet and exercise is an important part of diabetic treatment.  We recommended aerobic exercise in the form of brisk walking (working between 40-60% of maximal aerobic capacity, similar to brisk walking) for 150 minutes per week (such as 30 minutes five days per week) along with 3 times per week performing 'resistance' training (using various gauge rubber tubes with handles) 5-10 exercises involving the major muscle groups (upper body, lower body and core) performing 10-15 repetitions (or near fatigue) each exercise. Start at half the above goal but build slowly to reach the above goals. If limited by weight, joint pain, or disability, we recommend daily walking in a swimming pool with water up to waist to reduce pressure from joints while allow for adequate exercise.    BLOOD GLUCOSES Monitoring your blood glucoses is important for continued management of your diabetes. Please check your blood glucoses 2-4 times a day: fasting, before meals and at bedtime (you can rotate these measurements - e.g. one day check before the 3 meals, the next day check before 2 of the meals and before bedtime, etc.).   HYPOGLYCEMIA (low blood sugar) Hypoglycemia is usually a reaction to not eating, exercising, or taking too much insulin/ other diabetes drugs.  Symptoms include tremors, sweating, hunger, confusion, headache, etc. Treat IMMEDIATELY with 15 grams of Carbs: . 4 glucose tablets .  cup regular juice/soda . 2 tablespoons raisins . 4 teaspoons sugar . 1  tablespoon honey Recheck blood glucose in 15 mins and repeat above if still symptomatic/blood glucose <100.  RECOMMENDATIONS TO REDUCE YOUR RISK OF DIABETIC COMPLICATIONS: * Take your prescribed MEDICATION(S) * Follow a DIABETIC diet: Complex carbs, fiber rich foods, (monounsaturated and polyunsaturated) fats * AVOID saturated/trans fats, high fat foods, >2,300 mg salt per day. * EXERCISE at least 5 times a week for 30 minutes or preferably daily.  * DO NOT SMOKE OR DRINK more than 1 drink a day. * Check your FEET every day. Do not wear tightfitting shoes. Contact us if you develop an ulcer * See your EYE doctor once a year or more if needed * Get a FLU shot once a year * Get a PNEUMONIA vaccine once before and once after age 36 years  GOALS:  * Your Hemoglobin A1c of <7%  * fasting sugars need to be <130 * after meals sugars need to be <180 (2h after you start eating) * Your Systolic BP should be 140 or lower  * Your Diastolic BP should be 80 or lower  * Your HDL (Good Cholesterol) should be 40 or higher  * Your LDL (Bad Cholesterol) should be 100 or lower. * Your Triglycerides should be 150 or lower  * Your Urine microalbumin (kidney function) should be <30 * Your Body Mass Index should be 25 or lower    Please consider the following ways to cut down carbs and fat and increase fiber and micronutrients in your diet: - substitute whole grain for white bread or pasta - substitute brown rice for white  rice - substitute 90-calorie flat bread pieces for slices of bread when possible - substitute sweet potatoes or yams for white potatoes - substitute humus for margarine - substitute tofu for cheese when possible - substitute almond or rice milk for regular milk (would not drink soy milk daily due to concern for soy estrogen influence on breast cancer risk) - substitute dark chocolate for other sweets when possible - substitute water - can add lemon or orange slices for taste - for diet  sodas (artificial sweeteners will trick your body that you can eat sweets without getting calories and will lead you to overeating and weight gain in the long run) - do not skip breakfast or other meals (this will slow down the metabolism and will result in more weight gain over time)  - can try smoothies made from fruit and almond/rice milk in am instead of regular breakfast - can also try old-fashioned (not instant) oatmeal made with almond/rice milk in am - order the dressing on the side when eating salad at a restaurant (pour less than half of the dressing on the salad) - eat as little meat as possible - can try juicing, but should not forget that juicing will get rid of the fiber, so would alternate with eating raw veg./fruits or drinking smoothies - use as little oil as possible, even when using olive oil - can dress a salad with a mix of balsamic vinegar and lemon juice, for e.g. - use agave nectar, stevia sugar, or regular sugar rather than artificial sweateners - steam or broil/roast veggies  - snack on veggies/fruit/nuts (unsalted, preferably) when possible, rather than processed foods - reduce or eliminate aspartame in diet (it is in diet sodas, chewing gum, etc) Read the labels!  Try to read Dr. Katherina Right book: "Program for Reversing Diabetes" for other ideas for healthy eating.

## 2015-05-06 ENCOUNTER — Emergency Department (HOSPITAL_COMMUNITY)
Admission: EM | Admit: 2015-05-06 | Discharge: 2015-05-06 | Disposition: A | Discharge status: Home / Self Care | Payer: 59 | Attending: Emergency Medicine | Admitting: Emergency Medicine

## 2015-05-06 ENCOUNTER — Encounter (HOSPITAL_COMMUNITY): Payer: Self-pay | Admitting: Emergency Medicine

## 2015-05-06 ENCOUNTER — Other Ambulatory Visit (HOSPITAL_COMMUNITY): Payer: Self-pay | Admitting: Orthopedic Surgery

## 2015-05-06 DIAGNOSIS — M542 Cervicalgia: Secondary | ICD-10-CM | POA: Diagnosis not present

## 2015-05-06 DIAGNOSIS — Z8619 Personal history of other infectious and parasitic diseases: Secondary | ICD-10-CM | POA: Diagnosis not present

## 2015-05-06 DIAGNOSIS — Z7984 Long term (current) use of oral hypoglycemic drugs: Secondary | ICD-10-CM | POA: Diagnosis not present

## 2015-05-06 DIAGNOSIS — Z79899 Other long term (current) drug therapy: Secondary | ICD-10-CM | POA: Diagnosis not present

## 2015-05-06 DIAGNOSIS — F17210 Nicotine dependence, cigarettes, uncomplicated: Secondary | ICD-10-CM | POA: Insufficient documentation

## 2015-05-06 DIAGNOSIS — Y998 Other external cause status: Secondary | ICD-10-CM | POA: Insufficient documentation

## 2015-05-06 DIAGNOSIS — Z23 Encounter for immunization: Secondary | ICD-10-CM | POA: Insufficient documentation

## 2015-05-06 DIAGNOSIS — S61011A Laceration without foreign body of right thumb without damage to nail, initial encounter: Secondary | ICD-10-CM | POA: Insufficient documentation

## 2015-05-06 DIAGNOSIS — Y9389 Activity, other specified: Secondary | ICD-10-CM | POA: Diagnosis not present

## 2015-05-06 DIAGNOSIS — IMO0002 Reserved for concepts with insufficient information to code with codable children: Secondary | ICD-10-CM

## 2015-05-06 DIAGNOSIS — Y9289 Other specified places as the place of occurrence of the external cause: Secondary | ICD-10-CM | POA: Diagnosis not present

## 2015-05-06 DIAGNOSIS — E119 Type 2 diabetes mellitus without complications: Secondary | ICD-10-CM | POA: Insufficient documentation

## 2015-05-06 DIAGNOSIS — W268XXA Contact with other sharp object(s), not elsewhere classified, initial encounter: Secondary | ICD-10-CM | POA: Diagnosis not present

## 2015-05-06 MED ORDER — HYDROCODONE-ACETAMINOPHEN 5-325 MG PO TABS: 2.0000 | ORAL_TABLET | ORAL | Status: DC | PRN

## 2015-05-06 MED ORDER — TETANUS-DIPHTH-ACELL PERTUSSIS 5-2.5-18.5 LF-MCG/0.5 IM SUSP
0.5000 mL | Freq: Once | INTRAMUSCULAR | Status: AC
  Administered 2015-05-06: 0.5 mL via INTRAMUSCULAR
  Filled 2015-05-06: qty 0.5

## 2015-05-06 MED ORDER — SULFAMETHOXAZOLE-TRIMETHOPRIM 800-160 MG PO TABS: 1.0000 | ORAL_TABLET | Freq: Two times a day (BID) | ORAL | Status: AC

## 2015-05-06 NOTE — ED Provider Notes (Signed)
CSN: [id number]     Arrival date & time 05/06/15  1854 History  By signing my name below, I, Altamease Oiler, attest that this documentation has been prepared under the direction and in the presence of Ariyel Jeangilles. Electronically Signed: Altamease Oiler, ED Scribe. 05/06/2015 8:10 PM   Chief Complaint  Patient presents with  . Laceration   The history is provided by the patient. No language interpreter was used.   Edward [name] is a 40 y.o. male with history of DM who presents to the Emergency Department complaining of a laceration at the right thumb with onset 1.5 hours ago. Pt states that he was using a bush ax when the blade came off and struck his thumb. Bleeding is controlled.  Last tetanus shot was possibly 6 years ago but he is not certain. Pt denies loss of strength or sensation in the thumb. No other injury.   Past Medical History  Diagnosis Date  . Chicken pox   . Diabetes mellitus without complication ([place])   . Increased frequency of headaches    Past Surgical History  Procedure Laterality Date  . Incise and drain abcess      left hand and jaw   Family History  Problem Relation Age of Onset  . Heart disease Mother   . Diabetes Mother   . Hyperlipidemia Father   . Stroke Father   . Hypertension Father   . Lung cancer Father   . Cancer Paternal Grandfather     prostate   Social History  Substance Use Topics  . Smoking status: Current Every Day Smoker -- 1.00 packs/day for 29 years    Types: Cigarettes  . Smokeless tobacco: Never Used  . Alcohol Use: No    Review of Systems  Skin: Positive for wound (right thumb).  All other systems reviewed and are negative.  Allergies  Review of patient's allergies indicates no known allergies.  Home Medications   Prior to Admission medications   Medication Sig Start Date End Date Taking? Authorizing Provider  Blood Glucose Monitoring Suppl (TRUE METRIX AIR GLUCOSE METER) w/Device KIT 1 each by Does not apply  route daily. 04/29/15   Philemon Kingdom, MD  gemfibrozil (LOPID) 600 MG tablet Take 1 tablet (600 mg total) by mouth 2 (two) times daily before a meal. 02/26/14   Jaynee Eagles, PA-C  glipiZIDE (GLUCOTROL) 5 MG tablet Take 1 tablet (5 mg total) by mouth 2 (two) times daily before a meal. 04/29/15   Philemon Kingdom, MD  glucose blood (TRUE METRIX BLOOD GLUCOSE TEST) test strip Use as instructed 2x a day 04/29/15   Philemon Kingdom, MD  metFORMIN (GLUCOPHAGE) 1000 MG tablet Take 1 tablet (1,000 mg total) by mouth 2 (two) times daily with a meal. Patient taking differently: Take 500 mg by mouth 2 (two) times daily with a meal.  02/25/14   Jaynee Eagles, PA-C  TRUEPLUS LANCETS 33G MISC Use 2x a day - with TrueMetrix meter 04/29/15   Philemon Kingdom, MD   BP 118/91 mmHg  Pulse 97  Temp(Src) 98.1 F (36.7 C) (Oral)  Resp 18  SpO2 100% Physical Exam  Constitutional: He is oriented to person, place, and time. He appears well-developed and well-nourished. No distress.  HENT:  Head: Normocephalic and atraumatic.  Eyes: Conjunctivae are normal. Right eye exhibits no discharge. Left eye exhibits no discharge. No scleral icterus.  Cardiovascular: Normal rate.   Pulmonary/Chest: Effort normal.  Musculoskeletal:       Right hand: He exhibits  laceration. He exhibits normal range of motion, no tenderness, no bony tenderness, normal two-point discrimination, normal capillary refill and no swelling. Normal sensation noted. Normal strength noted.       Hands: Neurological: He is alert and oriented to person, place, and time. Coordination normal.  Skin: Skin is warm and dry. No rash noted. He is not diaphoretic. No erythema. No pallor.  1cm laceration to extensor surface of R thumb. No evidence of tendon injury.  Psychiatric: He has a normal mood and affect. His behavior is normal.  Nursing note and vitals reviewed.   ED Course  Procedures (including critical care time)  LACERATION REPAIR Performed  XI:[id number] Samiah Ricklefs PA-C Consent: Verbal consent obtained. Risks and benefits: risks, benefits and alternatives were discussed Patient identity confirmed: provided demographic data Time out performed prior to procedure Prepped and Draped in normal sterile fashion Wound explored Laceration Location: right thumb Laceration Length: 1 cm No Foreign Bodies seen or palpated Anesthesia: local infiltration Irrigation method: syringe Amount of cleaning: standard Skin closure: Dermabond Patient tolerance: Patient tolerated the procedure well with no immediate complications.  DIAGNOSTIC STUDIES: Oxygen Saturation is 100% on RA,  normal by my interpretation.    COORDINATION OF CARE: 7:46 PM Discussed treatment plan which includes laceration repair and Tdap with pt at bedside and pt agreed to plan.  Labs Review Labs Reviewed - No data to display  Imaging Review No results found.    EKG Interpretation None      MDM   Final diagnoses:  Laceration    Tdap booster given.Pressure irrigation performed. Laceration occurred < 8 hours prior to repair which was well tolerated. Pt has DM so abx will be given. Laceration repaired with dermabond. Pt to f-u for wound check and suture removal in 7 days. Pt is hemodynamically stable w no complaints prior to dc.     I personally performed the services described in this documentation, which was scribed in my presence. The recorded information has been reviewed and is accurate.     Dondra Spry Anselmo, PA-C 05/06/15 2038  Orlie Dakin, MD 05/07/15 [phone]

## 2015-05-06 NOTE — ED Notes (Signed)
Pt was cutting wood, blade broke off and hit R thumb, bleeding controlled. Laceration to R thumb.

## 2015-05-06 NOTE — Discharge Instructions (Signed)
Take antibiotics as prescribed. Follow up with PCP if symptoms worsen. Return to the ED if you experience severe worsening of your symptoms, redness or swelling around wound, fever, chills, numbness/tingling in your extremity.

## 2015-05-06 NOTE — ED Notes (Signed)
See PA note for secondary assessment.   

## 2015-05-07 ENCOUNTER — Ambulatory Visit (HOSPITAL_COMMUNITY)
Admission: RE | Admit: 2015-05-07 | Discharge: 2015-05-07 | Disposition: A | Discharge status: Home / Self Care | Payer: 59 | Source: Ambulatory Visit | Attending: Orthopedic Surgery | Admitting: Orthopedic Surgery

## 2015-05-07 DIAGNOSIS — M4722 Other spondylosis with radiculopathy, cervical region: Secondary | ICD-10-CM | POA: Insufficient documentation

## 2015-05-07 DIAGNOSIS — M50222 Other cervical disc displacement at C5-C6 level: Secondary | ICD-10-CM | POA: Diagnosis not present

## 2015-05-07 DIAGNOSIS — M542 Cervicalgia: Secondary | ICD-10-CM | POA: Insufficient documentation

## 2015-05-07 DIAGNOSIS — M50221 Other cervical disc displacement at C4-C5 level: Secondary | ICD-10-CM | POA: Diagnosis not present

## 2015-05-08 ENCOUNTER — Ambulatory Visit (HOSPITAL_COMMUNITY): Payer: 59 | Admitting: Anesthesiology

## 2015-05-08 ENCOUNTER — Encounter (HOSPITAL_COMMUNITY)
Admission: AD | Disposition: A | Discharge status: Home / Self Care | Payer: Self-pay | Source: Ambulatory Visit | Attending: Orthopedic Surgery

## 2015-05-08 ENCOUNTER — Ambulatory Visit (INDEPENDENT_AMBULATORY_CARE_PROVIDER_SITE_OTHER): Payer: 59 | Admitting: Family Medicine

## 2015-05-08 ENCOUNTER — Ambulatory Visit (HOSPITAL_COMMUNITY)
Admission: AD | Admit: 2015-05-08 | Discharge: 2015-05-08 | Disposition: A | Discharge status: Home / Self Care | Payer: 59 | Source: Ambulatory Visit | Attending: Orthopedic Surgery | Admitting: Orthopedic Surgery

## 2015-05-08 ENCOUNTER — Ambulatory Visit (INDEPENDENT_AMBULATORY_CARE_PROVIDER_SITE_OTHER): Payer: 59

## 2015-05-08 ENCOUNTER — Encounter (HOSPITAL_COMMUNITY): Payer: Self-pay | Admitting: *Deleted

## 2015-05-08 VITALS — BP 124/88 | HR 98 | Temp 97.9°F | Resp 18 | Ht 68.0 in | Wt 191.2 lb

## 2015-05-08 DIAGNOSIS — F17210 Nicotine dependence, cigarettes, uncomplicated: Secondary | ICD-10-CM | POA: Diagnosis not present

## 2015-05-08 DIAGNOSIS — L03119 Cellulitis of unspecified part of limb: Secondary | ICD-10-CM

## 2015-05-08 DIAGNOSIS — S61401A Unspecified open wound of right hand, initial encounter: Secondary | ICD-10-CM

## 2015-05-08 DIAGNOSIS — E1140 Type 2 diabetes mellitus with diabetic neuropathy, unspecified: Secondary | ICD-10-CM

## 2015-05-08 DIAGNOSIS — L089 Local infection of the skin and subcutaneous tissue, unspecified: Secondary | ICD-10-CM | POA: Diagnosis not present

## 2015-05-08 DIAGNOSIS — L02511 Cutaneous abscess of right hand: Secondary | ICD-10-CM | POA: Insufficient documentation

## 2015-05-08 DIAGNOSIS — E119 Type 2 diabetes mellitus without complications: Secondary | ICD-10-CM | POA: Diagnosis not present

## 2015-05-08 DIAGNOSIS — Z7984 Long term (current) use of oral hypoglycemic drugs: Secondary | ICD-10-CM | POA: Insufficient documentation

## 2015-05-08 DIAGNOSIS — IMO0002 Reserved for concepts with insufficient information to code with codable children: Secondary | ICD-10-CM

## 2015-05-08 DIAGNOSIS — E1165 Type 2 diabetes mellitus with hyperglycemia: Secondary | ICD-10-CM

## 2015-05-08 DIAGNOSIS — S61011A Laceration without foreign body of right thumb without damage to nail, initial encounter: Secondary | ICD-10-CM | POA: Diagnosis not present

## 2015-05-08 HISTORY — PX: I & D EXTREMITY: SHX5045

## 2015-05-08 LAB — BASIC METABOLIC PANEL WITH GFR
Anion gap: 10 (ref 5–15)
BUN: 8 mg/dL (ref 6–20)
CO2: 19 mmol/L — ABNORMAL LOW (ref 22–32)
Calcium: 8.7 mg/dL — ABNORMAL LOW (ref 8.9–10.3)
Chloride: 110 mmol/L (ref 101–111)
Creatinine, Ser: 0.78 mg/dL (ref 0.61–1.24)
GFR calc Af Amer: >60 mL/min
GFR calc non Af Amer: >60 mL/min
Glucose, Bld: 155 mg/dL — ABNORMAL HIGH (ref 65–99)
Potassium: 4.1 mmol/L (ref 3.5–5.1)
Sodium: 139 mmol/L (ref 135–145)

## 2015-05-08 LAB — CBC
HCT: 44 % (ref 39.0–52.0)
Hemoglobin: 15.6 g/dL (ref 13.0–17.0)
MCH: 33.1 pg (ref 26.0–34.0)
MCHC: 35.5 g/dL (ref 30.0–36.0)
MCV: 93.2 fL (ref 78.0–100.0)
Platelets: 217 10*3/uL (ref 150–400)
RBC: 4.72 MIL/uL (ref 4.22–5.81)
RDW: 12.4 % (ref 11.5–15.5)
WBC: 8.8 10*3/uL (ref 4.0–10.5)

## 2015-05-08 LAB — GLUCOSE, CAPILLARY
Glucose-Capillary: 158 mg/dL — ABNORMAL HIGH (ref 65–99)
Glucose-Capillary: 130 mg/dL — ABNORMAL HIGH (ref 65–99)
Glucose-Capillary: 117 mg/dL — ABNORMAL HIGH (ref 65–99)

## 2015-05-08 SURGERY — IRRIGATION AND DEBRIDEMENT EXTREMITY
Anesthesia: General | Site: Arm Lower | Laterality: Right

## 2015-05-08 MED ORDER — FENTANYL CITRATE (PF) 100 MCG/2ML IJ SOLN
INTRAMUSCULAR | Status: DC | PRN
  Administered 2015-05-08: 50 ug via INTRAVENOUS
  Administered 2015-05-08: 100 ug via INTRAVENOUS

## 2015-05-08 MED ORDER — OXYCODONE-ACETAMINOPHEN 5-325 MG PO TABS
ORAL_TABLET | ORAL | Status: AC
  Filled 2015-05-08: qty 2

## 2015-05-08 MED ORDER — ONDANSETRON HCL 4 MG/2ML IJ SOLN
INTRAMUSCULAR | Status: AC
  Filled 2015-05-08: qty 2

## 2015-05-08 MED ORDER — ONDANSETRON HCL 4 MG/2ML IJ SOLN
INTRAMUSCULAR | Status: DC | PRN
  Administered 2015-05-08: 4 mg via INTRAVENOUS

## 2015-05-08 MED ORDER — FENTANYL CITRATE (PF) 250 MCG/5ML IJ SOLN
INTRAMUSCULAR | Status: AC
  Filled 2015-05-08: qty 5

## 2015-05-08 MED ORDER — PROPOFOL 10 MG/ML IV BOLUS
INTRAVENOUS | Status: DC | PRN
  Administered 2015-05-08: 200 mg via INTRAVENOUS

## 2015-05-08 MED ORDER — SODIUM CHLORIDE 0.9 % IR SOLN
Status: DC | PRN
  Administered 2015-05-08: 1000 mL

## 2015-05-08 MED ORDER — PROPOFOL 10 MG/ML IV BOLUS
INTRAVENOUS | Status: AC
  Filled 2015-05-08: qty 20

## 2015-05-08 MED ORDER — CEFAZOLIN SODIUM-DEXTROSE 2-3 GM-% IV SOLR: 2.0000 g | INTRAVENOUS | Status: DC

## 2015-05-08 MED ORDER — CHLORHEXIDINE GLUCONATE 4 % EX LIQD: 60.0000 mL | Freq: Once | CUTANEOUS | Status: DC

## 2015-05-08 MED ORDER — LACTATED RINGERS IV SOLN
INTRAVENOUS | Status: DC
  Administered 2015-05-08: 16:00:00 via INTRAVENOUS

## 2015-05-08 MED ORDER — OXYCODONE-ACETAMINOPHEN 5-325 MG PO TABS
2.0000 | ORAL_TABLET | Freq: Once | ORAL | Status: AC
  Administered 2015-05-08: 2 via ORAL

## 2015-05-08 MED ORDER — HYDROMORPHONE HCL 1 MG/ML IJ SOLN
INTRAMUSCULAR | Status: AC
  Filled 2015-05-08: qty 2

## 2015-05-08 MED ORDER — MIDAZOLAM HCL 2 MG/2ML IJ SOLN
INTRAMUSCULAR | Status: AC
  Filled 2015-05-08: qty 2

## 2015-05-08 MED ORDER — CEFAZOLIN SODIUM-DEXTROSE 2-3 GM-% IV SOLR
INTRAVENOUS | Status: AC
  Administered 2015-05-08: 2 g via INTRAVENOUS
  Filled 2015-05-08: qty 50

## 2015-05-08 MED ORDER — MIDAZOLAM HCL 5 MG/5ML IJ SOLN
INTRAMUSCULAR | Status: DC | PRN
  Administered 2015-05-08: 2 mg via INTRAVENOUS

## 2015-05-08 MED ORDER — LIDOCAINE HCL (CARDIAC) 20 MG/ML IV SOLN
INTRAVENOUS | Status: AC
  Filled 2015-05-08: qty 5

## 2015-05-08 MED ORDER — HYDROMORPHONE HCL 1 MG/ML IJ SOLN
0.2500 mg | INTRAMUSCULAR | Status: DC | PRN
  Administered 2015-05-08 (×2): 0.5 mg via INTRAVENOUS

## 2015-05-08 MED ORDER — BUPIVACAINE HCL (PF) 0.25 % IJ SOLN
INTRAMUSCULAR | Status: AC
  Filled 2015-05-08: qty 30

## 2015-05-08 MED ORDER — LACTATED RINGERS IV SOLN
INTRAVENOUS | Status: DC | PRN
  Administered 2015-05-08: 20:00:00 via INTRAVENOUS

## 2015-05-08 MED FILL — OXYCODONE/APAP 5/325MG: 5-325 | 3 days supply | Qty: 40 | Fill #0

## 2015-05-08 SURGICAL SUPPLY — 58 items
BANDAGE ELASTIC 3 VELCRO ST LF (GAUZE/BANDAGES/DRESSINGS) ×3 IMPLANT
BANDAGE ELASTIC 4 VELCRO ST LF (GAUZE/BANDAGES/DRESSINGS) ×3 IMPLANT
BNDG COHESIVE 1X5 TAN STRL LF (GAUZE/BANDAGES/DRESSINGS) IMPLANT
BNDG CONFORM 2 STRL LF (GAUZE/BANDAGES/DRESSINGS) ×3 IMPLANT
BNDG ELASTIC 2X5.8 VLCR STR LF (GAUZE/BANDAGES/DRESSINGS) ×3 IMPLANT
BNDG ESMARK 4X9 LF (GAUZE/BANDAGES/DRESSINGS) ×3 IMPLANT
BNDG GAUZE ELAST 4 BULKY (GAUZE/BANDAGES/DRESSINGS) ×3 IMPLANT
CORDS BIPOLAR (ELECTRODE) ×3 IMPLANT
COVER SURGICAL LIGHT HANDLE (MISCELLANEOUS) ×3 IMPLANT
CUFF TOURNIQUET SINGLE 18IN (TOURNIQUET CUFF) ×3 IMPLANT
CUFF TOURNIQUET SINGLE 24IN (TOURNIQUET CUFF) IMPLANT
DRAIN PENROSE 1/4X12 LTX STRL (WOUND CARE) IMPLANT
DRAPE SURG 17X23 STRL (DRAPES) ×3 IMPLANT
DRSG ADAPTIC 3X8 NADH LF (GAUZE/BANDAGES/DRESSINGS) ×3 IMPLANT
ELECT REM PT RETURN 9FT ADLT (ELECTROSURGICAL)
ELECTRODE REM PT RTRN 9FT ADLT (ELECTROSURGICAL) IMPLANT
GAUZE SPONGE 4X4 12PLY STRL (GAUZE/BANDAGES/DRESSINGS) ×3 IMPLANT
GAUZE XEROFORM 1X8 LF (GAUZE/BANDAGES/DRESSINGS) ×3 IMPLANT
GAUZE XEROFORM 5X9 LF (GAUZE/BANDAGES/DRESSINGS) IMPLANT
GLOVE BIOGEL PI IND STRL 8.5 (GLOVE) ×1 IMPLANT
GLOVE BIOGEL PI INDICATOR 8.5 (GLOVE) ×2
GLOVE SURG ORTHO 8.0 STRL STRW (GLOVE) ×3 IMPLANT
GOWN STRL REUS W/ TWL LRG LVL3 (GOWN DISPOSABLE) ×3 IMPLANT
GOWN STRL REUS W/ TWL XL LVL3 (GOWN DISPOSABLE) ×1 IMPLANT
GOWN STRL REUS W/TWL LRG LVL3 (GOWN DISPOSABLE) ×6
GOWN STRL REUS W/TWL XL LVL3 (GOWN DISPOSABLE) ×2
HANDPIECE INTERPULSE COAX TIP (DISPOSABLE)
KIT BASIN OR (CUSTOM PROCEDURE TRAY) ×3 IMPLANT
KIT ROOM TURNOVER OR (KITS) ×3 IMPLANT
MANIFOLD NEPTUNE II (INSTRUMENTS) ×3 IMPLANT
NEEDLE HYPO 25GX1X1/2 BEV (NEEDLE) IMPLANT
NS IRRIG 1000ML POUR BTL (IV SOLUTION) ×3 IMPLANT
PACK ORTHO EXTREMITY (CUSTOM PROCEDURE TRAY) ×3 IMPLANT
PAD ARMBOARD 7.5X6 YLW CONV (MISCELLANEOUS) ×6 IMPLANT
PAD CAST 4YDX4 CTTN HI CHSV (CAST SUPPLIES) ×1 IMPLANT
PADDING CAST COTTON 4X4 STRL (CAST SUPPLIES) ×2
PADDING UNDERCAST 2 STRL (CAST SUPPLIES) ×2
PADDING UNDERCAST 2X4 STRL (CAST SUPPLIES) ×1 IMPLANT
SET HNDPC FAN SPRY TIP SCT (DISPOSABLE) IMPLANT
SOAP 2 % CHG 4 OZ (WOUND CARE) ×3 IMPLANT
SPLINT FIBERGLASS 3X12 (CAST SUPPLIES) ×3 IMPLANT
SPONGE LAP 18X18 X RAY DECT (DISPOSABLE) ×3 IMPLANT
SPONGE LAP 4X18 X RAY DECT (DISPOSABLE) ×3 IMPLANT
SUCTION FRAZIER HANDLE 10FR (MISCELLANEOUS) ×2
SUCTION TUBE FRAZIER 10FR DISP (MISCELLANEOUS) ×1 IMPLANT
SUT ETHILON 4 0 PS 2 18 (SUTURE) IMPLANT
SUT ETHILON 5 0 P 3 18 (SUTURE)
SUT NYLON ETHILON 5-0 P-3 1X18 (SUTURE) IMPLANT
SUT PROLENE 3 0 PS 2 (SUTURE) ×3 IMPLANT
SYR CONTROL 10ML LL (SYRINGE) IMPLANT
TOWEL OR 17X24 6PK STRL BLUE (TOWEL DISPOSABLE) ×3 IMPLANT
TOWEL OR 17X26 10 PK STRL BLUE (TOWEL DISPOSABLE) ×3 IMPLANT
TUBE ANAEROBIC SPECIMEN COL (MISCELLANEOUS) IMPLANT
TUBE CONNECTING 12'X1/4 (SUCTIONS) ×1
TUBE CONNECTING 12X1/4 (SUCTIONS) ×2 IMPLANT
UNDERPAD 30X30 INCONTINENT (UNDERPADS AND DIAPERS) ×3 IMPLANT
WATER STERILE IRR 1000ML POUR (IV SOLUTION) ×3 IMPLANT
YANKAUER SUCT BULB TIP NO VENT (SUCTIONS) ×3 IMPLANT

## 2015-05-08 NOTE — Anesthesia Preprocedure Evaluation (Signed)
Anesthesia Evaluation  Patient identified by MRN, date of birth, ID band Patient awake    Reviewed: Allergy & Precautions, H&P , NPO status , Patient's Chart, lab work & pertinent test results  Airway Mallampati: II  TM Distance: >3 FB Neck ROM: Full    Dental no notable dental hx. (+) Teeth Intact, Dental Advisory Given   Pulmonary Current Smoker,    Pulmonary exam normal breath sounds clear to auscultation       Cardiovascular negative cardio ROS   Rhythm:Regular Rate:Normal     Neuro/Psych  Headaches, negative psych ROS   GI/Hepatic negative GI ROS, Neg liver ROS,   Endo/Other  diabetes, Type 2, Oral Hypoglycemic Agents  Renal/GU negative Renal ROS  negative genitourinary   Musculoskeletal   Abdominal   Peds  Hematology negative hematology ROS (+)   Anesthesia Other Findings   Reproductive/Obstetrics negative OB ROS                             Anesthesia Physical Anesthesia Plan  ASA: II  Anesthesia Plan: General   Post-op Pain Management:    Induction: Intravenous  Airway Management Planned: LMA  Additional Equipment:   Intra-op Plan:   Post-operative Plan: Extubation in OR  Informed Consent: I have reviewed the patients History and Physical, chart, labs and discussed the procedure including the risks, benefits and alternatives for the proposed anesthesia with the patient or authorized representative who has indicated his/her understanding and acceptance.   Dental advisory given  Plan Discussed with: CRNA  Anesthesia Plan Comments:         Anesthesia Quick Evaluation

## 2015-05-08 NOTE — Progress Notes (Signed)
Subjective:    Patient ID: Edward [name], male    DOB: [birth year], 40 y.o.   MRN: [id number] By signing my name below, I, Edward [name], attest that this documentation has been prepared under the direction and in the presence of Edward Ray, MD.  Electronically Signed: Zola [name], Medical Scribe. 05/08/2015. 8:23 AM.  HPI HPI Comments: Edward [name] is a 40 y.o. male who presents to the [facility] complaining of a hand injury. He was seen 2 days ago at [facility] ER. Laceration to his right thumb, 1.5 hours prior. Injury with a bush axe. Last tetanus approximately 6 years prior. Strength and sensation was intact. 1 cm laceration closed with Dermabond. Pressure irrigation was also done. Tdap given. Was started on antibiotics (Bactrim) with underlying diabetes.  Patient states he has difficulty moving his right thumb. He has had increased pain to the thumb and also began noticing significant redness to the thumb yesterday as well as discharge. He is concerned there may be a fracture in his thumb. He did not have XR imaging done when he was in the ED.  He has a history of uncontrolled diabetes. Last A1c 10.9 on February 1st. He is followed by Dr. Cruzita Lederer. See note 2/28. Started on glipizide 5 mg twice a day. Continued his metformin.  Patient Active Problem List   Diagnosis Date Noted  . Chronic pain of right lower extremity 11/16/2013  . Type II or unspecified type diabetes mellitus with neurological manifestations, not stated as uncontrolled 04/12/2012  . Tinea pedis 04/12/2012  . Migraine, unspecified, without mention of intractable migraine without mention of status migrainosus 04/12/2012  . Soft tissue mass 04/12/2012  . Tobacco use disorder 04/12/2012   Past Medical History  Diagnosis Date  . Chicken pox   . Diabetes mellitus without complication ([place])   . Increased frequency of headaches    Past Surgical History  Procedure Laterality Date  . Incise  and drain abcess      left hand and jaw   No Known Allergies Prior to Admission medications   Medication Sig Start Date End Date Taking? Authorizing Provider  Blood Glucose Monitoring Suppl (TRUE METRIX AIR GLUCOSE METER) w/Device KIT 1 each by Does not apply route daily. 04/29/15  Yes Philemon Kingdom, MD  gemfibrozil (LOPID) 600 MG tablet Take 1 tablet (600 mg total) by mouth 2 (two) times daily before a meal. 02/26/14  Yes Jaynee Eagles, PA-C  glipiZIDE (GLUCOTROL) 5 MG tablet Take 1 tablet (5 mg total) by mouth 2 (two) times daily before a meal. 04/29/15  Yes Philemon Kingdom, MD  glucose blood (TRUE METRIX BLOOD GLUCOSE TEST) test strip Use as instructed 2x a day 04/29/15  Yes Philemon Kingdom, MD  metFORMIN (GLUCOPHAGE) 1000 MG tablet Take 1 tablet (1,000 mg total) by mouth 2 (two) times daily with a meal. Patient taking differently: Take 500 mg by mouth 2 (two) times daily with a meal.  02/25/14  Yes Jaynee Eagles, PA-C  sulfamethoxazole-trimethoprim (BACTRIM DS,SEPTRA DS) 800-160 MG tablet Take 1 tablet by mouth 2 (two) times daily. 05/06/15 05/13/15 Yes Samantha Tripp Dowless, PA-C  TRUEPLUS LANCETS 33G MISC Use 2x a day - with TrueMetrix meter 04/29/15  Yes Philemon Kingdom, MD  HYDROcodone-acetaminophen (NORCO/VICODIN) 5-325 MG tablet Take 2 tablets by mouth every 4 (four) hours as needed. Patient not taking: Reported on 05/08/2015 05/06/15   Carlos Levering, PA-C   Social History   Social History  . Marital Status:  Married    Spouse Name: Edward [name]  . Number of Children: 0  . Years of Education: N/A   Occupational History  . carpenter    Social History Main Topics  . Smoking status: Current Every Day Smoker -- 1.00 packs/day for 29 years    Types: Cigarettes  . Smokeless tobacco: Never Used  . Alcohol Use: No  . Drug Use: No  . Sexual Activity: Not on file   Other Topics Concern  . Not on file   Social History Narrative     Review of Systems  Musculoskeletal: Positive  for arthralgias.  Skin: Positive for color change and wound.       Objective:   Physical Exam  Constitutional: He is oriented to person, place, and time. He appears well-developed and well-nourished. No distress.  HENT:  Head: Normocephalic and atraumatic.  Mouth/Throat: Oropharynx is clear and moist. No oropharyngeal exudate.  Eyes: Pupils are equal, round, and reactive to light.  Neck: Neck supple.  Cardiovascular: Normal rate.   Pulmonary/Chest: Effort normal.  Musculoskeletal: He exhibits tenderness.  Some guarded movement of the right thumb. He is able to resist flexion and extension of the thumb, but with discomfort. Tender along base of proximal phalanx of thumb, volar aspect lateral to the wound, and along the 1st metacarpal.  Neurological: He is alert and oriented to person, place, and time. No cranial nerve deficit.  Skin: Skin is warm and dry. No rash noted.  Approximately 2 cm laceration with dermabond overlying. Some dried discharge at the mid aspect. There is some warmth and erythema. Soft tissue swelling from the base of the thumb to the dorsal radial hand. Erythema measures approximately 7 cm x 6 cm.  Psychiatric: He has a normal mood and affect. His behavior is normal.  Nursing note and vitals reviewed.   Filed Vitals:   05/08/15 0820  BP: 124/88  Pulse: 98  Temp: 97.9 F (36.6 C)  TempSrc: Oral  Resp: 18  Height: _0  (1.727 m)  Weight: 191 lb 3.2 oz (86.728 kg)  SpO2: 97%        Assessment & Plan:   Edward [name] is a 40 y.o. male Open wound of hand with complication, right, initial encounter - Plan: DG Finger Thumb Right  Cellulitis of hand  Uncontrolled type 2 diabetes mellitus with diabetic neuropathy, without long-term current use of insulin (HCC)  Wound of right hand, right hand dominant. Previous history of uncontrolled diabetes, but states this is been improved recently with addition of glipizide and diet control. Has taken 3 doses of  antibiotics, but appears to have a start of cellulitis around the wound. Increased swelling, redness, and difficulty with range of motion of the thumb. NVI distally. X-[name] overall without apparent fracture, 1 small area of possible chip on the bone of the proximal thumb phalanx. Can be reviewed by surgeon.   -Discuss with hand surgeon, will have him worked into their schedule today to determine if I&D is necessary. Continue antibiotics for now, nothing by mouth until evaluated by surgeon.  No orders of the defined types were placed in this encounter.   Patient Instructions  Unfortunately it does look like you have an infection around the wound at this time. I did discuss this with Dr. Apolonio Schneiders at [facility]. They will see you today, but you will be worked into the schedule. Do not eat anything at this time in case there is a need to operate or open up that  wound.  Go to [facility] orthopedics after leaving our office. Bring a copy of x-rays from here. Overall they looked okay, there may be a questionable area of chip on the thumb but hand surgeon can also look at these x-rays.  [address] [place], [place] [address] Phone [phone]     I personally performed the services described in this documentation, which was scribed in my presence. The recorded information has been reviewed and considered, and addended by me as needed.

## 2015-05-08 NOTE — Patient Instructions (Addendum)
Unfortunately it does look like you have an infection around the wound at this time. I did discuss this with Dr. Orlan [facility]. They will see you today, but you will be worked into the schedule. Do not eat anything at this time in case there is a need to operate or open up that wound.  [facility] after leaving our office. Bring a copy of x-rays from here. Overall they looked okay, there may be a questionable area of chip on the thumb but hand surgeon can also look at these x-rays.  [address] [place], [place] [address] Phone [phone]

## 2015-05-08 NOTE — Transfer of Care (Signed)
Immediate Anesthesia Transfer of Care Note  Patient: Edward [name]  Procedure(s) Performed: Procedure(s): IRRIGATION AND DEBRIDEMENT THUMB (Right)  Patient Location: PACU  Anesthesia Type:General  Level of Consciousness: awake, alert  and oriented  Airway & Oxygen Therapy: Patient Spontanous Breathing and Patient connected to nasal cannula oxygen  Post-op Assessment: Report given to RN and Post -op Vital signs reviewed and stable  Post vital signs: Reviewed and stable  Last Vitals:  Filed Vitals:   05/08/15 1557  BP: 111/60  Pulse: 81  Temp: 36.8 C    Complications: No apparent anesthesia complications

## 2015-05-08 NOTE — Anesthesia Postprocedure Evaluation (Signed)
Anesthesia Post Note  Patient: Edward [name]  Procedure(s) Performed: Procedure(s) (LRB): IRRIGATION AND DEBRIDEMENT THUMB (Right)  Patient location during evaluation: PACU Anesthesia Type: General Level of consciousness: awake and alert Pain management: pain level controlled Vital Signs Assessment: post-procedure vital signs reviewed and stable Respiratory status: spontaneous breathing, nonlabored ventilation and respiratory function stable Cardiovascular status: blood pressure returned to baseline and stable Postop Assessment: no signs of nausea or vomiting Anesthetic complications: no    Last Vitals:  Filed Vitals:   05/08/15 2123 05/08/15 2130  BP: 128/98   Pulse:  75  Temp:  36.4 C  Resp:  18    Last Pain:  Filed Vitals:   05/08/15 2146  PainSc: 4                  Mayank Teuscher,W. EDMOND

## 2015-05-08 NOTE — Brief Op Note (Signed)
05/08/2015  6:11 PM  PATIENT:  Edward [name]  40 y.o. male  PRE-OPERATIVE DIAGNOSIS:  right thumb infection  POST-OPERATIVE DIAGNOSIS:  * No post-op diagnosis entered *  PROCEDURE:  Procedure(s): IRRIGATION AND DEBRIDEMENT THUMB (Right)  SURGEON:  Surgeon(s) and Role:    * Bradly BienenstockFred Dickson Kostelnik, MD - Primary  PHYSICIAN ASSISTANT:   ASSISTANTS: none   ANESTHESIA:   general  EBL:     BLOOD ADMINISTERED:none  DRAINS: none   LOCAL MEDICATIONS USED:  NONE  SPECIMEN:  No Specimen  DISPOSITION OF SPECIMEN:  N/A  COUNTS:  YES  TOURNIQUET:    DICTATION: .Other Dictation: Dictation Number [id number]  PLAN OF CARE: Discharge to home after PACU  PATIENT DISPOSITION:  PACU - hemodynamically stable.   Delay start of Pharmacological VTE agent (>24hrs) due to surgical blood loss or risk of bleeding: not applicable

## 2015-05-08 NOTE — H&P (Signed)
Edward [name] is an 40 y.o. male.   Chief Complaint: RIGHT THUMB LACERATION WITH PAINFUL WOUND HPI: Edward [name] is a 40 y.o. male who presents to the [facility] complaining of a hand injury. He was seen 2 days ago at [facility] ER. Laceration to his right thumb, 1.5 hours prior. Injury with a bush axe. Last tetanus approximately 6 years prior. Strength and sensation was intact. 1 cm laceration closed with Dermabond. Pressure irrigation was also done. Tdap given. Was started on antibiotics (Bactrim) with underlying diabetes.  Patient states he has difficulty moving his right thumb. He has had increased pain to the thumb and also began noticing significant redness to the thumb yesterday as well as discharge. He is concerned there may be a fracture in his thumb. He did not have XR imaging done when he was in the ED.  He has a history of uncontrolled diabetes. Last A1c 10.9 on February 1st. He is followed by Dr. Cruzita Lederer. See note 2/28. Started on glipizide 5 mg twice a day. Continued his metformin.  Past Medical History  Diagnosis Date  . Chicken pox   . Diabetes mellitus without complication ([place])   . Increased frequency of headaches     Past Surgical History  Procedure Laterality Date  . Incise and drain abcess      left hand and jaw    Family History  Problem Relation Age of Onset  . Heart disease Mother   . Diabetes Mother   . Hyperlipidemia Father   . Stroke Father   . Hypertension Father   . Lung cancer Father   . Cancer Paternal Grandfather     prostate   Social History:  reports that he has been smoking Cigarettes.  He has a 29 pack-year smoking history. He has never used smokeless tobacco. He reports that he does not drink alcohol or use illicit drugs.  Allergies: No Known Allergies  Medications Prior to Admission  Medication Sig Dispense Refill  . gemfibrozil (LOPID) 600 MG tablet Take 1 tablet (600 mg total) by mouth 2 (two) times daily before  a meal. 60 tablet 2  . glipiZIDE (GLUCOTROL) 5 MG tablet Take 1 tablet (5 mg total) by mouth 2 (two) times daily before a meal. 60 tablet 3  . metFORMIN (GLUCOPHAGE) 1000 MG tablet Take 1 tablet (1,000 mg total) by mouth 2 (two) times daily with a meal. 180 tablet 3  . oxyCODONE-acetaminophen (PERCOCET/ROXICET) 5-325 MG tablet Take 2 tablets by mouth every 4 (four) hours as needed for moderate pain or severe pain.    Marland Kitchen sulfamethoxazole-trimethoprim (BACTRIM DS,SEPTRA DS) 800-160 MG tablet Take 1 tablet by mouth 2 (two) times daily. 14 tablet 0  . Blood Glucose Monitoring Suppl (TRUE METRIX AIR GLUCOSE METER) w/Device KIT 1 each by Does not apply route daily. (Patient not taking: Reported on 05/08/2015) 1 kit 1  . glucose blood (TRUE METRIX BLOOD GLUCOSE TEST) test strip Use as instructed 2x a day (Patient not taking: Reported on 05/08/2015) 100 each 12  . HYDROcodone-acetaminophen (NORCO/VICODIN) 5-325 MG tablet Take 2 tablets by mouth every 4 (four) hours as needed. (Patient not taking: Reported on 05/08/2015) 4 tablet 0  . TRUEPLUS LANCETS 33G MISC Use 2x a day - with TrueMetrix meter (Patient not taking: Reported on 05/08/2015) 100 each 12    Results for orders placed or performed during the hospital encounter of 05/08/15 (from the past 48 hour(s))  Glucose, capillary  Status: Abnormal   Collection Time: 05/08/15  4:02 PM  Result Value Ref Range   Glucose-Capillary 158 (H) 65 - 99 mg/dL  CBC     Status: None   Collection Time: 05/08/15  4:10 PM  Result Value Ref Range   WBC 8.8 4.0 - 10.5 K/uL   RBC 4.72 4.22 - 5.81 MIL/uL   Hemoglobin 15.6 13.0 - 17.0 g/dL   HCT 44.0 39.0 - 52.0 %   MCV 93.2 78.0 - 100.0 fL   MCH 33.1 26.0 - 34.0 pg   MCHC 35.5 30.0 - 36.0 g/dL   RDW 12.4 11.5 - 15.5 %   Platelets 217 150 - 400 K/uL  Basic metabolic panel     Status: Abnormal   Collection Time: 05/08/15  4:10 PM  Result Value Ref Range   Sodium 139 135 - 145 mmol/L   Potassium 4.1 3.5 - 5.1 mmol/L    Chloride 110 101 - 111 mmol/L   CO2 19 (L) 22 - 32 mmol/L   Glucose, Bld 155 (H) 65 - 99 mg/dL   BUN 8 6 - 20 mg/dL   Creatinine, Ser 0.78 0.61 - 1.24 mg/dL   Calcium 8.7 (L) 8.9 - 10.3 mg/dL   GFR calc non Af Amer >60 >60 mL/min   GFR calc Af Amer >60 >60 mL/min    Comment: (NOTE) The eGFR has been calculated using the CKD EPI equation. This calculation has not been validated in all clinical situations. eGFR's persistently <60 mL/min signify possible Chronic Kidney Disease.    Anion gap 10 5 - 15   Mr Cervical Spine Wo Contrast  05/07/2015  CLINICAL DATA:  Neck pain radiating to the right shoulder and arm for 2-3 months with weakness. EXAM: MRI CERVICAL SPINE WITHOUT CONTRAST TECHNIQUE: Multiplanar, multisequence MR imaging of the cervical spine was performed. No intravenous contrast was administered. COMPARISON:  None. FINDINGS: The study is mildly motion degraded. There is mild reversal of the normal cervical lordosis. There is no listhesis. Vertebral body height are preserved. Minimal disc space narrowing is noted at C5-6. Vertebral bone marrow signal is unremarkable. Craniocervical junction is unremarkable. The cervical spinal cord is normal in caliber and signal. Paraspinal soft tissues are unremarkable. C2-3:  Mild left facet arthrosis without stenosis. C3-4: Mild left facet arthrosis results in mild left neural foraminal stenosis. No spinal stenosis. C4-5: Minimal bilateral neural foraminal narrowing due to slight uncovertebral spurring. No spinal stenosis. C5-6: Minimal bilateral neural foraminal narrowing due to slight uncovertebral spurring and minimal disc bulging. No spinal stenosis. C6-7:  At most minimal disc bulging without stenosis. C7-T1:  Negative. IMPRESSION: Mild cervical spondylosis and facet arthrosis with at most mild neural foraminal narrowing as above. Electronically Signed   By: [name] M.D.   On: 05/07/2015 07:32   Dg Finger Thumb Right  05/08/2015  CLINICAL DATA:   Laceration. EXAM: RIGHT THUMB 2+V COMPARISON:  No prior. FINDINGS: Tiny bony density noted along the distal aspect of the right first metacarpal. This may represent a tiny fracture chip. This may be old. No other focal bony abnormality. No radiopaque foreign body . IMPRESSION: Tiny bony density noted along the distal aspect of the right first metacarpal. This may represent a tiny fracture chip. This may be old. No other focal bony abnormality. Electronically Signed   By: [place]   On: 05/08/2015 08:44    ROSNO RECENT ILLNESSES OR HOSPITALIZATIONS  Blood pressure 111/60, pulse 81, temperature 98.2 F (36.8 C), temperature source  Oral, height _0  (1.727 m), weight 86.637 kg (191 lb), SpO2 99 %. Physical Exam  General Appearance:  Alert, cooperative, no distress, appears stated age  Head:  Normocephalic, without obvious abnormality, atraumatic  Eyes:  Pupils equal, conjunctiva/corneas clear,         Throat: Lips, mucosa, and tongue normal; teeth and gums normal  Neck: No visible masses     Lungs:   respirations unlabored  Chest Wall:  No tenderness or deformity  Heart:  Regular rate and rhythm,  Abdomen:   Soft, non-tender,         Extremities: RIGHT HAND: DORSAL WOUND OVER THUMB MP REGION, ABLE TO EXTEND THUMB IP JOINT FINGERS WARM WELL PERFUSED MILD SWELLING AND REDNESS DORSALLY  Pulses: 2+ and symmetric  Skin: Skin color, texture, turgor normal, no rashes or lesions     Neurologic: Normal   Assessment/Plan RIGHT THUMB LACERATION WITH CONCERN FOR DEEP SPACE INFECTION  RIGHT THUMB INCISION AND DRAINAGE AND REPAIR AS INDICATED  R/B/A DISCUSSED WITH PT IN OFFICE.  PT VOICED UNDERSTANDING OF PLAN CONSENT SIGNED DAY OF SURGERY PT SEEN AND EXAMINED PRIOR TO OPERATIVE PROCEDURE/DAY OF SURGERY SITE MARKED. QUESTIONS ANSWERED WILL GO HOME FOLLOWING SURGERY  WE ARE PLANNING SURGERY FOR YOUR UPPER EXTREMITY. THE RISKS AND BENEFITS OF SURGERY INCLUDE BUT NOT LIMITED TO  BLEEDING INFECTION, DAMAGE TO NEARBY NERVES ARTERIES TENDONS, FAILURE OF SURGERY TO ACCOMPLISH ITS INTENDED GOALS, PERSISTENT SYMPTOMS AND NEED FOR FURTHER SURGICAL INTERVENTION. WITH THIS IN MIND WE WILL PROCEED. I HAVE DISCUSSED WITH THE PATIENT THE PRE AND POSTOPERATIVE REGIMEN AND THE DOS AND DON'TS. PT VOICED UNDERSTANDING AND INFORMED CONSENT SIGNED.  Linna Hoff 05/08/2015, 6:08 PM

## 2015-05-08 NOTE — Anesthesia Procedure Notes (Signed)
Procedure Name: LMA Insertion Date/Time: 05/08/2015 7:57 PM Performed by: Gwenyth AllegraADAMI, Zakery Normington Pre-anesthesia Checklist: Patient identified, Timeout performed, Emergency Drugs available, Patient being monitored and Suction available Patient Re-evaluated:Patient Re-evaluated prior to inductionPreoxygenation: Pre-oxygenation with 100% oxygen Intubation Type: IV induction LMA: LMA inserted LMA Size: 4.0 Placement Confirmation: positive ETCO2 and breath sounds checked- equal and bilateral Tube secured with: Tape Dental Injury: Teeth and Oropharynx as per pre-operative assessment

## 2015-05-08 NOTE — Op Note (Signed)
NAMMarchia Bond:  [name], Edward                ACCOUNT NO.:  [id number]  MEDICAL RECORD NO.:  [id number]  LOCATION:  [facility]                         FACILITY:  [facility]  PHYSICIAN:  Sharma CovertFred W. Shakeel Disney IV, M.D.DATE OF BIRTH:  [birth year]  DATE OF PROCEDURE:  05/08/2015 DATE OF DISCHARGE:                              OPERATIVE REPORT   PREOPERATIVE DIAGNOSIS:  Right thumb infection.  POSTOPERATIVE DIAGNOSIS:  Right thumb infection.  ATTENDING PHYSICIAN:  Sharma CovertFred W. Kharis Lapenna, M.D., who scrubbed and present for the entire procedure.  ASSISTANT SURGEON:  None.  ANESTHESIA:  General via LMA.  PROCEDURES: 1. Incision and drainage of right thumb dorsal abscess. 2. Right thumb debridement of skin and subcutaneous tissue, excisional     debridement of devitalized tissue.  SURGICAL INDICATIONS:  [name] is a right-hand-dominant gentleman, who presented with worsening infection.  The patient does have diabetes, sent from the urgent care, was concerning for increased pain and swelling and for infection.  Risks, benefits, and alternatives were discussed in detail with the patient and signed informed consent was obtained.  Risks include, but not limited to bleeding; infection; damage to nearby nerves, arteries, or tendons; loss of motion of wrist and digits; incomplete relief of symptoms; and need for further surgical intervention.  DESCRIPTION OF PROCEDURE:  The patient was properly identified in the preop holding area, marked with a permanent marker made on the right thumb to indicate correct operative site.  The patient was brought back to the operating room, placed supine on the anesthesia table.  General anesthesia was administered.  The patient tolerated this well.  A well- padded tourniquet placed on the right brachium, sealed with 1000 drape. Right upper extremity was then prepped and draped in normal sterile fashion.  Time-out was called.  The correct site was identified, and procedure then begun.   Attention then turned to the right thumb.  The laceration which had been closed with Dermabond was then excisional debridement of skin and subcutaneous tissue was then carried out and the patient did have some mild purulence.  Incision and drainage of the abscess area was then carried out.  An excisional debridement was then carried out sharply with knife and scissors removing devitalized tissue. Drainage of the abscess area was then carried out and thorough wound irrigation done.  A small exiting hole was then placed dorsally over the dorsal aspect of the hand to allow for a thorough wound irrigation across the dorsum of the hand.  Thorough wound irrigation done.  Once this was carried out, excision of the devitalized tissue.  The wound was then loosely reapproximated with simple Prolene sutures.  Wound cultures had been taken.  Adaptic dressing, sterile compressive bandage then applied.  The patient placed in a well-padded thumb spica splint, extubated, and taken to recovery room in good condition.  POSTPROCEDURE PLAN:  The patient discharged to home, seen back in the office in approximately 4 days for wound check, splint change, go over his wound cultures, continue on the oral antibiotics, and follow him closely.     Madelynn DoneFred W. Tashi Andujo IV, M.D.     FWO/MEDQ  D:  05/08/2015  T:  05/08/2015  Job:  [id number]

## 2015-05-09 ENCOUNTER — Encounter (HOSPITAL_COMMUNITY): Payer: Self-pay | Admitting: Orthopedic Surgery

## 2015-05-09 LAB — HEMOGLOBIN A1C
HEMOGLOBIN A1C: 10.2 % — AB (ref 4.8–5.6)
Mean Plasma Glucose: 246 mg/dL

## 2015-05-11 LAB — CULTURE, ROUTINE-ABSCESS: CULTURE: NO GROWTH

## 2015-05-12 DIAGNOSIS — L089 Local infection of the skin and subcutaneous tissue, unspecified: Secondary | ICD-10-CM | POA: Diagnosis not present

## 2015-05-12 DIAGNOSIS — S61011D Laceration without foreign body of right thumb without damage to nail, subsequent encounter: Secondary | ICD-10-CM | POA: Diagnosis not present

## 2015-05-12 MED FILL — OXYCODONE/APAP 5/325MG: 5-325 | 3 days supply | Qty: 35 | Fill #0

## 2015-05-13 LAB — ANAEROBIC CULTURE

## 2015-05-14 ENCOUNTER — Encounter: Payer: Self-pay | Admitting: Family Medicine

## 2015-05-14 ENCOUNTER — Other Ambulatory Visit: Payer: Self-pay | Admitting: Family Medicine

## 2015-05-14 MED ORDER — FLUCONAZOLE 150 MG PO TABS: 150.0000 mg | ORAL_TABLET | Freq: Every day | ORAL | Status: DC

## 2015-05-14 MED FILL — FLUCONAZOLE 150 MG TABLET: 150 | 1 days supply | Qty: 1 | Fill #0

## 2015-05-14 NOTE — Telephone Encounter (Signed)
Called pt. To inform him that his medication had been sent to the pharmacy.

## 2015-05-19 DIAGNOSIS — M542 Cervicalgia: Secondary | ICD-10-CM | POA: Diagnosis not present

## 2015-05-26 ENCOUNTER — Ambulatory Visit: Payer: Self-pay | Admitting: Internal Medicine

## 2015-05-28 ENCOUNTER — Other Ambulatory Visit: Payer: Self-pay | Admitting: Family Medicine

## 2015-05-28 MED ORDER — FLUCONAZOLE 150 MG PO TABS: 150.0000 mg | ORAL_TABLET | Freq: Every day | ORAL | Status: DC

## 2015-05-28 MED FILL — FLUCONAZOLE 150 MG TABLET: 150 | 1 days supply | Qty: 1 | Fill #0

## 2015-06-20 MED FILL — OXYCODONE/APAP 5/325MG: 5-325 | 10 days supply | Qty: 10 | Fill #0

## 2015-07-15 MED FILL — ACCU-CHEK AVIVA PLUS TEST S: 90 days supply | Qty: 100 | Fill #0

## 2015-08-04 ENCOUNTER — Encounter: Payer: Self-pay | Admitting: Family Medicine

## 2015-08-04 ENCOUNTER — Other Ambulatory Visit: Payer: Self-pay | Admitting: Family Medicine

## 2015-08-04 MED ORDER — FLUCONAZOLE 150 MG PO TABS: 150.0000 mg | ORAL_TABLET | Freq: Every day | ORAL | Status: DC

## 2015-08-04 MED FILL — FLUCONAZOLE 150 MG TABLET: 150 | 1 days supply | Qty: 1 | Fill #1

## 2015-08-18 ENCOUNTER — Other Ambulatory Visit: Payer: Self-pay | Admitting: Family Medicine

## 2015-08-18 MED ORDER — FLUCONAZOLE 150 MG PO TABS: 150.0000 mg | ORAL_TABLET | Freq: Every day | ORAL | Status: DC

## 2015-09-03 ENCOUNTER — Ambulatory Visit (INDEPENDENT_AMBULATORY_CARE_PROVIDER_SITE_OTHER): Payer: 59 | Admitting: Physician Assistant

## 2015-09-03 VITALS — BP 122/80 | HR 82 | Temp 98.1°F | Resp 16 | Ht 68.0 in | Wt 184.4 lb

## 2015-09-03 DIAGNOSIS — M546 Pain in thoracic spine: Secondary | ICD-10-CM

## 2015-09-03 DIAGNOSIS — M25572 Pain in left ankle and joints of left foot: Secondary | ICD-10-CM

## 2015-09-03 DIAGNOSIS — M62830 Muscle spasm of back: Secondary | ICD-10-CM

## 2015-09-03 MED ORDER — CYCLOBENZAPRINE HCL 5 MG PO TABS: 5.0000 mg | ORAL_TABLET | Freq: Three times a day (TID) | ORAL | Status: DC | PRN

## 2015-09-03 MED ORDER — HYDROCODONE-ACETAMINOPHEN 5-325 MG PO TABS: 1.0000 | ORAL_TABLET | Freq: Four times a day (QID) | ORAL | Status: DC | PRN

## 2015-09-03 MED ORDER — MELOXICAM 7.5 MG PO TABS
7.5000 mg | ORAL_TABLET | Freq: Every day | ORAL | Status: DC | PRN
Start: 2015-09-03 — End: 2015-10-10

## 2015-09-03 NOTE — Progress Notes (Signed)
Edward [name]  MRN: [id number] DOB: [birth year]  Subjective:  Edward [name] is a 40 y.o. male seen in office today for a chief complaint of   [place], was riding [employer], nature. Hit a stump, and got thrown of mower. Landed on left shoulder on the ground. Hurt ankle and back pain. Back pain is the primary complaint. Felt a pinpoint pain immediately after on his right mid-lower back but a few hours after the pain spread across entire right mid-lower back. He has intense mid-lower back pain with any movement (even breathing deep). Pt also hurt left ankle. He is able to bear weight and was able to immediately after the incident.   Denies urinary and bowel incontinence, no saddle anesthesia, no numbness or tingling or loss of sensation.    Patient Active Problem List   Diagnosis Date Noted  . Chronic pain of right lower extremity 11/16/2013  . Type II or unspecified type diabetes mellitus with neurological manifestations, not stated as uncontrolled 04/12/2012  . Migraine, unspecified, without mention of intractable migraine without mention of status migrainosus 04/12/2012  . Soft tissue mass 04/12/2012  . Tobacco use disorder 04/12/2012    Current Outpatient Prescriptions on File Prior to Visit  Medication Sig Dispense Refill  . Blood Glucose Monitoring Suppl (TRUE METRIX AIR GLUCOSE METER) w/Device KIT 1 each by Does not apply route daily. 1 kit 1  . gemfibrozil (LOPID) 600 MG tablet Take 1 tablet (600 mg total) by mouth 2 (two) times daily before a meal. 60 tablet 2  . glipiZIDE (GLUCOTROL) 5 MG tablet Take 1 tablet (5 mg total) by mouth 2 (two) times daily before a meal. 60 tablet 3  . glucose blood (TRUE METRIX BLOOD GLUCOSE TEST) test strip Use as instructed 2x a day 100 each 12  . metFORMIN (GLUCOPHAGE) 1000 MG tablet Take 1 tablet (1,000 mg total) by mouth 2 (two) times daily with a meal. 180 tablet 3  . TRUEPLUS LANCETS 33G MISC Use 2x a day - with TrueMetrix meter 100 each 12    No current facility-administered medications on file prior to visit.    No Known Allergies  Review of Systems  Musculoskeletal: Positive for gait problem (has to remain more upright due to back pain). Negative for joint swelling and neck pain.   Objective:  BP 122/80 mmHg  Pulse 82  Temp(Src) 98.1 F (36.7 C) (Oral)  Resp 16  Ht 5' 8"  (1.727 m)  Wt 184 lb 6.4 oz (83.643 kg)  BMI 28.04 kg/m2  SpO2 99%  Physical Exam  Constitutional: He is oriented to person, place, and time and well-developed, well-nourished, and in no distress.  HENT:  Head: Normocephalic and atraumatic.  Eyes: Conjunctivae are normal.  Neck: Normal range of motion.  Pulmonary/Chest: Effort normal.  Musculoskeletal:       Right shoulder: Normal.       Left shoulder: Normal.       Right wrist: Normal.       Left wrist: Normal.       Cervical back: Normal.       Thoracic back: He exhibits decreased range of motion (with lateral and forward flexion and extension). He exhibits no swelling.       Lumbar back: Normal.       Back:       Right lower leg: Normal.       Left lower leg: Normal.       Feet:  Neurological: He is alert  and oriented to person, place, and time. He has normal strength. He displays normal reflexes. Gait normal.  Reflex Scores:      Tricep reflexes are 2+ on the right side and 2+ on the left side.      Patellar reflexes are 2+ on the right side and 2+ on the left side.      Achilles reflexes are 2+ on the right side and 2+ on the left side. Skin: Skin is warm and dry.  Psychiatric: Affect normal.  Vitals reviewed.   Assessment and Plan :   1. Right-sided thoracic back pain -Instructed to avoid heavy lifting or strenuous repetitive activity to prevent recurrence of event. Experiment with both ice and heat and choose whichever feels best for you. Use heat pad or ice pack, do not apply directly to skin, use barrier such as towel over the skin. Leave on for 15-20 minutes, 3-4 times  a day.. Given instructions on back exercises.  - meloxicam (MOBIC) 7.5 MG tablet; Take 1-2 tablets (7.5-15 mg total) by mouth daily as needed for pain.  Dispense: 60 tablet; Refill: 0 - HYDROcodone-acetaminophen (NORCO) 5-325 MG tablet; Take 1 tablet by mouth every 6 (six) hours as needed.  Dispense: 8 tablet; Refill: 0  2. Muscle spasm of back - cyclobenzaprine (FLEXERIL) 5 MG tablet; Take 1 tablet (5 mg total) by mouth 3 (three) times daily as needed for muscle spasms.  Dispense: 60 tablet; Refill: 0  3. Left Ankle Pain -Patient can bear weight on left foot. Pain is much more improve compared to yesterday.  -Use ice pack, do not apply directly to skin, use barrier such as towel over the skin. Leave on for 15-20 minutes, 3-4 times a day and elevate.    -Return to clinic if symptoms worsen, do not improve, or as needed  Tenna Delaine PA-C  [facility] 09/03/2015 9:24 AM

## 2015-09-03 NOTE — Patient Instructions (Addendum)
I recommend resting today. However, tomorrow I would begin walking and moving around as much as tolerated. Begin stretching in a couple of days. The worse thing you can do for low back pain is lie in bed all day or sit down all day. Use medications as needed.   Just to know, flexeril can cause side effects that may impair your thinking or reactions. Be careful if you drive or do anything that requires you to be awake and alert. void drinking alcohol, which can increase some of the side effects of Flexeril.  NSAIDs like meloxicam have common side effects of heartburn, stomach pain, indigestion, and headache. Could lead to renal insufficiency, stroke, or GI bleed if taken excess amounts outside of what is recommended on label long term.    Use pain meds as needed for sleep.   You should avoid heavy lifting or strenuous repetitive activity to prevent recurrence of event. Experiment with both ice and heat and choose whichever feels best for you.  Use heat pad or ice Melanie Openshaw, do not apply directly to skin, use barrier such as towel over the skin. Leave on for 15-20 minutes, 3-4 times a day.  Please perform exercises below. Stretches are to be performed for 2 sets, holding 10-15 seconds each. Recommended to perform this rehab twice daily within pain tolerance for 2 weeks.   FLEXION RANGE OF MOTION AND STRETCHING EXERCISES: STRETCH - Flexion, Single Knee to Chest   Lie on a firm bed or floor with both legs extended in front of you.  Keeping one leg in contact with the floor, bring your opposite knee to your chest. Hold your leg in place by either grabbing behind your thigh or at your knee.  Pull until you feel a gentle stretch in your lower back.   Slowly release your grasp and repeat the exercise with the opposite side.  STRETCH - Flexion, Double Knee to Chest   Lie on a firm bed or floor with both legs extended in front of you.  Keeping one leg in contact with the floor, bring your opposite knee  to your chest.  Tense your stomach muscles to support your back and then lift your other knee to your chest. Hold your legs in place by either grabbing behind your thighs or at your knees.  Pull both knees toward your chest until you feel a gentle stretch in your lower back.   Tense your stomach muscles and slowly return one leg at a time to the floor.  STRETCH - Low Trunk Rotation  Lie on a firm bed or floor. Keeping your legs in front of you, bend your knees so they are both pointed toward the ceiling and your feet are flat on the floor.  Extend your arms out to the side. This will stabilize your upper body by keeping your shoulders in contact with the floor.  Gently and slowly drop both knees together to one side until you feel a gentle stretch in your lower back.   Tense your stomach muscles to support your lower back as you bring your knees back to the starting position. Repeat the exercise to the other side.   EXTENSION RANGE OF MOTION AND FLEXIBILITY EXERCISES: STRETCH - Extension, Prone on Elbows   Lie on your stomach on the floor, a bed will be too soft. Place your palms about shoulder width apart and at the height of your head.  Place your elbows under your shoulders. If this is too painful, stack pillows  under your chest.  Allow your body to relax so that your hips drop lower and make contact more completely with the floor.  Slowly return to lying flat on the floor.  RANGE OF MOTION - Extension, Prone Press Ups  Lie on your stomach on the floor, a bed will be too soft. Place your palms about shoulder width apart and at the height of your head.  Keeping your back as relaxed as possible, slowly straighten your elbows while keeping your hips on the floor. You may adjust the placement of your hands to maximize your comfort. As you gain motion, your hands will come more underneath your shoulders.  Slowly return to lying flat on the floor.  RANGE OF MOTION- Quadruped,  Neutral Spine   Assume a hands and knees position on a firm surface. Keep your hands under your shoulders and your knees under your hips. You may place padding under your knees for comfort.  Drop your head and point your tail bone toward the ground below you. This will round out your lower back like an angry cat.    Slowly lift your head and release your tail bone so that your back sags into a large arch, like an old horse.  Repeat this until you feel limber in your lower back.  Now, find your "sweet spot." This will be the most comfortable position somewhere between the two previous positions. This is your neutral spine. Once you have found this position, tense your stomach muscles to support your lower back.  STRENGTHENING EXERCISES - Low Back Strain These exercises may help you when beginning to rehabilitate your injury. These exercises should be done near your "sweet spot." This is the neutral, low-back arch, somewhere between fully rounded and fully arched, that is your least painful position. When performed in this safe range of motion, these exercises can be used for people who have either a flexion or extension based injury. These exercises may resolve your symptoms with or without further involvement from your physician, physical therapist or athletic trainer. While completing these exercises, remember:   Muscles can gain both the endurance and the strength needed for everyday activities through controlled exercises.  Complete these exercises as instructed by your physician, physical therapist or athletic trainer. Increase the resistance and repetitions only as guided.  You may experience muscle soreness or fatigue, but the pain or discomfort you are trying to eliminate should never worsen during these exercises. If this pain does worsen, stop and make certain you are following the directions exactly. If the pain is still present after adjustments, discontinue the exercise until you can  discuss the trouble with your caregiver.  STRENGTHENING - Deep Abdominals, Pelvic Tilt  Lie on a firm bed or floor. Keeping your legs in front of you, bend your knees so they are both pointed toward the ceiling and your feet are flat on the floor.  Tense your lower abdominal muscles to press your lower back into the floor. This motion will rotate your pelvis so that your tail bone is scooping upwards rather than pointing at your feet or into the floor.  STRENGTHENING - Abdominals, Crunches   Lie on a firm bed or floor. Keeping your legs in front of you, bend your knees so they are both pointed toward the ceiling and your feet are flat on the floor. Cross your arms over your chest.  Slightly tip your chin down without bending your neck.  Tense your abdominals and slowly lift your trunk  high enough to just clear your shoulder blades. Lifting higher can put excessive stress on the lower back and does not further strengthen your abdominal muscles.  Control your return to the starting position.  STRENGTHENING - Quadruped, Opposite UE/LE Lift   Assume a hands and knees position on a firm surface. Keep your hands under your shoulders and your knees under your hips. You may place padding under your knees for comfort.  Find your neutral spine and gently tense your abdominal muscles so that you can maintain this position. Your shoulders and hips should form a rectangle that is parallel with the floor and is not twisted.  Keeping your trunk steady, lift your right hand no higher than your shoulder and then your left leg no higher than your hip. Make sure you are not holding your breath.   Continuing to keep your abdominal muscles tense and your back steady, slowly return to your starting position. Repeat with the opposite arm and leg.  STRENGTHENING - Lower Abdominals, Double Knee Lift  Lie on a firm bed or floor. Keeping your legs in front of you, bend your knees so they are both pointed toward  the ceiling and your feet are flat on the floor.  Tense your abdominal muscles to brace your lower back and slowly lift both of your knees until they come over your hips. Be certain not to hold your breath.  POSTURE AND BODY MECHANICS CONSIDERATIONS - Low Back Strain Keeping correct posture when sitting, standing or completing your activities will reduce the stress put on different body tissues, allowing injured tissues a chance to heal and limiting painful experiences. The following are general guidelines for improved posture. Your physician or physical therapist will provide you with any instructions specific to your needs. While reading these guidelines, remember:  The exercises prescribed by your provider will help you have the flexibility and strength to maintain correct postures.  The correct posture provides the best environment for your joints to work. All of your joints have less wear and tear when properly supported by a spine with good posture. This means you will experience a healthier, less painful body.  Correct posture must be practiced with all of your activities, especially prolonged sitting and standing. Correct posture is as important when doing repetitive low-stress activities (typing) as it is when doing a single heavy-load activity (lifting). RESTING POSITIONS Consider which positions are most painful for you when choosing a resting position. If you have pain with flexion-based activities (sitting, bending, stooping, squatting), choose a position that allows you to rest in a less flexed posture. You would want to avoid curling into a fetal position on your side. If your pain worsens with extension-based activities (prolonged standing, working overhead), avoid resting in an extended position such as sleeping on your stomach. Most people will find more comfort when they rest with their spine in a more neutral position, neither too rounded nor too arched. Lying on a non-sagging bed on  your side with a pillow between your knees, or on your back with a pillow under your knees will often provide some relief. Keep in mind, being in any one position for a prolonged period of time, no matter how correct your posture, can still lead to stiffness. PROPER SITTING POSTURE In order to minimize stress and discomfort on your spine, you must sit with correct posture. Sitting with good posture should be effortless for a healthy body. Returning to good posture is a gradual process. Many people can  work toward this most comfortably by using various supports until they have the flexibility and strength to maintain this posture on their own. When sitting with proper posture, your ears will fall over your shoulders and your shoulders will fall over your hips. You should use the back of the chair to support your upper back. Your lower back will be in a neutral position, just slightly arched. You may place a small pillow or folded towel at the base of your lower back for support.  When working at a desk, create an environment that supports good, upright posture. Without extra support, muscles tire, which leads to excessive strain on joints and other tissues. Keep these recommendations in mind: CHAIR:  A chair should be able to slide under your desk when your back makes contact with the back of the chair. This allows you to work closely.  The chair's height should allow your eyes to be level with the upper part of your monitor and your hands to be slightly lower than your elbows. BODY POSITION  Your feet should make contact with the floor. If this is not possible, use a foot rest.  Keep your ears over your shoulders. This will reduce stress on your neck and lower back. INCORRECT SITTING POSTURES  If you are feeling tired and unable to assume a healthy sitting posture, do not slouch or slump. This puts excessive strain on your back tissues, causing more damage and pain. Healthier options  include:  Using more support, like a lumbar pillow.  Switching tasks to something that requires you to be upright or walking.  Talking a brief walk.  Lying down to rest in a neutral-spine position. PROLONGED STANDING WHILE SLIGHTLY LEANING FORWARD  When completing a task that requires you to lean forward while standing in one place for a long time, place either foot up on a stationary 2-4 inch high object to help maintain the best posture. When both feet are on the ground, the lower back tends to lose its slight inward curve. If this curve flattens (or becomes too large), then the back and your other joints will experience too much stress, tire more quickly, and can cause pain. CORRECT STANDING POSTURES Proper standing posture should be assumed with all daily activities, even if they only take a few moments, like when brushing your teeth. As in sitting, your ears should fall over your shoulders and your shoulders should fall over your hips. You should keep a slight tension in your abdominal muscles to brace your spine. Your tailbone should point down to the ground, not behind your body, resulting in an over-extended swayback posture.  INCORRECT STANDING POSTURES  Common incorrect standing postures include a forward head, locked knees and/or an excessive swayback. WALKING Walk with an upright posture. Your ears, shoulders and hips should all line-up. PROLONGED ACTIVITY IN A FLEXED POSITION When completing a task that requires you to bend forward at your waist or lean over a low surface, try to find a way to stabilize 3 out of 4 of your limbs. You can place a hand or elbow on your thigh or rest a knee on the surface you are reaching across. This will provide you more stability so that your muscles do not fatigue as quickly. By keeping your knees relaxed, or slightly bent, you will also reduce stress across your lower back. CORRECT LIFTING TECHNIQUES DO :   Assume a wide stance. This will provide  you more stability and the opportunity to get as  close as possible to the object which you are lifting.  Tense your abdominals to brace your spine. Bend at the knees and hips. Keeping your back locked in a neutral-spine position, lift using your leg muscles. Lift with your legs, keeping your back straight.  Test the weight of unknown objects before attempting to lift them.  Try to keep your elbows locked down at your sides in order get the best strength from your shoulders when carrying an object.  Always ask for help when lifting heavy or awkward objects. INCORRECT LIFTING TECHNIQUES DO NOT:   Lock your knees when lifting, even if it is a small object.  Bend and twist. Pivot at your feet or move your feet when needing to change directions.  Assume that you can safely pick up even a paper clip without proper posture.      IF you received an x-ray today, you will receive an invoice from [facility]. Please contact [facility] at [phone] with questions or concerns regarding your invoice.   IF you received labwork today, you will receive an invoice from [employer]. Please contact [facility] at [phone] with questions or concerns regarding your invoice.   Our billing staff will not be able to assist you with questions regarding bills from these companies.  You will be contacted with the lab results as soon as they are available. The fastest way to get your results is to activate your My Chart account. Instructions are located on the last page of this paperwork. If you have not heard from [place] regarding the results in 2 weeks, please contact this office.

## 2015-09-04 ENCOUNTER — Encounter: Payer: Self-pay | Admitting: Physician Assistant

## 2015-09-29 ENCOUNTER — Encounter (HOSPITAL_COMMUNITY): Payer: Self-pay | Admitting: Emergency Medicine

## 2015-09-29 ENCOUNTER — Emergency Department (HOSPITAL_COMMUNITY)
Admission: EM | Admit: 2015-09-29 | Discharge: 2015-09-30 | Disposition: A | Discharge status: Home / Self Care | Payer: 59 | Attending: Emergency Medicine | Admitting: Emergency Medicine

## 2015-09-29 DIAGNOSIS — L0231 Cutaneous abscess of buttock: Secondary | ICD-10-CM | POA: Insufficient documentation

## 2015-09-29 DIAGNOSIS — E119 Type 2 diabetes mellitus without complications: Secondary | ICD-10-CM | POA: Insufficient documentation

## 2015-09-29 DIAGNOSIS — Z7984 Long term (current) use of oral hypoglycemic drugs: Secondary | ICD-10-CM | POA: Insufficient documentation

## 2015-09-29 DIAGNOSIS — F17210 Nicotine dependence, cigarettes, uncomplicated: Secondary | ICD-10-CM | POA: Diagnosis not present

## 2015-09-29 DIAGNOSIS — L0291 Cutaneous abscess, unspecified: Secondary | ICD-10-CM

## 2015-09-29 LAB — CBG MONITORING, ED: Glucose-Capillary: 285 mg/dL — ABNORMAL HIGH (ref 65–99)

## 2015-09-29 NOTE — ED Triage Notes (Signed)
Pt states that he has had an abscess on his perineal area behind his testicles x 2 weeks. States that it has been draining some but is still tight. He is diabetic. Alert and oriented.

## 2015-09-29 NOTE — ED Notes (Signed)
PT CBG [id number]

## 2015-09-30 MED ORDER — LIDOCAINE-EPINEPHRINE-TETRACAINE (LET) SOLUTION
3.0000 mL | Freq: Once | NASAL | Status: AC
  Administered 2015-09-30: 3 mL via TOPICAL
  Filled 2015-09-30: qty 3

## 2015-09-30 MED ORDER — LIDOCAINE HCL (PF) 1 % IJ SOLN
5.0000 mL | Freq: Once | INTRAMUSCULAR | Status: AC
  Administered 2015-09-30: 5 mL
  Filled 2015-09-30: qty 30

## 2015-09-30 MED ORDER — OXYCODONE-ACETAMINOPHEN 5-325 MG PO TABS
1.0000 | ORAL_TABLET | Freq: Once | ORAL | Status: AC
  Administered 2015-09-30: 1 via ORAL
  Filled 2015-09-30: qty 1

## 2015-09-30 MED ORDER — CEPHALEXIN 500 MG PO CAPS: 500.0000 mg | ORAL_CAPSULE | Freq: Four times a day (QID) | ORAL | 0 refills | Status: DC

## 2015-09-30 MED ORDER — OXYCODONE-ACETAMINOPHEN 5-325 MG PO TABS: 1.0000 | ORAL_TABLET | ORAL | 0 refills | Status: DC | PRN

## 2015-09-30 NOTE — ED Provider Notes (Signed)
[facility] DEPT Provider Note   CSN: [id number] Arrival date & time: 09/29/15  2036   First MD Initiated Contact with Patient 09/30/15 0029   By signing my name below, I, Dyke Brackett, attest that this documentation has been prepared under the direction and in the presence of non-physician practitioner, Nehemiah Settle A Illyria Sobocinski PA-C. Electronically Signed: Dyke Brackett, Scribe. 09/30/2015. 12:38 AM.   History   Chief Complaint Chief Complaint  Patient presents with  . Abscess    HPI ORLANDIS SANDEN is a 40 y.o. male with PMHx of DM who presents to the Emergency Department complaining of gradually worsening lesion on his perineal area for two weeks. No alleviating or modifying factors noted. He notes associated erythema, draining and bleeding. Pt denies fever and vomiting. He states he normally takes metformin for his DM, but he has had significant weight loss and diet modifications, so does not take metformin any longer.   The history is provided by the patient. No language interpreter was used.    Past Medical History:  Diagnosis Date  . Chicken pox   . Diabetes mellitus without complication ([place])   . Increased frequency of headaches     Patient Active Problem List   Diagnosis Date Noted  . Chronic pain of right lower extremity 11/16/2013  . Type II or unspecified type diabetes mellitus with neurological manifestations, not stated as uncontrolled 04/12/2012  . Migraine, unspecified, without mention of intractable migraine without mention of status migrainosus 04/12/2012  . Soft tissue mass 04/12/2012  . Tobacco use disorder 04/12/2012    Past Surgical History:  Procedure Laterality Date  . I&D EXTREMITY Right 05/08/2015   Procedure: IRRIGATION AND DEBRIDEMENT THUMB;  Surgeon: Iran Planas, MD;  Location: [place];  Service: Orthopedics;  Laterality: Right;  . INCISE AND DRAIN ABCESS     left hand and jaw     Home Medications    Prior to Admission medications   Medication  Sig Start Date End Date Taking? Authorizing Provider  acetaminophen (TYLENOL) 500 MG tablet Take 500 mg by mouth every 6 (six) hours as needed (for pain.).   Yes Historical Provider, MD  Blood Glucose Monitoring Suppl (TRUE METRIX AIR GLUCOSE METER) w/Device KIT 1 each by Does not apply route daily. Patient not taking: Reported on 09/29/2015 04/29/15   Philemon Kingdom, MD  cyclobenzaprine (FLEXERIL) 5 MG tablet Take 1 tablet (5 mg total) by mouth 3 (three) times daily as needed for muscle spasms. Patient not taking: Reported on 09/29/2015 09/03/15   Leonie Douglas, PA-C  gemfibrozil (LOPID) 600 MG tablet Take 1 tablet (600 mg total) by mouth 2 (two) times daily before a meal. Patient not taking: Reported on 09/29/2015 02/26/14   Jaynee Eagles, PA-C  glipiZIDE (GLUCOTROL) 5 MG tablet Take 1 tablet (5 mg total) by mouth 2 (two) times daily before a meal. Patient not taking: Reported on 09/29/2015 04/29/15   Philemon Kingdom, MD  glucose blood (TRUE METRIX BLOOD GLUCOSE TEST) test strip Use as instructed 2x a day Patient not taking: Reported on 09/29/2015 04/29/15   Philemon Kingdom, MD  HYDROcodone-acetaminophen (NORCO) 5-325 MG tablet Take 1 tablet by mouth every 6 (six) hours as needed. Patient not taking: Reported on 09/29/2015 09/03/15   Leonie Douglas, PA-C  meloxicam (MOBIC) 7.5 MG tablet Take 1-2 tablets (7.5-15 mg total) by mouth daily as needed for pain. Patient not taking: Reported on 09/29/2015 09/03/15   Leonie Douglas, PA-C  metFORMIN (GLUCOPHAGE) 1000 MG tablet Take 1  tablet (1,000 mg total) by mouth 2 (two) times daily with a meal. Patient not taking: Reported on 09/29/2015 02/25/14   Jaynee Eagles, PA-C  TRUEPLUS LANCETS 33G MISC Use 2x a day - with TrueMetrix meter Patient not taking: Reported on 09/29/2015 04/29/15   Philemon Kingdom, MD    Family History Family History  Problem Relation Age of Onset  . Heart disease Mother   . Diabetes Mother   . Hyperlipidemia Father   . Stroke  Father   . Hypertension Father   . Lung cancer Father   . Cancer Paternal Grandfather     prostate    Social History Social History  Substance Use Topics  . Smoking status: Current Every Day Smoker    Packs/day: 1.00    Years: 29.00    Types: Cigarettes  . Smokeless tobacco: Never Used  . Alcohol use No     Allergies   Review of patient's allergies indicates no known allergies.   Review of Systems Review of Systems  Constitutional: Negative for fever.  Gastrointestinal: Negative for vomiting.  Musculoskeletal: Negative for myalgias.  Skin: Positive for color change and wound.    Physical Exam Updated Vital Signs BP 132/87 (BP Location: Left Arm)   Pulse 80   Temp 98.4 F (36.9 C) (Oral)   Resp 18   SpO2 98%   Physical Exam  Constitutional: He appears well-developed and well-nourished.  HENT:  Head: Normocephalic and atraumatic.  Eyes: Conjunctivae are normal.  Neck: Neck supple.  Cardiovascular: Normal rate.   Pulmonary/Chest: Effort normal.  Abdominal: Soft.  Genitourinary:  Genitourinary Comments: No perirectal pain. Scrotum and testicles non tender.   Neurological: He is alert.  Skin: Skin is warm and dry.  3 cm indurated, tender, swollen area to the left inferior buttock with a central pustule.   Psychiatric: He has a normal mood and affect.  Nursing note and vitals reviewed.   ED Treatments / Results  DIAGNOSTIC STUDIES:  Oxygen Saturation is 98% on RA, normal by my interpretation.    COORDINATION OF CARE: 12:36 AM Chaperone (scribe) was present for exam which was performed with no discomfort or complications.   12:37 AM Will drain abscess. Discussed treatment plan with pt at bedside and pt agreed to plan.  Labs (all labs ordered are listed, but only abnormal results are displayed) Labs Reviewed  CBG MONITORING, ED - Abnormal; Notable for the following:       Result Value   Glucose-Capillary 285 (*)    All other components within normal  limits    EKG  EKG Interpretation None       Radiology No results found.  Procedures Procedures (including critical care time) INCISION AND DRAINAGE PROCEDURE NOTE: Patient identification was confirmed and verbal consent was obtained. This procedure was performed by Dewaine Oats PA-C at 12:38 AM. Site: inferior left buttock Sterile procedures observed no Needle size: 25G Anesthetic used (type and amt): L.E.T., 1% lidocaine w/o epi Blade size: #11 Drainage: moderate amount purulent and bloody Complexity: Complex Packing used none Site anesthetized, incision made over site, wound drained and explored loculations, rinsed with copious amounts of normal saline, covered with dry, sterile dressing.  Pt tolerated procedure well without complications.  Instructions for care discussed verbally and pt provided with additional written instructions for homecare and f/u.   Medications Ordered in ED Medications - No data to display   Initial Impression / Assessment and Plan / ED Course  I have reviewed the triage vital  signs and the nursing notes.  Pertinent labs & imaging results that were available during my care of the patient were reviewed by me and considered in my medical decision making (see chart for details).  Clinical Course    Patient is diet controlled diabetic with complaint of uncomplicated abscess to buttock. I&D performed with good outcome. He is stable for discharge home with care instructions, abx and return precautions.  Final Clinical Impressions(s) / ED Diagnoses   Final diagnoses:  None  1. Cutaneous abscess  I personally performed the services described in this documentation, which was scribed in my presence. The recorded information has been reviewed and is accurate.    New Prescriptions New Prescriptions   No medications on file     Charlann Lange, Hershal Coria [id number]    Delora Fuel, MD 51/70/01 [id number]

## 2015-10-10 ENCOUNTER — Inpatient Hospital Stay (HOSPITAL_COMMUNITY): Payer: 59 | Admitting: Anesthesiology

## 2015-10-10 ENCOUNTER — Encounter (HOSPITAL_COMMUNITY)
Admission: EM | Disposition: A | Discharge status: Home / Self Care | Payer: Self-pay | Source: Home / Self Care | Attending: Internal Medicine

## 2015-10-10 ENCOUNTER — Emergency Department (HOSPITAL_COMMUNITY): Payer: 59

## 2015-10-10 ENCOUNTER — Inpatient Hospital Stay (HOSPITAL_COMMUNITY): Payer: 59

## 2015-10-10 ENCOUNTER — Encounter (HOSPITAL_COMMUNITY): Payer: Self-pay | Admitting: Emergency Medicine

## 2015-10-10 ENCOUNTER — Inpatient Hospital Stay (HOSPITAL_COMMUNITY)
Admission: EM | Admit: 2015-10-10 | Discharge: 2015-10-12 | DRG: 728 | Disposition: A | Discharge status: Home / Self Care | Payer: 59 | Attending: Internal Medicine | Admitting: Internal Medicine

## 2015-10-10 DIAGNOSIS — N5082 Scrotal pain: Secondary | ICD-10-CM | POA: Diagnosis present

## 2015-10-10 DIAGNOSIS — E119 Type 2 diabetes mellitus without complications: Secondary | ICD-10-CM | POA: Diagnosis not present

## 2015-10-10 DIAGNOSIS — F17210 Nicotine dependence, cigarettes, uncomplicated: Secondary | ICD-10-CM | POA: Diagnosis present

## 2015-10-10 DIAGNOSIS — R0602 Shortness of breath: Secondary | ICD-10-CM | POA: Diagnosis not present

## 2015-10-10 DIAGNOSIS — E1100 Type 2 diabetes mellitus with hyperosmolarity without nonketotic hyperglycemic-hyperosmolar coma (NKHHC): Secondary | ICD-10-CM

## 2015-10-10 DIAGNOSIS — Z7984 Long term (current) use of oral hypoglycemic drugs: Secondary | ICD-10-CM | POA: Diagnosis not present

## 2015-10-10 DIAGNOSIS — B952 Enterococcus as the cause of diseases classified elsewhere: Secondary | ICD-10-CM | POA: Diagnosis present

## 2015-10-10 DIAGNOSIS — I10 Essential (primary) hypertension: Secondary | ICD-10-CM | POA: Diagnosis present

## 2015-10-10 DIAGNOSIS — E785 Hyperlipidemia, unspecified: Secondary | ICD-10-CM | POA: Diagnosis present

## 2015-10-10 DIAGNOSIS — N492 Inflammatory disorders of scrotum: Secondary | ICD-10-CM | POA: Diagnosis not present

## 2015-10-10 DIAGNOSIS — R102 Pelvic and perineal pain: Secondary | ICD-10-CM | POA: Diagnosis not present

## 2015-10-10 DIAGNOSIS — Z01818 Encounter for other preprocedural examination: Secondary | ICD-10-CM | POA: Diagnosis not present

## 2015-10-10 DIAGNOSIS — M546 Pain in thoracic spine: Secondary | ICD-10-CM

## 2015-10-10 DIAGNOSIS — F17200 Nicotine dependence, unspecified, uncomplicated: Secondary | ICD-10-CM | POA: Diagnosis present

## 2015-10-10 DIAGNOSIS — G43909 Migraine, unspecified, not intractable, without status migrainosus: Secondary | ICD-10-CM | POA: Diagnosis present

## 2015-10-10 HISTORY — PX: IRRIGATION AND DEBRIDEMENT ABSCESS: SHX5252

## 2015-10-10 LAB — BASIC METABOLIC PANEL
Anion gap: 6 (ref 5–15)
BUN: 13 mg/dL (ref 6–20)
CO2: 24 mmol/L (ref 22–32)
Calcium: 8.5 mg/dL — ABNORMAL LOW (ref 8.9–10.3)
Chloride: 106 mmol/L (ref 101–111)
Creatinine, Ser: 0.68 mg/dL (ref 0.61–1.24)
GFR calc Af Amer: >60 mL/min (ref >60)
GFR calc non Af Amer: >60 mL/min (ref >60)
Glucose, Bld: 245 mg/dL — ABNORMAL HIGH (ref 65–99)
Potassium: 4.1 mmol/L (ref 3.5–5.1)
Sodium: 136 mmol/L (ref 135–145)

## 2015-10-10 LAB — CBC WITH DIFFERENTIAL/PLATELET
Basophils Absolute: 0 10*3/uL (ref 0.0–0.1)
Basophils Relative: 0 %
Eosinophils Absolute: 0.1 10*3/uL (ref 0.0–0.7)
Eosinophils Relative: 1 %
HCT: 43.1 % (ref 39.0–52.0)
Hemoglobin: 14.8 g/dL (ref 13.0–17.0)
Lymphocytes Relative: 17 %
Lymphs Abs: 2 10*3/uL (ref 0.7–4.0)
MCH: 32.5 pg (ref 26.0–34.0)
MCHC: 34.3 g/dL (ref 30.0–36.0)
MCV: 94.5 fL (ref 78.0–100.0)
Monocytes Absolute: 0.7 10*3/uL (ref 0.1–1.0)
Monocytes Relative: 6 %
Neutro Abs: 9 10*3/uL — ABNORMAL HIGH (ref 1.7–7.7)
Neutrophils Relative %: 76 %
Platelets: 225 10*3/uL (ref 150–400)
RBC: 4.56 MIL/uL (ref 4.22–5.81)
RDW: 12.7 % (ref 11.5–15.5)
WBC: 11.8 10*3/uL — ABNORMAL HIGH (ref 4.0–10.5)

## 2015-10-10 LAB — GLUCOSE, CAPILLARY
GLUCOSE-CAPILLARY: 76 mg/dL (ref 65–99)
GLUCOSE-CAPILLARY: 92 mg/dL (ref 65–99)
GLUCOSE-CAPILLARY: 121 mg/dL — AB (ref 65–99)
Glucose-Capillary: 93 mg/dL (ref 65–99)

## 2015-10-10 LAB — PROTIME-INR
INR: 1.03
PROTHROMBIN TIME: 13.5 s (ref 11.4–15.2)

## 2015-10-10 LAB — CBG MONITORING, ED
GLUCOSE-CAPILLARY: 276 mg/dL — AB (ref 65–99)
GLUCOSE-CAPILLARY: 231 mg/dL — AB (ref 65–99)
Glucose-Capillary: 172 mg/dL — ABNORMAL HIGH (ref 65–99)

## 2015-10-10 SURGERY — IRRIGATION AND DEBRIDEMENT ABSCESS
Anesthesia: General | Site: Scrotum

## 2015-10-10 MED ORDER — SODIUM CHLORIDE 0.9 % IV SOLN
INTRAVENOUS | Status: DC
  Administered 2015-10-10: 2.2 [IU]/h via INTRAVENOUS
  Filled 2015-10-10: qty 2.5

## 2015-10-10 MED ORDER — 0.9 % SODIUM CHLORIDE (POUR BTL) OPTIME
TOPICAL | Status: DC | PRN
  Administered 2015-10-10: 1000 mL

## 2015-10-10 MED ORDER — SODIUM CHLORIDE 0.9 % IV SOLN
INTRAVENOUS | Status: DC
Start: 2015-10-10 — End: 2015-10-10

## 2015-10-10 MED ORDER — LACTATED RINGERS IV SOLN
INTRAVENOUS | Status: DC | PRN
  Administered 2015-10-10: 18:00:00 via INTRAVENOUS

## 2015-10-10 MED ORDER — SODIUM CHLORIDE 0.9 % IV SOLN
INTRAVENOUS | Status: DC
  Administered 2015-10-10 (×2): via INTRAVENOUS

## 2015-10-10 MED ORDER — HYDROMORPHONE HCL 1 MG/ML IJ SOLN
INTRAMUSCULAR | Status: AC
  Administered 2015-10-10: 0.5 mg via INTRAVENOUS
  Filled 2015-10-10: qty 1

## 2015-10-10 MED ORDER — MORPHINE SULFATE (PF) 2 MG/ML IV SOLN
2.0000 mg | INTRAVENOUS | Status: DC | PRN
Start: 2015-10-10 — End: 2015-10-12
  Administered 2015-10-10 – 2015-10-12 (×7): 2 mg via INTRAVENOUS
  Filled 2015-10-10 (×7): qty 1

## 2015-10-10 MED ORDER — FENTANYL CITRATE (PF) 100 MCG/2ML IJ SOLN
INTRAMUSCULAR | Status: DC | PRN
  Administered 2015-10-10 (×2): 50 ug via INTRAVENOUS

## 2015-10-10 MED ORDER — LIDOCAINE 2% (20 MG/ML) 5 ML SYRINGE
INTRAMUSCULAR | Status: DC | PRN
  Administered 2015-10-10: 100 mg via INTRAVENOUS

## 2015-10-10 MED ORDER — SODIUM CHLORIDE 0.9 % IV SOLN
1.5000 g | Freq: Four times a day (QID) | INTRAVENOUS | Status: DC
  Administered 2015-10-10 – 2015-10-12 (×6): 1.5 g via INTRAVENOUS
  Filled 2015-10-10 (×9): qty 1.5

## 2015-10-10 MED ORDER — IOPAMIDOL (ISOVUE-300) INJECTION 61%
100.0000 mL | Freq: Once | INTRAVENOUS | Status: AC | PRN
  Administered 2015-10-10: 100 mL via INTRAVENOUS

## 2015-10-10 MED ORDER — SENNOSIDES-DOCUSATE SODIUM 8.6-50 MG PO TABS: 1.0000 | ORAL_TABLET | Freq: Every evening | ORAL | Status: DC | PRN

## 2015-10-10 MED ORDER — ONDANSETRON HCL 4 MG/2ML IJ SOLN
INTRAMUSCULAR | Status: AC
Start: 2015-10-10 — End: 2015-10-10
  Filled 2015-10-10: qty 2

## 2015-10-10 MED ORDER — HYDROMORPHONE HCL 1 MG/ML IJ SOLN
0.2500 mg | INTRAMUSCULAR | Status: DC | PRN
  Administered 2015-10-10: 0.5 mg via INTRAVENOUS

## 2015-10-10 MED ORDER — MIDAZOLAM HCL 2 MG/2ML IJ SOLN
INTRAMUSCULAR | Status: AC
  Filled 2015-10-10: qty 2

## 2015-10-10 MED ORDER — PROPOFOL 10 MG/ML IV BOLUS
INTRAVENOUS | Status: DC | PRN
  Administered 2015-10-10: 200 mg via INTRAVENOUS

## 2015-10-10 MED ORDER — INSULIN ASPART 100 UNIT/ML ~~LOC~~ SOLN
0.0000 [IU] | Freq: Three times a day (TID) | SUBCUTANEOUS | Status: DC
  Administered 2015-10-11: 4 [IU] via SUBCUTANEOUS
  Administered 2015-10-11: 12 [IU] via SUBCUTANEOUS

## 2015-10-10 MED ORDER — HYDROCODONE-ACETAMINOPHEN 5-325 MG PO TABS
1.0000 | ORAL_TABLET | ORAL | Status: DC | PRN
  Administered 2015-10-10 – 2015-10-12 (×6): 2 via ORAL
  Filled 2015-10-10 (×6): qty 2

## 2015-10-10 MED ORDER — INSULIN REGULAR BOLUS VIA INFUSION
0.0000 [IU] | Freq: Three times a day (TID) | INTRAVENOUS | Status: DC
  Filled 2015-10-10: qty 10

## 2015-10-10 MED ORDER — NICOTINE 14 MG/24HR TD PT24
14.0000 mg | MEDICATED_PATCH | Freq: Every day | TRANSDERMAL | Status: DC
  Administered 2015-10-10: 14 mg via TRANSDERMAL
  Filled 2015-10-10: qty 1

## 2015-10-10 MED ORDER — PROPOFOL 10 MG/ML IV BOLUS
INTRAVENOUS | Status: AC
  Filled 2015-10-10: qty 20

## 2015-10-10 MED ORDER — ONDANSETRON HCL 4 MG/2ML IJ SOLN
4.0000 mg | Freq: Four times a day (QID) | INTRAMUSCULAR | Status: DC | PRN
Start: 2015-10-10 — End: 2015-10-12

## 2015-10-10 MED ORDER — DEXTROSE 50 % IV SOLN: 25.0000 mL | INTRAVENOUS | Status: DC | PRN

## 2015-10-10 MED ORDER — HEPARIN SODIUM (PORCINE) 5000 UNIT/ML IJ SOLN
5000.0000 [IU] | Freq: Three times a day (TID) | INTRAMUSCULAR | Status: DC
  Administered 2015-10-10 – 2015-10-12 (×5): 5000 [IU] via SUBCUTANEOUS
  Filled 2015-10-10 (×5): qty 1

## 2015-10-10 MED ORDER — GEMFIBROZIL 600 MG PO TABS
600.0000 mg | ORAL_TABLET | Freq: Two times a day (BID) | ORAL | Status: DC
  Administered 2015-10-10 – 2015-10-12 (×4): 600 mg via ORAL
  Filled 2015-10-10 (×4): qty 1

## 2015-10-10 MED ORDER — ONDANSETRON HCL 4 MG PO TABS: 4.0000 mg | ORAL_TABLET | Freq: Four times a day (QID) | ORAL | Status: DC | PRN

## 2015-10-10 MED ORDER — SODIUM CHLORIDE 0.9 % IJ SOLN
INTRAMUSCULAR | Status: AC
  Filled 2015-10-10: qty 10

## 2015-10-10 MED ORDER — MIDAZOLAM HCL 5 MG/5ML IJ SOLN
INTRAMUSCULAR | Status: DC | PRN
  Administered 2015-10-10: 2 mg via INTRAVENOUS

## 2015-10-10 MED ORDER — EPHEDRINE SULFATE 50 MG/ML IJ SOLN
INTRAMUSCULAR | Status: AC
  Filled 2015-10-10: qty 1

## 2015-10-10 MED ORDER — FENTANYL CITRATE (PF) 250 MCG/5ML IJ SOLN
INTRAMUSCULAR | Status: AC
  Filled 2015-10-10: qty 5

## 2015-10-10 MED ORDER — INSULIN DETEMIR 100 UNIT/ML ~~LOC~~ SOLN
20.0000 [IU] | Freq: Every day | SUBCUTANEOUS | Status: DC
  Administered 2015-10-11: 20 [IU] via SUBCUTANEOUS
  Filled 2015-10-10 (×2): qty 0.2

## 2015-10-10 MED ORDER — HYDROMORPHONE HCL 1 MG/ML IJ SOLN
1.0000 mg | Freq: Once | INTRAMUSCULAR | Status: AC
  Administered 2015-10-10: 1 mg via INTRAVENOUS
  Filled 2015-10-10: qty 1

## 2015-10-10 MED ORDER — CLINDAMYCIN PHOSPHATE 900 MG/50ML IV SOLN
900.0000 mg | Freq: Once | INTRAVENOUS | Status: AC
  Administered 2015-10-10: 900 mg via INTRAVENOUS
  Filled 2015-10-10: qty 50

## 2015-10-10 MED ORDER — LIDOCAINE HCL (CARDIAC) 20 MG/ML IV SOLN
INTRAVENOUS | Status: AC
  Filled 2015-10-10: qty 5

## 2015-10-10 MED ORDER — FENTANYL CITRATE (PF) 100 MCG/2ML IJ SOLN
100.0000 ug | Freq: Once | INTRAMUSCULAR | Status: AC
  Administered 2015-10-10: 100 ug via INTRAVENOUS
  Filled 2015-10-10: qty 2

## 2015-10-10 MED ORDER — CLINDAMYCIN PHOSPHATE 600 MG/50ML IV SOLN
600.0000 mg | Freq: Three times a day (TID) | INTRAVENOUS | Status: DC
  Administered 2015-10-10 – 2015-10-12 (×5): 600 mg via INTRAVENOUS
  Filled 2015-10-10 (×5): qty 50

## 2015-10-10 MED ORDER — INSULIN DETEMIR 100 UNIT/ML ~~LOC~~ SOLN
10.0000 [IU] | Freq: Once | SUBCUTANEOUS | Status: AC
  Administered 2015-10-10: 10 [IU] via SUBCUTANEOUS
  Filled 2015-10-10: qty 0.1

## 2015-10-10 SURGICAL SUPPLY — 34 items
BENZOIN TINCTURE PRP APPL 2/3 (GAUZE/BANDAGES/DRESSINGS) IMPLANT
BLADE HEX COATED 2.75 (ELECTRODE) IMPLANT
BLADE SURG 15 STRL LF DISP TIS (BLADE) IMPLANT
BLADE SURG 15 STRL SS (BLADE)
BNDG GAUZE ELAST 4 BULKY (GAUZE/BANDAGES/DRESSINGS) ×3 IMPLANT
COVER SURGICAL LIGHT HANDLE (MISCELLANEOUS) ×3 IMPLANT
DRAIN PENROSE 18X1/2 LTX STRL (DRAIN) IMPLANT
DRAIN PENROSE 18X1/4 LTX STRL (WOUND CARE) IMPLANT
DRAPE LAPAROTOMY T 98X78 PEDS (DRAPES) ×3 IMPLANT
ELECT PENCIL ROCKER SW 15FT (MISCELLANEOUS) IMPLANT
ELECT REM PT RETURN 9FT ADLT (ELECTROSURGICAL) ×3
ELECTRODE REM PT RTRN 9FT ADLT (ELECTROSURGICAL) ×1 IMPLANT
GAUZE IODOFORM PACK 1/2 7832 (GAUZE/BANDAGES/DRESSINGS) ×3 IMPLANT
GAUZE SPONGE 4X4 12PLY STRL (GAUZE/BANDAGES/DRESSINGS) IMPLANT
GLOVE BIOGEL M STRL SZ7.5 (GLOVE) ×3 IMPLANT
GOWN STRL REUS W/TWL LRG LVL3 (GOWN DISPOSABLE) ×6 IMPLANT
KIT BASIN OR (CUSTOM PROCEDURE TRAY) ×3 IMPLANT
LIQUID BAND (GAUZE/BANDAGES/DRESSINGS) IMPLANT
NEEDLE HYPO 22GX1.5 SAFETY (NEEDLE) IMPLANT
NS IRRIG 1000ML POUR BTL (IV SOLUTION) ×3 IMPLANT
PACK BASIC VI WITH GOWN DISP (CUSTOM PROCEDURE TRAY) ×3 IMPLANT
PAD ABD 8X10 STRL (GAUZE/BANDAGES/DRESSINGS) ×3 IMPLANT
SLING PERIGEE INTEPRO MESH (MISCELLANEOUS) ×3 IMPLANT
SPONGE LAP 4X18 X RAY DECT (DISPOSABLE) ×3 IMPLANT
SUCTION YANKAUER HANDLE (MISCELLANEOUS) ×3 IMPLANT
SUPPORT SCROTAL LG STRP (MISCELLANEOUS) ×2 IMPLANT
SUPPORTER ATHLETIC LG (MISCELLANEOUS) ×1
SUT VIC AB 3-0 SH 27 (SUTURE)
SUT VIC AB 3-0 SH 27XBRD (SUTURE) IMPLANT
SYR 20CC LL (SYRINGE) IMPLANT
SYR BULB IRRIGATION 50ML (SYRINGE) ×3 IMPLANT
SYR CONTROL 10ML LL (SYRINGE) IMPLANT
TOWEL OR 17X26 10 PK STRL BLUE (TOWEL DISPOSABLE) ×3 IMPLANT
WATER STERILE IRR 1500ML POUR (IV SOLUTION) IMPLANT

## 2015-10-10 NOTE — Anesthesia Preprocedure Evaluation (Addendum)
Anesthesia Evaluation  Patient identified by MRN, date of birth, ID band Patient awake    Reviewed: Allergy & Precautions, H&P , NPO status , Patient's Chart, lab work & pertinent test results  Airway Mallampati: I  TM Distance: >3 FB Neck ROM: Full    Dental no notable dental hx. (+) Teeth Intact, Dental Advisory Given   Pulmonary Current Smoker,    Pulmonary exam normal breath sounds clear to auscultation       Cardiovascular negative cardio ROS   Rhythm:Regular Rate:Normal     Neuro/Psych  Headaches, negative psych ROS   GI/Hepatic negative GI ROS, Neg liver ROS,   Endo/Other  diabetes, Type 2, Oral Hypoglycemic Agents  Renal/GU negative Renal ROS  negative genitourinary   Musculoskeletal   Abdominal   Peds  Hematology negative hematology ROS (+)   Anesthesia Other Findings   Reproductive/Obstetrics negative OB ROS                            Anesthesia Physical Anesthesia Plan  ASA: II  Anesthesia Plan: General   Post-op Pain Management:    Induction: Intravenous  Airway Management Planned: LMA  Additional Equipment:   Intra-op Plan:   Post-operative Plan: Extubation in OR  Informed Consent: I have reviewed the patients History and Physical, chart, labs and discussed the procedure including the risks, benefits and alternatives for the proposed anesthesia with the patient or authorized representative who has indicated his/her understanding and acceptance.   Dental advisory given  Plan Discussed with: CRNA  Anesthesia Plan Comments:        Anesthesia Quick Evaluation

## 2015-10-10 NOTE — H&P (Signed)
TRH H&P   Patient Demographics:    Edward [name], is a 40 y.o. male  MRN: [id number]   DOB - May 08, 1975  Admit Date - 10/10/2015  Outpatient Primary MD for the patient is Annye Asa, MD     Patient coming from: home  Chief Complaint  Patient presents with  . Abscess      HPI:    Edward [name]  is a 40 y.o. male, With history of type 2 diabetes mellitus, migraine headaches, essential hypertension who was in a good state of health until about 5 days ago when he started developing scrotal pain and in duration, for the last few days he noted that there was an area of fluctuance, yesterday he bought a new scalpel and tried to lance his abscess area and drain it himself. Subsequently he developed increased pain and tenderness and came to the ER.  The ER workups yesterday for scrotal abscess, urology has been consulted and I was requested to admit the patient. Patient denies any fever or chills or systemic symptoms, no headache, no chest or abdominal pain, no diarrhea, no blood in stool or urine or focal weakness. All other review of systems unremarkable except as above.    Review of systems:    In addition to the HPI above,   No Fever-chills, No Headache, No changes with Vision or hearing, No problems swallowing food or Liquids, No Chest pain, Cough or Shortness of Breath, No Abdominal pain, No Nausea or Vommitting, Bowel movements are regular, No Blood in stool or Urine, No dysuria, No new skin rashes or bruises, No new joints pains-aches, Except scrotal pain as above, No new weakness, tingling, numbness in any extremity, No recent weight gain or loss, No polyuria, polydypsia or polyphagia, No significant  Mental Stressors.  A full 10 point Review of Systems was done, except as stated above, all other Review of Systems were negative.   With Past History of the following :    Past Medical History:  Diagnosis Date  . Chicken pox   . Diabetes mellitus without complication ([place])   . Increased frequency of headaches       Past Surgical History:  Procedure Laterality Date  . I&D EXTREMITY Right 05/08/2015   Procedure: IRRIGATION AND DEBRIDEMENT THUMB;  Surgeon: Iran Planas, MD;  Location: [facility];  Service:  Orthopedics;  Laterality: Right;  . INCISE AND DRAIN ABCESS     left hand and jaw      Social History:     Social History  Substance Use Topics  . Smoking status: Current Every Day Smoker    Packs/day: 1.00    Years: 29.00    Types: Cigarettes  . Smokeless tobacco: Never Used  . Alcohol use No         Family History :     Family History  Problem Relation Age of Onset  . Heart disease Mother   . Diabetes Mother   . Hyperlipidemia Father   . Stroke Father   . Hypertension Father   . Lung cancer Father   . Cancer Paternal Grandfather     prostate       Home Medications:   Prior to Admission medications   Medication Sig Start Date End Date Taking? Authorizing Provider  acetaminophen (TYLENOL) 500 MG tablet Take 500 mg by mouth every 6 (six) hours as needed (for pain.).   Yes Historical Provider, MD  Blood Glucose Monitoring Suppl (TRUE METRIX AIR GLUCOSE METER) w/Device KIT 1 each by Does not apply route daily. Patient not taking: Reported on 09/29/2015 04/29/15   Philemon Kingdom, MD  cephALEXin (KEFLEX) 500 MG capsule Take 1 capsule (500 mg total) by mouth 4 (four) times daily. Patient not taking: Reported on 10/10/2015 09/30/15   Charlann Lange, PA-C  cyclobenzaprine (FLEXERIL) 5 MG tablet Take 1 tablet (5 mg total) by mouth 3 (three) times daily as needed for muscle spasms. Patient not taking: Reported on 09/29/2015 09/03/15   Leonie Douglas, PA-C  gemfibrozil  (LOPID) 600 MG tablet Take 1 tablet (600 mg total) by mouth 2 (two) times daily before a meal. Patient not taking: Reported on 09/29/2015 02/26/14   Jaynee Eagles, PA-C  glipiZIDE (GLUCOTROL) 5 MG tablet Take 1 tablet (5 mg total) by mouth 2 (two) times daily before a meal. Patient not taking: Reported on 09/29/2015 04/29/15   Philemon Kingdom, MD  glucose blood (TRUE METRIX BLOOD GLUCOSE TEST) test strip Use as instructed 2x a day Patient not taking: Reported on 09/29/2015 04/29/15   Philemon Kingdom, MD  HYDROcodone-acetaminophen (NORCO) 5-325 MG tablet Take 1 tablet by mouth every 6 (six) hours as needed. Patient not taking: Reported on 09/29/2015 09/03/15   Leonie Douglas, PA-C  meloxicam (MOBIC) 7.5 MG tablet Take 1-2 tablets (7.5-15 mg total) by mouth daily as needed for pain. Patient not taking: Reported on 09/29/2015 09/03/15   Leonie Douglas, PA-C  metFORMIN (GLUCOPHAGE) 1000 MG tablet Take 1 tablet (1,000 mg total) by mouth 2 (two) times daily with a meal. Patient not taking: Reported on 09/29/2015 02/25/14   Jaynee Eagles, PA-C  oxyCODONE-acetaminophen (PERCOCET/ROXICET) 5-325 MG tablet Take 1 tablet by mouth every 4 (four) hours as needed for severe pain. Patient not taking: Reported on 10/10/2015 09/30/15   Charlann Lange, PA-C  TRUEPLUS LANCETS 33G MISC Use 2x a day - with TrueMetrix meter Patient not taking: Reported on 09/29/2015 04/29/15   Philemon Kingdom, MD     Allergies:    No Known Allergies   Physical Exam:   Vitals  Blood pressure 106/66, pulse 77, temperature 98 F (36.7 C), temperature source Oral, resp. rate 16, height 5' 8"  (1.727 m), weight 83 kg (183 lb), SpO2 98 %.   1. General Middle-aged white male lying in hospital bed in no apparent distress,  2. Normal affect and insight, Not Suicidal  or Homicidal, Awake Alert, Oriented X 3.  3. No F.N deficits, ALL C.Nerves Intact, Strength 5/5 all 4 extremities, Sensation intact all 4 extremities, Plantars down going.  4.  Ears and Eyes appear Normal, Conjunctivae clear, PERRLA. Moist Oral Mucosa.  5. Supple Neck, No JVD, No cervical lymphadenopathy appriciated, No Carotid Bruits.  6. Symmetrical Chest wall movement, Good air movement bilaterally, CTAB.  7. RRR, No Gallops, Rubs or Murmurs, No Parasternal Heave.  8. Positive Bowel Sounds, Abdomen Soft, No tenderness, No organomegaly appriciated,No rebound -guarding or rigidity. Scrotum appears warm and erythematous, small area of about 2 cm of fluctuance and induration. No open or draining wounds.  9.  No Cyanosis, Normal Skin Turgor, No Skin Rash or Bruise. It up or tattoos all over,  10. Good muscle tone,  joints appear normal , no effusions, Normal ROM.  11. No Palpable Lymph Nodes in Neck or Axillae      Data Review:    CBC  Recent Labs Lab 10/10/15 0939  WBC 11.8*  HGB 14.8  HCT 43.1  PLT 225  MCV 94.5  MCH 32.5  MCHC 34.3  RDW 12.7  LYMPHSABS 2.0  MONOABS 0.7  EOSABS 0.1  BASOSABS 0.0   ------------------------------------------------------------------------------------------------------------------  Chemistries   Recent Labs Lab 10/10/15 0939  NA 136  K 4.1  CL 106  CO2 24  GLUCOSE 245*  BUN 13  CREATININE 0.68  CALCIUM 8.5*   ------------------------------------------------------------------------------------------------------------------ estimated creatinine clearance is 128.8 mL/min (by C-G formula based on SCr of 0.8 mg/dL). ------------------------------------------------------------------------------------------------------------------ No results for input(s): TSH, T4TOTAL, T3FREE, THYROIDAB in the last 72 hours.  Invalid input(s): FREET3  Coagulation profile No results for input(s): INR, PROTIME in the last 168 hours. ------------------------------------------------------------------------------------------------------------------- No results for input(s): DDIMER in the last 72  hours. -------------------------------------------------------------------------------------------------------------------  Cardiac Enzymes No results for input(s): CKMB, TROPONINI, MYOGLOBIN in the last 168 hours.  Invalid input(s): CK ------------------------------------------------------------------------------------------------------------------ No results found for: BNP   ---------------------------------------------------------------------------------------------------------------  Urinalysis    Component Value Date/Time   COLORURINE RED (A) 09/28/2014 0524   APPEARANCEUR TURBID (A) 09/28/2014 0524   LABSPEC 1.026 09/28/2014 0524   PHURINE 5.5 09/28/2014 0524   GLUCOSEU >1000 (A) 09/28/2014 0524   HGBUR LARGE (A) 09/28/2014 0524   BILIRUBINUR NEGATIVE 09/28/2014 0524   KETONESUR NEGATIVE 09/28/2014 0524   PROTEINUR 100 (A) 09/28/2014 0524   UROBILINOGEN 0.2 09/28/2014 0524   NITRITE NEGATIVE 09/28/2014 0524   LEUKOCYTESUR MODERATE (A) 09/28/2014 0524    ----------------------------------------------------------------------------------------------------------------   Imaging Results:    Ct Pelvis W Contrast  Result Date: 10/10/2015 CLINICAL DATA:  Genital abscess for 4 days with recent lancing and increased pain, initial encounter EXAM: CT PELVIS WITH CONTRAST TECHNIQUE: Multidetector CT imaging of the pelvis was performed using the standard protocol following the bolus administration of intravenous contrast. CONTRAST:  196m ISOVUE-300 IOPAMIDOL (ISOVUE-300) INJECTION 61% COMPARISON:  None. FINDINGS: Along the posterior aspect of the scrotum in the midline there is a 1.8 x 1.1 x 1.0 cm fluid attenuation area with peripheral enhancement consistent with the given clinical history of abscess. The testicles of venous are within normal limits. Some mild hyperemia within the scrotum is noted. Multiple inguinal lymph nodes are noted and likely reactive in nature. The bladder is  well distended. The large and small bowel as visualized are within normal limits. The appendix is within normal limits. No acute bony abnormality is seen. IMPRESSION: Changes consistent with an abscess along the posterior aspect of the scrotum. Reactive lymphadenopathy is noted  in the inguinal regions bilaterally. Electronically Signed   By: Inez Catalina M.D.   On: 10/10/2015 12:07    Preop baseline chest x-ray and EKG ordered.   Assessment & Plan:      1. Scrotal abscess in a patient with diabetes mellitus type 2. CT scan rules out for his gangrene, blood cultures drawn, placed on clindamycin and Unasyn, urology has been consulted will be going to OR for incision and drainage shortly. Will request surgeon to obtain cultures from the wound. He is not septic.  2. DM type II. During the perioperative period we will keep him on glucose stabilize her until the next 36-48 hours.  3. Dyslipidemia. Continue Lopid.  4. History of smoking. Counseled to quit.   DVT Prophylaxis Heparin    AM Labs Ordered, also please review Full Orders  Family Communication: Admission, patients condition and plan of care including tests being ordered have been discussed with the patient  who indicates understanding and agree with the plan and Code Status.  Code Status Full  Likely DC to Home 2-3 days  Condition Fair  Consults called: Urology    Admission status: Inpt    Time spent in minutes : 35   Lala Lund K M.D on 10/10/2015 at 2:26 PM  Between 7am to 7pm - Pager - [phone]. After 7pm go to [email or web address] - password [facility]  [facility] - Office  [phone]

## 2015-10-10 NOTE — ED Notes (Signed)
Alcario DroughtErica, RN stated floor is not ready to accept patients.

## 2015-10-10 NOTE — Anesthesia Postprocedure Evaluation (Signed)
Anesthesia Post Note  Patient: Edward [name]  Procedure(s) Performed: Procedure(s) (LRB): IRRIGATION AND DEBRIDEMENT SCROTAL ABSCESS (N/A)  Patient location during evaluation: PACU Anesthesia Type: General Level of consciousness: awake and alert Pain management: pain level controlled Vital Signs Assessment: post-procedure vital signs reviewed and stable Respiratory status: spontaneous breathing, nonlabored ventilation and respiratory function stable Cardiovascular status: blood pressure returned to baseline and stable Postop Assessment: no signs of nausea or vomiting Anesthetic complications: no    Last Vitals:  Vitals:   10/10/15 1900 10/10/15 1915  BP:    Pulse: 81   Resp: 15   Temp:  36.6 C    Last Pain:  Vitals:   10/10/15 1915  TempSrc:   PainSc: 0-No pain                 Reyanne Hussar,W. EDMOND

## 2015-10-10 NOTE — ED Notes (Signed)
Contacted OR about the insulin drip and was told patient is in surgery, someone will take care of it once patient is out of surgery.

## 2015-10-10 NOTE — Transfer of Care (Signed)
Immediate Anesthesia Transfer of Care Note  Patient: Westley Hummerharles B Maye  Procedure(s) Performed: Procedure(s): IRRIGATION AND DEBRIDEMENT SCROTAL ABSCESS (N/A)  Patient Location: PACU  Anesthesia Type:General  Level of Consciousness: awake  Airway & Oxygen Therapy: Patient Spontanous Breathing and Patient connected to face mask oxygen  Post-op Assessment: Report given to RN and Post -op Vital signs reviewed and stable  Post vital signs: Reviewed and stable  Last Vitals:  Vitals:   10/10/15 1646 10/10/15 1714  BP: 108/75 131/94  Pulse: 81 77  Resp: 18   Temp: 36.7 C     Last Pain:  Vitals:   10/10/15 1719  TempSrc:   PainSc: 7          Complications: No apparent anesthesia complications

## 2015-10-10 NOTE — Consult Note (Signed)
Reason for Consult: Scrotal Abscess  Referring Physician: Dr. Zenia Resides with [facility]  Edward [name] is an 40 y.o. male.   HPI:   1 - Scrotal Abscess - 4 days of progressive inferior scrotal pain and swelling c/w abscess as eh has had on other body parts. Tried to I+D at home but quickly recurred. No fevers but some malaise. ARea approx 5cm. CT pelvis w/o subQ gas. WBC 11k.   PMH sig for jaw and hand abscesses, DM2 (not on meds, states random CBG 200s).   Past Medical History:  Diagnosis Date  . Chicken pox   . Diabetes mellitus without complication ([place])   . Increased frequency of headaches     Past Surgical History:  Procedure Laterality Date  . I&D EXTREMITY Right 05/08/2015   Procedure: IRRIGATION AND DEBRIDEMENT THUMB;  Surgeon: Iran Planas, MD;  Location: [facility];  Service: Orthopedics;  Laterality: Right;  . INCISE AND DRAIN ABCESS     left hand and jaw    Family History  Problem Relation Age of Onset  . Heart disease Mother   . Diabetes Mother   . Hyperlipidemia Father   . Stroke Father   . Hypertension Father   . Lung cancer Father   . Cancer Paternal Grandfather     prostate    Social History:  reports that he has been smoking Cigarettes.  He has a 29.00 pack-year smoking history. He has never used smokeless tobacco. He reports that he does not drink alcohol or use drugs.  Allergies: No Known Allergies  Medications: I have reviewed the patient's current medications.  Results for orders placed or performed during the hospital encounter of 10/10/15 (from the past 48 hour(s))  Basic metabolic panel     Status: Abnormal   Collection Time: 10/10/15  9:39 AM  Result Value Ref Range   Sodium 136 135 - 145 mmol/L   Potassium 4.1 3.5 - 5.1 mmol/L   Chloride 106 101 - 111 mmol/L   CO2 24 22 - 32 mmol/L   Glucose, Bld 245 (H) 65 - 99 mg/dL   BUN 13 6 - 20 mg/dL   Creatinine, Ser 0.68 0.61 - 1.24 mg/dL   Calcium 8.5 (L) 8.9 - 10.3 mg/dL   GFR calc non Af Amer >60 >60  mL/min   GFR calc Af Amer >60 >60 mL/min    Comment: (NOTE) The eGFR has been calculated using the CKD EPI equation. This calculation has not been validated in all clinical situations. eGFR's persistently <60 mL/min signify possible Chronic Kidney Disease.    Anion gap 6 5 - 15  CBC with Differential     Status: Abnormal   Collection Time: 10/10/15  9:39 AM  Result Value Ref Range   WBC 11.8 (H) 4.0 - 10.5 K/uL   RBC 4.56 4.22 - 5.81 MIL/uL   Hemoglobin 14.8 13.0 - 17.0 g/dL   HCT 43.1 39.0 - 52.0 %   MCV 94.5 78.0 - 100.0 fL   MCH 32.5 26.0 - 34.0 pg   MCHC 34.3 30.0 - 36.0 g/dL   RDW 12.7 11.5 - 15.5 %   Platelets 225 150 - 400 K/uL   Neutrophils Relative % 76 %   Neutro Abs 9.0 (H) 1.7 - 7.7 K/uL   Lymphocytes Relative 17 %   Lymphs Abs 2.0 0.7 - 4.0 K/uL   Monocytes Relative 6 %   Monocytes Absolute 0.7 0.1 - 1.0 K/uL   Eosinophils Relative 1 %   Eosinophils  Absolute 0.1 0.0 - 0.7 K/uL   Basophils Relative 0 %   Basophils Absolute 0.0 0.0 - 0.1 K/uL    Ct Pelvis W Contrast  Result Date: 10/10/2015 CLINICAL DATA:  Genital abscess for 4 days with recent lancing and increased pain, initial encounter EXAM: CT PELVIS WITH CONTRAST TECHNIQUE: Multidetector CT imaging of the pelvis was performed using the standard protocol following the bolus administration of intravenous contrast. CONTRAST:  111m ISOVUE-300 IOPAMIDOL (ISOVUE-300) INJECTION 61% COMPARISON:  None. FINDINGS: Along the posterior aspect of the scrotum in the midline there is a 1.8 x 1.1 x 1.0 cm fluid attenuation area with peripheral enhancement consistent with the given clinical history of abscess. The testicles of venous are within normal limits. Some mild hyperemia within the scrotum is noted. Multiple inguinal lymph nodes are noted and likely reactive in nature. The bladder is well distended. The large and small bowel as visualized are within normal limits. The appendix is within normal limits. No acute bony  abnormality is seen. IMPRESSION: Changes consistent with an abscess along the posterior aspect of the scrotum. Reactive lymphadenopathy is noted in the inguinal regions bilaterally. Electronically Signed   By: MInez CatalinaM.D.   On: 10/10/2015 12:07    Review of Systems  Constitutional: Positive for malaise/fatigue. Negative for chills and fever.  HENT: Negative.   Eyes: Negative.   Respiratory: Negative.   Cardiovascular: Negative.   Gastrointestinal: Negative.   Genitourinary: Negative.   Musculoskeletal: Negative.   Skin: Negative.   Neurological: Negative.   Endo/Heme/Allergies: Negative.   Psychiatric/Behavioral: Negative.    Blood pressure 106/66, pulse 77, temperature 98 F (36.7 C), temperature source Oral, resp. rate 16, SpO2 98 %. Physical Exam  Constitutional: He appears well-developed.  HENT:  Head: Normocephalic.  Eyes: Pupils are equal, round, and reactive to light.  Neck: Normal range of motion.  Cardiovascular: Normal rate.   Respiratory: Effort normal.  GI: Soft.  Genitourinary:  Genitourinary Comments: 5cm area of induration inferior scrotum that is very painful, some fluctuence noted. NO crepitus or obvious communication to thighs or abd wall.   Musculoskeletal: Normal range of motion.  Skin: Skin is warm.  UE tattoos noted.   Psychiatric: He has a normal mood and affect. His behavior is normal.    Assessment/Plan:  1 - Scrotal Abscess - discussed options of meds only v. combination with operative I+D with goal of more accurate diagnosis, faster healing with packing, and to allow more accurate culturing likely source and he wants to proceed with I+D.   Risks, benefits, alternatives, expected peri-op course discussed as well as likely need to pack wound QD or QOD. Agree with empiric clinda awaiting further CX data.   Had candy bar aground [place] Machael Raine 10/10/2015, 1:31 PM

## 2015-10-10 NOTE — ED Notes (Signed)
Report given to Walbridge[place], RN

## 2015-10-10 NOTE — ED Triage Notes (Signed)
Pt reports right genital abscess onset 4 days ago; pt reports self lance last night; increased pain and swelling since.

## 2015-10-10 NOTE — ED Notes (Signed)
Waiting for charge nurse to call once room is assigned

## 2015-10-10 NOTE — ED Notes (Signed)
Per OR, they are aware patient is on insulin drip but stated they will correct insulin units once patient is out of surgery.

## 2015-10-10 NOTE — ED Notes (Signed)
Per OR, don't hang the UNASYN they will give it to them.

## 2015-10-10 NOTE — Anesthesia Procedure Notes (Signed)
Procedure Name: LMA Insertion Date/Time: 10/10/2015 6:03 PM Performed by: Leroy LibmanEARDON, Jermya Dowding L Pre-anesthesia Checklist: Patient identified Patient Re-evaluated:Patient Re-evaluated prior to inductionOxygen Delivery Method: Circle system utilized Preoxygenation: Pre-oxygenation with 100% oxygen Intubation Type: IV induction Ventilation: Mask ventilation without difficulty LMA: LMA inserted LMA Size: 4.0 Number of attempts: 1 Placement Confirmation: positive ETCO2 and breath sounds checked- equal and bilateral Tube secured with: Tape Dental Injury: Teeth and Oropharynx as per pre-operative assessment

## 2015-10-10 NOTE — Brief Op Note (Signed)
10/10/2015  6:19 PM  PATIENT:  Westley Hummerharles B Omara  40 y.o. male  PRE-OPERATIVE DIAGNOSIS:  scrotal abcess  POST-OPERATIVE DIAGNOSIS:  scrotal abcess  PROCEDURE:  Procedure(s): IRRIGATION AND DEBRIDEMENT SCROTAL ABSCESS (N/A)  SURGEON:  Surgeon(s) and Role:    * Sebastian Acheheodore Tyishia Aune, MD - Primary  PHYSICIAN ASSISTANT:   ASSISTANTS: none   ANESTHESIA:   general  EBL:  No intake/output data recorded.  BLOOD ADMINISTERED:none  DRAINS: iodoform gauze packing   LOCAL MEDICATIONS USED:  NONE  SPECIMEN:  Source of Specimen:  scrotal abscess fluid  DISPOSITION OF SPECIMEN:  micriobiology for gram stain and CX  COUNTS:  YES  TOURNIQUET:  * No tourniquets in log *  DICTATION: .Other Dictation: Dictation Number [id number]  PLAN OF CARE: Admit to inpatient   PATIENT DISPOSITION:  PACU - hemodynamically stable.   Delay start of Pharmacological VTE agent (>24hrs) due to surgical blood loss or risk of bleeding: not applicable

## 2015-10-10 NOTE — Progress Notes (Signed)
Pharmacy Antibiotic Note  Edward [name] is a 40 y.o. male admitted on 10/10/2015 with scrotal abscess.  Pharmacy has been consulted for Unasyn and Clindamycin dosing. MD wants to cover MRSA as well as gram negatives and anaerobes. Clinda 900 mg x 1 given preop; to undergo I&D  Plan: Clindamycin 600 mg IV q8 hr Unasyn 1.5 g IV q6 hr The combination Rocephin/Clindamycin will provide empiric coverage for MRSA, anaerobes, and gram negative pathogens without duplicate anaerobic coverage.  Consider narrowing regimen as appropriate     Temp (24hrs), Avg:98.3 F (36.8 C), Min:98 F (36.7 C), Max:98.6 F (37 C)   Recent Labs Lab 10/10/15 0939  WBC 11.8*  CREATININE 0.68    CrCl cannot be calculated (Unknown ideal weight.).    No Known Allergies  Antimicrobials this admission: Clindamycin  8/11 >>  Unasyn 8/11 >>    Dose adjustments this admission: ---  Microbiology results: 8/11 BCx: sent Anticipate abscess Cx from I&D  Thank you for allowing pharmacy to be a part of this patient's care.  Bernadene Personrew Gwenlyn Hottinger, PharmD, BCPS Pager: [phone] 10/10/2015, 3:02 PM

## 2015-10-11 LAB — HEMOGLOBIN A1C
Hgb A1c MFr Bld: 9.6 % — ABNORMAL HIGH (ref 4.8–5.6)
Mean Plasma Glucose: 229 mg/dL

## 2015-10-11 LAB — BASIC METABOLIC PANEL
Anion gap: 5 (ref 5–15)
BUN: 9 mg/dL (ref 6–20)
CALCIUM: 8.6 mg/dL — AB (ref 8.9–10.3)
CO2: 28 mmol/L (ref 22–32)
CREATININE: 0.73 mg/dL (ref 0.61–1.24)
Chloride: 105 mmol/L (ref 101–111)
GFR calc Af Amer: >60 mL/min (ref >60)
Glucose, Bld: 108 mg/dL — ABNORMAL HIGH (ref 65–99)
POTASSIUM: 4.2 mmol/L (ref 3.5–5.1)
SODIUM: 138 mmol/L (ref 135–145)

## 2015-10-11 LAB — CBC
HEMATOCRIT: 44.6 % (ref 39.0–52.0)
Hemoglobin: 14.8 g/dL (ref 13.0–17.0)
MCH: 31.8 pg (ref 26.0–34.0)
MCHC: 33.2 g/dL (ref 30.0–36.0)
MCV: 95.9 fL (ref 78.0–100.0)
PLATELETS: 231 10*3/uL (ref 150–400)
RBC: 4.65 MIL/uL (ref 4.22–5.81)
RDW: 12.7 % (ref 11.5–15.5)
WBC: 10.2 10*3/uL (ref 4.0–10.5)

## 2015-10-11 LAB — GLUCOSE, CAPILLARY
GLUCOSE-CAPILLARY: 164 mg/dL — AB (ref 65–99)
GLUCOSE-CAPILLARY: 277 mg/dL — AB (ref 65–99)
Glucose-Capillary: 179 mg/dL — ABNORMAL HIGH (ref 65–99)
Glucose-Capillary: 227 mg/dL — ABNORMAL HIGH (ref 65–99)
Glucose-Capillary: 176 mg/dL — ABNORMAL HIGH (ref 65–99)

## 2015-10-11 LAB — MAGNESIUM: MAGNESIUM: 1.8 mg/dL (ref 1.7–2.4)

## 2015-10-11 MED ORDER — SODIUM CHLORIDE 0.9 % IV SOLN
INTRAVENOUS | Status: AC
  Administered 2015-10-11 – 2015-10-12 (×2): via INTRAVENOUS

## 2015-10-11 MED ORDER — INSULIN ASPART 100 UNIT/ML ~~LOC~~ SOLN
0.0000 [IU] | Freq: Three times a day (TID) | SUBCUTANEOUS | Status: DC
  Administered 2015-10-11: 2 [IU] via SUBCUTANEOUS
  Administered 2015-10-11 – 2015-10-12 (×2): 3 [IU] via SUBCUTANEOUS

## 2015-10-11 MED ORDER — NICOTINE POLACRILEX 2 MG MT GUM
2.0000 mg | CHEWING_GUM | OROMUCOSAL | Status: DC | PRN
  Filled 2015-10-11: qty 1

## 2015-10-11 MED ORDER — INSULIN ASPART 100 UNIT/ML ~~LOC~~ SOLN
4.0000 [IU] | Freq: Three times a day (TID) | SUBCUTANEOUS | Status: DC
  Administered 2015-10-11 – 2015-10-12 (×3): 4 [IU] via SUBCUTANEOUS

## 2015-10-11 MED ORDER — INSULIN ASPART 100 UNIT/ML ~~LOC~~ SOLN: 0.0000 [IU] | Freq: Every day | SUBCUTANEOUS | Status: DC

## 2015-10-11 MED ORDER — FLUCONAZOLE 100 MG PO TABS
150.0000 mg | ORAL_TABLET | Freq: Once | ORAL | Status: AC
  Administered 2015-10-11: 150 mg via ORAL
  Filled 2015-10-11: qty 2

## 2015-10-11 NOTE — Progress Notes (Signed)
PROGRESS NOTE                                                                                                                                                                                                             Patient Demographics:    Edward [name], is a 40 y.o. male, DOB - [birth year], ZOX:[id number]  Admit date - 10/10/2015   Admitting Physician Leroy Sea, MD  Outpatient Primary MD for the patient is [name], MD  LOS - 1  Chief Complaint  Patient presents with  . Abscess       Brief Narrative     Edward [name]  is a 40 y.o. male, With history of type 2 diabetes mellitus, migraine headaches, essential hypertension who was in a good state of health until about 5 days ago when he started developing scrotal pain and in duration, for the last few days he noted that there was an area of fluctuance, yesterday he bought a new scalpel and tried to lance his abscess area and drain it himself. Subsequently he developed increased pain and tenderness and came to the ER.  The ER workups yesterday for scrotal abscess, urology has been consulted and I was requested to admit the patient. Patient denies any fever or chills or systemic symptoms, no headache, no chest or abdominal pain, no diarrhea, no blood in stool or urine or focal weakness. All other review of systems unremarkable except as above.     Subjective:    Edward [name] today has, No headache, No chest pain, No abdominal pain - No Nausea, No new weakness tingling or numbness, No Cough - SOB. Mild intermittent scrotal pain.   Assessment  & Plan :   1. Scrotal abscess in a patient with diabetes mellitus type 2. CT scan rules out for his gangrene, blood cultures drawn, placed on clindamycin and Unasyn. He was not septic. Urology following he is post I&D of Scrotal abscess by urologist Dr. Berneice Heinrich on 10/10/2015, continue wound care and IV ABX, follow wound  cultures, pain control.   2. DM type II. Was on glucose stabilizer for tight glycemic control but was transitioned to sliding scale with Levemir on 10/10/2015 night, will monitor CBGs closely if hard to control will put him back on glucose stabilizer. We will add pre-meal NovoLog for now.  Lab Results  Component  Value Date   HGBA1C 9.6 (H) 10/10/2015   CBG (last 3)   Recent Labs  10/10/15 1958 10/11/15 0023 10/11/15 0804  GLUCAP 121* 277* 164*     3. Dyslipidemia. Continue Lopid.  4. History of smoking. Counseled to quit.    Family Communication  :  wife  Code Status :  Full  Diet : Heart healthy, modified  Disposition Plan  :  Likely home tomorrow  Consults  :  Urology  Procedures  :    I&D of Scrotal abscess by urologist Dr. Berneice Heinrich on 10/10/2015  DVT Prophylaxis  :   Heparin    Lab Results  Component Value Date   PLT 231 10/11/2015    Inpatient Medications  Scheduled Meds: . ampicillin-sulbactam (UNASYN) IV  1.5 g Intravenous Q6H  . clindamycin (CLEOCIN) IV  600 mg Intravenous Q8H  . gemfibrozil  600 mg Oral BID AC  . heparin  5,000 Units Subcutaneous Q8H  . insulin aspart  0-24 Units Subcutaneous TID WC  . insulin detemir  20 Units Subcutaneous Daily   Continuous Infusions: . sodium chloride 50 mL/hr at 10/11/15 0646   PRN Meds:.dextrose, HYDROcodone-acetaminophen, morphine injection, nicotine polacrilex, ondansetron **OR** ondansetron (ZOFRAN) IV, senna-docusate  Antibiotics  :    Anti-infectives    Start     Dose/Rate Route Frequency Ordered Stop   10/11/15 0045  fluconazole (DIFLUCAN) tablet 150 mg     150 mg Oral  Once 10/11/15 0031 10/11/15 0053   10/10/15 2200  clindamycin (CLEOCIN) IVPB 600 mg     600 mg 100 mL/hr over 30 Minutes Intravenous Every 8 hours 10/10/15 1519     10/10/15 1600  ampicillin-sulbactam (UNASYN) 1.5 g in sodium chloride 0.9 % 50 mL IVPB     1.5 g 100 mL/hr over 30 Minutes Intravenous Every 6 hours 10/10/15 1519      10/10/15 1045  clindamycin (CLEOCIN) IVPB 900 mg     900 mg 100 mL/hr over 30 Minutes Intravenous  Once 10/10/15 1032 10/10/15 1215         Objective:   Vitals:   10/10/15 1900 10/10/15 1915 10/10/15 1941 10/11/15 0546  BP:   125/82 (!) 111/59  Pulse: 81  77 77  Resp: 15  20 20   Temp:  97.8 F (36.6 C) 99 F (37.2 C) 99.1 F (37.3 C)  TempSrc:   Axillary Oral  SpO2: 100%  99% 95%  Weight:    84.6 kg (186 lb 6.4 oz)  Height:        Wt Readings from Last 3 Encounters:  10/11/15 84.6 kg (186 lb 6.4 oz)  09/03/15 83.6 kg (184 lb 6.4 oz)  05/08/15 86.6 kg (191 lb)     Intake/Output Summary (Last 24 hours) at 10/11/15 0819 Last data filed at 10/11/15 0551  Gross per 24 hour  Intake          1353.88 ml  Output             1000 ml  Net           353.88 ml     Physical Exam  Awake Alert, Oriented X 3, No new F.N deficits, Normal affect [name],PERRAL Supple Neck,No JVD, No cervical lymphadenopathy appriciated.  Symmetrical Chest wall movement, Good air movement bilaterally, CTAB RRR,No Gallops,Rubs or new Murmurs, No Parasternal Heave +ve B.Sounds, Abd Soft, No tenderness, No organomegaly appriciated, No rebound - guarding or rigidity. No Cyanosis, Clubbing or edema, No new Rash or bruise  Scrotum in bandage     Data Review:    CBC  Recent Labs Lab 10/10/15 0939 10/11/15 0516  WBC 11.8* 10.2  HGB 14.8 14.8  HCT 43.1 44.6  PLT 225 231  MCV 94.5 95.9  MCH 32.5 31.8  MCHC 34.3 33.2  RDW 12.7 12.7  LYMPHSABS 2.0  --   MONOABS 0.7  --   EOSABS 0.1  --   BASOSABS 0.0  --     Chemistries   Recent Labs Lab 10/10/15 0939 10/11/15 0516  NA 136 138  K 4.1 4.2  CL 106 105  CO2 24 28  GLUCOSE 245* 108*  BUN 13 9  CREATININE 0.68 0.73  CALCIUM 8.5* 8.6*  MG  --  1.8   ------------------------------------------------------------------------------------------------------------------ No results for input(s): CHOL, HDL, LDLCALC, TRIG, CHOLHDL,  LDLDIRECT in the last 72 hours.  Lab Results  Component Value Date   HGBA1C 9.6 (H) 10/10/2015   ------------------------------------------------------------------------------------------------------------------ No results for input(s): TSH, T4TOTAL, T3FREE, THYROIDAB in the last 72 hours.  Invalid input(s): FREET3 ------------------------------------------------------------------------------------------------------------------ No results for input(s): VITAMINB12, FOLATE, FERRITIN, TIBC, IRON, RETICCTPCT in the last 72 hours.  Coagulation profile  Recent Labs Lab 10/10/15 1356  INR 1.03    No results for input(s): DDIMER in the last 72 hours.  Cardiac Enzymes No results for input(s): CKMB, TROPONINI, MYOGLOBIN in the last 168 hours.  Invalid input(s): CK ------------------------------------------------------------------------------------------------------------------ No results found for: BNP  Micro Results Recent Results (from the past 240 hour(s))  Aerobic/Anaerobic Culture (surgical/deep wound)     Status: None (Preliminary result)   Collection Time: 10/10/15  6:15 PM  Result Value Ref Range Status   Specimen Description ABSCESS  Final   Special Requests NONE  Final   Gram Stain   Final    ABUNDANT WBC PRESENT, PREDOMINANTLY PMN FEW GRAM POSITIVE COCCI IN PAIRS [facility]    Culture PENDING  Incomplete   Report Status PENDING  Incomplete    Radiology Reports Ct Pelvis W Contrast  Result Date: 10/10/2015 CLINICAL DATA:  Genital abscess for 4 days with recent lancing and increased pain, initial encounter EXAM: CT PELVIS WITH CONTRAST TECHNIQUE: Multidetector CT imaging of the pelvis was performed using the standard protocol following the bolus administration of intravenous contrast. CONTRAST:  100mL ISOVUE-300 IOPAMIDOL (ISOVUE-300) INJECTION 61% COMPARISON:  None. FINDINGS: Along the posterior aspect of the scrotum in the midline there is a 1.8  x 1.1 x 1.0 cm fluid attenuation area with peripheral enhancement consistent with the given clinical history of abscess. The testicles of venous are within normal limits. Some mild hyperemia within the scrotum is noted. Multiple inguinal lymph nodes are noted and likely reactive in nature. The bladder is well distended. The large and small bowel as visualized are within normal limits. The appendix is within normal limits. No acute bony abnormality is seen. IMPRESSION: Changes consistent with an abscess along the posterior aspect of the scrotum. Reactive lymphadenopathy is noted in the inguinal regions bilaterally. Electronically Signed   By: Alcide CleverMark  Lukens M.D.   On: 10/10/2015 12:07   Dg Chest Port 1 View  Result Date: 10/10/2015 CLINICAL DATA:  Pre op - Scrotal Abscess - 4 days of progressive inferior scrotal pain and swelling c/w abscess as eh has had on other body parts. Tried to I+D at home but quickly recurred. No fevers but some malaise. ARea approx 5cm. CT pelvis w/o subQ gas. WBC 11k. Smoker - pt states no other chest hx EXAM: PORTABLE  CHEST 1 VIEW COMPARISON:  None. FINDINGS: The heart size and mediastinal contours are within normal limits. Both lungs are clear. No pleural effusion or pneumothorax. The visualized skeletal structures are unremarkable. IMPRESSION: No active disease. Electronically Signed   By: Amie Portland M.D.   On: 10/10/2015 14:55    Time Spent in minutes  30   SINGH,PRASHANT K M.D on 10/11/2015 at 8:19 AM  Between 7am to 7pm - Pager - [phone]  After 7pm go to [email or web address] - password [facility]  [facility] -  Office  [phone]

## 2015-10-11 NOTE — Op Note (Signed)
NAMMarchia Bond:  [name], Edward                ACCOUNT NO.:  [id number]  MEDICAL RECORD NO.:  [id number]  LOCATION:  1443                         [facility]  PHYSICIAN:  Sebastian Acheheodore Kassiah Mccrory, MD     DATE OF BIRTH:  08/05/1975  DATE OF PROCEDURE: 10/10/2015                              OPERATIVE REPORT   PREOPERATIVE DIAGNOSIS:  Scrotal abscess.  PROCEDURE:  Incision and drainage, scrotal abscess.  ESTIMATED BLOOD LOSS:  Nil.  COMPLICATION:  None.  SPECIMEN:  Abscess fluid for Gram stain and culture.  INDICATION:  Edward [name] is a 40 year old gentleman with history of recurrent soft tissue infections who was found on workup of scrotal pain to have a scrotal abscess.  He said this had been progressed for several days.  He underwent attempted drainage at home by pumping; however, it soon returned and has been more bothersome.  He underwent axial imaging of the pelvis which revealed a fluid collection that appeared to be subcutaneous only without any subcu gas.  Exam revealed findings most consistent with likely scrotal abscess without deep involvement or any sort of necrotizing infection.  Options were discussed including medical therapy alone versus medical therapy with incision and drainage both at the bedside and operatively wished to proceed with the latter.  Informed consent was obtained and placed in the medical record.  PROCEDURE IN DETAIL:  The patient being Edward [name], was verified. Procedure being scrotal abscess incision and drainage was confirmed. Procedure was carried out.  Time-out was performed.  Intravenous antibiotics were administered.  General anesthesia was introduced.  The patient was placed into a supine position and a sterile field was created by first clipper shaving and prepping and draping the patient's inferior scrotum using iodinated prep.  Next, a small incision was made approximately 1 inch in length on the area of maximal induration and there was immediate  efflux of purulent fluid.  Representative sample was swabbed and satisfied for Gram stain and culture.  Using a small hemostat, several small septations were broken in the abscess completely drained and then irrigated with approximately 100 mL normal saline to clear.  Additional hemostasis was achieved with point coagulation current with cautery, and the area was packed, approximately 4 inches of 0.5-inch iodoform gauze held in place by a single nylon stitch. Following these maneuvers, hemostasis appeared excellent.  All sponge and needle counts were correct.  We achieved the goals of procedure today.  The dressing of dry gauze and mesh panties were applied and procedure terminated.  The patient tolerated the procedure well.  There were no immediate periprocedural complications.  The patient was taken to the postanesthesia care unit in stable condition.          ______________________________ Sebastian Acheheodore Erielle Gawronski, MD     TM/MEDQ  D:  10/10/2015  T:  10/10/2015  Job:  [id number]

## 2015-10-11 NOTE — Progress Notes (Signed)
1 Day Post-Op  Subjective:  1 - Scrotal Abscess -  S/p I+D scrotal abscess 10/10/15 for progressive localized soft tissue infection. Initial CT w/o subQ gas. WCX GPC's / pending. Not on emperic Clinda + Unasyn.    Today "Aggie Hacker" is improving. Still some episodic low grade fevers and some resolving scrotal pain as expected.    Objective: Vital signs in last 24 hours: Temp:  [97.8 F (36.6 C)-99.1 F (37.3 C)] 99.1 F (37.3 C) (08/12 0546) Pulse Rate:  [75-91] 77 (08/12 0546) Resp:  [15-22] 20 (08/12 0546) BP: (102-131)/(59-94) 111/59 (08/12 0546) SpO2:  [95 %-100 %] 95 % (08/12 0546) Weight:  [83 kg (183 lb)-84.6 kg (186 lb 6.4 oz)] 84.6 kg (186 lb 6.4 oz) (08/12 0546)    Intake/Output from previous day: 08/11 0701 - 08/12 0700 In: 1353.9 [P.O.:462; I.V.:791.9; IV Piggyback:100] Out: 1000 [Urine:975; Blood:25] Intake/Output this shift: No intake/output data recorded.  General appearance: alert, cooperative, appears stated age and wife at bedside Eyes: negative Nose: Nares normal. Septum midline. Mucosa normal. No drainage or sinus tenderness. Throat: lips, mucosa, and tongue normal; teeth and gums normal Neck: supple, symmetrical, trachea midline Back: symmetric, no curvature. ROM normal. No CVA tenderness. Resp: non-labored on room air Cardio: regular rate and rhythm, S1, S2 normal, no murmur, click, rub or gallop Male genitalia: inferior abscess area wtih packing in place. Stable / slightly improved induration. No fluctuence or progression.  Extremities: extremities normal, atraumatic, no cyanosis or edema Skin: Skin color, texture, turgor normal. No rashes or lesions Lymph nodes: Cervical, supraclavicular, and axillary nodes normal. Neurologic: Grossly normal  Lab Results:   Recent Labs  10/10/15 0939 10/11/15 0516  WBC 11.8* 10.2  HGB 14.8 14.8  HCT 43.1 44.6  PLT 225 231   BMET  Recent Labs  10/10/15 0939 10/11/15 0516  NA 136 138  K 4.1 4.2  CL 106 105   CO2 24 28  GLUCOSE 245* 108*  BUN 13 9  CREATININE 0.68 0.73  CALCIUM 8.5* 8.6*   PT/INR  Recent Labs  10/10/15 1356  LABPROT 13.5  INR 1.03   ABG No results for input(s): PHART, HCO3 in the last 72 hours.  Invalid input(s): PCO2, PO2  Studies/Results: Ct Pelvis W Contrast  Result Date: 10/10/2015 CLINICAL DATA:  Genital abscess for 4 days with recent lancing and increased pain, initial encounter EXAM: CT PELVIS WITH CONTRAST TECHNIQUE: Multidetector CT imaging of the pelvis was performed using the standard protocol following the bolus administration of intravenous contrast. CONTRAST:  ISOVUE-300 IOPAMIDOL (ISOVUE-300) INJECTION 61% COMPARISON:  None. FINDINGS: Along the posterior aspect of the scrotum in the midline there is a 1.8 x 1.1 x 1.0 cm fluid attenuation area with peripheral enhancement consistent with the given clinical history of abscess. The testicles of venous are within normal limits. Some mild hyperemia within the scrotum is noted. Multiple inguinal lymph nodes are noted and likely reactive in nature. The bladder is well distended. The large and small bowel as visualized are within normal limits. The appendix is within normal limits. No acute bony abnormality is seen. IMPRESSION: Changes consistent with an abscess along the posterior aspect of the scrotum. Reactive lymphadenopathy is noted in the inguinal regions bilaterally. Electronically Signed   By: Alcide Clever M.D.   On: 10/10/2015 12:07   Dg Chest Port 1 View  Result Date: 10/10/2015 CLINICAL DATA:  Pre op - Scrotal Abscess - 4 days of progressive inferior scrotal pain and swelling c/w abscess as  eh has had on other body parts. Tried to I+D at home but quickly recurred. No fevers but some malaise. ARea approx 5cm. CT pelvis w/o subQ gas. WBC 11k. Smoker - pt states no other chest hx EXAM: PORTABLE CHEST 1 VIEW COMPARISON:  None. FINDINGS: The heart size and mediastinal contours are within normal limits. Both  lungs are clear. No pleural effusion or pneumothorax. The visualized skeletal structures are unremarkable. IMPRESSION: No active disease. Electronically Signed   By: Amie Portlandavid  Ormond M.D.   On: 10/10/2015 14:55    Anti-infectives: Anti-infectives    Start     Dose/Rate Route Frequency Ordered Stop   10/11/15 0045  fluconazole (DIFLUCAN) tablet 150 mg     150 mg Oral  Once 10/11/15 0031 10/11/15 0053   10/10/15 2200  clindamycin (CLEOCIN) IVPB 600 mg     600 mg 100 mL/hr over 30 Minutes Intravenous Every 8 hours 10/10/15 1519     10/10/15 1600  ampicillin-sulbactam (UNASYN) 1.5 g in sodium chloride 0.9 % 50 mL IVPB     1.5 g 100 mL/hr over 30 Minutes Intravenous Every 6 hours 10/10/15 1519     10/10/15 1045  clindamycin (CLEOCIN) IVPB 900 mg     900 mg 100 mL/hr over 30 Minutes Intravenous  Once 10/10/15 1032 10/10/15 1215      Assessment/Plan:  1 - Scrotal Abscess -  Doing well POD 1. Will remove packing tomorrow and determine if needs continued packing or not at that point. Agree with current ABX pending further CX data.  [facility], Grey Schlauch 10/11/2015

## 2015-10-11 NOTE — ED Provider Notes (Signed)
[facility] DEPT Provider Note   CSN: [id number] Arrival date & time: 10/10/15  7253  First Provider Contact:  First MD Initiated Contact with Patient 10/10/15 [phone]        History   Chief Complaint Chief Complaint  Patient presents with  . Abscess    HPI Edward [name] is a 41 y.o. male.  HPI Patient presents to the emergency department with a four-day history of swelling and abscess to his scrotum.  The patient states that yesterday he attempted to drain the abscess and noticed that some did drain but he felt like somewhat back into the scrotum.  The patient states the pain got worse and swelling increased, along with increased redness and warmth.  Patient states he is a diabetic.  He states that he has had a previous episode of abscess around August 1.  Patient states he did not take any medications prior to arrival.  Movement and palpation make the pain worseThe patient denies chest pain, shortness of breath, headache,blurred vision, neck pain, fever, cough, weakness, numbness, dizziness, anorexia, edema, abdominal pain, nausea, vomiting, diarrhea, rash, back pain, dysuria, hematemesis, bloody stool, near syncope, or syncope. Past Medical History:  Diagnosis Date  . Chicken pox   . Diabetes mellitus without complication ([place])   . Increased frequency of headaches     Patient Active Problem List   Diagnosis Date Noted  . Scrotal abscess 10/10/2015  . Chronic pain of right lower extremity 11/16/2013  . DM (diabetes mellitus), type 2 ([place]) 04/12/2012  . Migraine headache 04/12/2012  . Soft tissue mass 04/12/2012  . Tobacco use disorder 04/12/2012    Past Surgical History:  Procedure Laterality Date  . I&D EXTREMITY Right 05/08/2015   Procedure: IRRIGATION AND DEBRIDEMENT THUMB;  Surgeon: Iran Planas, MD;  Location: [facility];  Service: Orthopedics;  Laterality: Right;  . INCISE AND DRAIN ABCESS     left hand and jaw       Home Medications    Prior to Admission  medications   Medication Sig Start Date End Date Taking? Authorizing Provider  acetaminophen (TYLENOL) 500 MG tablet Take 500 mg by mouth every 6 (six) hours as needed (for pain.).   Yes Historical Provider, MD  Blood Glucose Monitoring Suppl (TRUE METRIX AIR GLUCOSE METER) w/Device KIT 1 each by Does not apply route daily. Patient not taking: Reported on 09/29/2015 04/29/15   Philemon Kingdom, MD  cephALEXin (KEFLEX) 500 MG capsule Take 1 capsule (500 mg total) by mouth 4 (four) times daily. Patient not taking: Reported on 10/10/2015 09/30/15   Charlann Lange, PA-C  cyclobenzaprine (FLEXERIL) 5 MG tablet Take 1 tablet (5 mg total) by mouth 3 (three) times daily as needed for muscle spasms. Patient not taking: Reported on 09/29/2015 09/03/15   Leonie Douglas, PA-C  gemfibrozil (LOPID) 600 MG tablet Take 1 tablet (600 mg total) by mouth 2 (two) times daily before a meal. Patient not taking: Reported on 09/29/2015 02/26/14   Jaynee Eagles, PA-C  glipiZIDE (GLUCOTROL) 5 MG tablet Take 1 tablet (5 mg total) by mouth 2 (two) times daily before a meal. Patient not taking: Reported on 09/29/2015 04/29/15   Philemon Kingdom, MD  glucose blood (TRUE METRIX BLOOD GLUCOSE TEST) test strip Use as instructed 2x a day Patient not taking: Reported on 09/29/2015 04/29/15   Philemon Kingdom, MD  HYDROcodone-acetaminophen (NORCO) 5-325 MG tablet Take 1 tablet by mouth every 6 (six) hours as needed. Patient not taking: Reported on 09/29/2015 09/03/15  Leonie Douglas, PA-C  metFORMIN (GLUCOPHAGE) 1000 MG tablet Take 1 tablet (1,000 mg total) by mouth 2 (two) times daily with a meal. Patient not taking: Reported on 09/29/2015 02/25/14   Jaynee Eagles, PA-C  oxyCODONE-acetaminophen (PERCOCET/ROXICET) 5-325 MG tablet Take 1 tablet by mouth every 4 (four) hours as needed for severe pain. Patient not taking: Reported on 10/10/2015 09/30/15   Charlann Lange, PA-C  TRUEPLUS LANCETS 33G MISC Use 2x a day - with TrueMetrix meter Patient not  taking: Reported on 09/29/2015 04/29/15   Philemon Kingdom, MD    Family History Family History  Problem Relation Age of Onset  . Heart disease Mother   . Diabetes Mother   . Hyperlipidemia Father   . Stroke Father   . Hypertension Father   . Lung cancer Father   . Cancer Paternal Grandfather     prostate    Social History Social History  Substance Use Topics  . Smoking status: Current Every Day Smoker    Packs/day: 1.00    Years: 29.00    Types: Cigarettes  . Smokeless tobacco: Never Used  . Alcohol use No     Allergies   Review of patient's allergies indicates no known allergies.   Review of Systems Review of Systems  All other systems negative except as documented in the HPI. All pertinent positives and negatives as reviewed in the HPI. Physical Exam Updated Vital Signs BP (!) 110/58 (BP Location: Right Arm)   Pulse 85   Temp 99 F (37.2 C) (Oral)   Resp 20   Ht _0  (1.727 m)   Wt 84.6 kg   SpO2 96%   BMI 28.34 kg/m   Physical Exam  Constitutional: He is oriented to person, place, and time. He appears well-developed and well-nourished. No distress.  HENT:  Head: Normocephalic and atraumatic.  Mouth/Throat: Oropharynx is clear and moist.  Eyes: Pupils are equal, round, and reactive to light.  Neck: Normal range of motion. Neck supple.  Cardiovascular: Normal rate, regular rhythm and normal heart sounds.  Exam reveals no gallop and no friction rub.   No murmur heard. Pulmonary/Chest: Effort normal and breath sounds normal. No respiratory distress. He has no wheezes.  Abdominal: Soft. Bowel sounds are normal. He exhibits no distension. There is no tenderness.  Genitourinary: Right testis shows swelling and tenderness.     Neurological: He is alert and oriented to person, place, and time. He exhibits normal muscle tone. Coordination normal.  Skin: Skin is warm and dry. No rash noted. No erythema.  Psychiatric: He has a normal mood and affect. His  behavior is normal.  Nursing note and vitals reviewed.    ED Treatments / Results  Labs (all labs ordered are listed, but only abnormal results are displayed) Labs Reviewed  BASIC METABOLIC PANEL - Abnormal; Notable for the following:       Result Value   Glucose, Bld 245 (*)    Calcium 8.5 (*)    All other components within normal limits  CBC WITH DIFFERENTIAL/PLATELET - Abnormal; Notable for the following:    WBC 11.8 (*)    Neutro Abs 9.0 (*)    All other components within normal limits  HEMOGLOBIN A1C - Abnormal; Notable for the following:    Hgb A1c MFr Bld 9.6 (*)    All other components within normal limits  BASIC METABOLIC PANEL - Abnormal; Notable for the following:    Glucose, Bld 108 (*)    Calcium 8.6 (*)  All other components within normal limits  GLUCOSE, CAPILLARY - Abnormal; Notable for the following:    Glucose-Capillary 121 (*)    All other components within normal limits  GLUCOSE, CAPILLARY - Abnormal; Notable for the following:    Glucose-Capillary 277 (*)    All other components within normal limits  GLUCOSE, CAPILLARY - Abnormal; Notable for the following:    Glucose-Capillary 164 (*)    All other components within normal limits  GLUCOSE, CAPILLARY - Abnormal; Notable for the following:    Glucose-Capillary 179 (*)    All other components within normal limits  CBG MONITORING, ED - Abnormal; Notable for the following:    Glucose-Capillary 276 (*)    All other components within normal limits  CBG MONITORING, ED - Abnormal; Notable for the following:    Glucose-Capillary 231 (*)    All other components within normal limits  CBG MONITORING, ED - Abnormal; Notable for the following:    Glucose-Capillary 172 (*)    All other components within normal limits  CULTURE, BLOOD (ROUTINE X 2)  CULTURE, BLOOD (ROUTINE X 2)  AEROBIC/ANAEROBIC CULTURE (SURGICAL/DEEP WOUND)  PROTIME-INR  CBC  MAGNESIUM  GLUCOSE, CAPILLARY  GLUCOSE, CAPILLARY  GLUCOSE,  CAPILLARY    EKG  EKG Interpretation None       Radiology Ct Pelvis W Contrast  Result Date: 10/10/2015 CLINICAL DATA:  Genital abscess for 4 days with recent lancing and increased pain, initial encounter EXAM: CT PELVIS WITH CONTRAST TECHNIQUE: Multidetector CT imaging of the pelvis was performed using the standard protocol following the bolus administration of intravenous contrast. CONTRAST:  ISOVUE-300 IOPAMIDOL (ISOVUE-300) INJECTION 61% COMPARISON:  None. FINDINGS: Along the posterior aspect of the scrotum in the midline there is a 1.8 x 1.1 x 1.0 cm fluid attenuation area with peripheral enhancement consistent with the given clinical history of abscess. The testicles of venous are within normal limits. Some mild hyperemia within the scrotum is noted. Multiple inguinal lymph nodes are noted and likely reactive in nature. The bladder is well distended. The large and small bowel as visualized are within normal limits. The appendix is within normal limits. No acute bony abnormality is seen. IMPRESSION: Changes consistent with an abscess along the posterior aspect of the scrotum. Reactive lymphadenopathy is noted in the inguinal regions bilaterally. Electronically Signed   By: Alcide Clever M.D.   On: 10/10/2015 12:07   Dg Chest Port 1 View  Result Date: 10/10/2015 CLINICAL DATA:  Pre op - Scrotal Abscess - 4 days of progressive inferior scrotal pain and swelling c/w abscess as eh has had on other body parts. Tried to I+D at home but quickly recurred. No fevers but some malaise. ARea approx 5cm. CT pelvis w/o subQ gas. WBC 11k. Smoker - pt states no other chest hx EXAM: PORTABLE CHEST 1 VIEW COMPARISON:  None. FINDINGS: The heart size and mediastinal contours are within normal limits. Both lungs are clear. No pleural effusion or pneumothorax. The visualized skeletal structures are unremarkable. IMPRESSION: No active disease. Electronically Signed   By: Amie Portland M.D.   On: 10/10/2015 14:55     Procedures Procedures (including critical care time)  Medications Ordered in ED Medications  dextrose 50 % solution 25 mL ( Intravenous MAR Unhold 10/10/15 1946)  HYDROcodone-acetaminophen (NORCO/VICODIN) 5-325 MG per tablet 1-2 tablet (2 tablets Oral Given 10/11/15 0946)  ondansetron (ZOFRAN) tablet 4 mg ( Oral MAR Unhold 10/10/15 1946)    Or  ondansetron (ZOFRAN) injection 4 mg ( Intravenous  MAR Unhold 10/10/15 1946)  heparin injection 5,000 Units (5,000 Units Subcutaneous Given 10/11/15 1335)  senna-docusate (Senokot-S) tablet 1 tablet ( Oral MAR Unhold 10/10/15 1946)  morphine 2 MG/ML injection 2 mg (2 mg Intravenous Given 10/11/15 1328)  gemfibrozil (LOPID) tablet 600 mg (600 mg Oral Given 10/11/15 0821)  clindamycin (CLEOCIN) IVPB 600 mg (600 mg Intravenous Given 10/11/15 1335)  ampicillin-sulbactam (UNASYN) 1.5 g in sodium chloride 0.9 % 50 mL IVPB (1.5 g Intravenous Given 10/11/15 1242)  insulin detemir (LEVEMIR) injection 20 Units (20 Units Subcutaneous Given 10/11/15 0946)  0.9 %  sodium chloride infusion ( Intravenous New Bag/Given 10/11/15 0646)  nicotine polacrilex (NICORETTE) gum 2 mg (not administered)  insulin aspart (novoLOG) injection 4 Units (4 Units Subcutaneous Given 10/11/15 1241)  insulin aspart (novoLOG) injection 0-5 Units (not administered)  insulin aspart (novoLOG) injection 0-9 Units (2 Units Subcutaneous Given 10/11/15 1240)  clindamycin (CLEOCIN) IVPB 900 mg (0 mg Intravenous Stopped 10/10/15 1215)  fentaNYL (SUBLIMAZE) injection 100 mcg (100 mcg Intravenous Given 10/10/15 1118)  iopamidol (ISOVUE-300) 61 % injection 100 mL (100 mLs Intravenous Contrast Given 10/10/15 1139)  HYDROmorphone (DILAUDID) injection 1 mg (1 mg Intravenous Given 10/10/15 1438)  insulin detemir (LEVEMIR) injection 10 Units (10 Units Subcutaneous Given 10/10/15 2207)  fluconazole (DIFLUCAN) tablet 150 mg (150 mg Oral Given 10/11/15 0053)     Initial Impression / Assessment and Plan / ED Course    I have reviewed the triage vital signs and the nursing notes.  Pertinent labs & imaging results that were available during my care of the patient were reviewed by me and considered in my medical decision making (see chart for details).  Clinical Course    I spoke with Dr. Tresa Moore from urology who will come and evaluate the patient also spoke with the [facility] hospitals will admit him for further IV antibiotics and evaluation.  Final Clinical Impressions(s) / ED Diagnoses   Final diagnoses:  SOB (shortness of breath)    New Prescriptions Current Discharge Medication List       Dalia Heading, PA-C 10/11/15 1534    Lacretia Leigh, MD 10/12/15 0700

## 2015-10-12 LAB — GLUCOSE, CAPILLARY: Glucose-Capillary: 249 mg/dL — ABNORMAL HIGH (ref 65–99)

## 2015-10-12 MED ORDER — AMOXICILLIN-POT CLAVULANATE 875-125 MG PO TABS: 1.0000 | ORAL_TABLET | Freq: Two times a day (BID) | ORAL | 0 refills | Status: DC

## 2015-10-12 MED ORDER — SACCHAROMYCES BOULARDII 250 MG PO CAPS: 250.0000 mg | ORAL_CAPSULE | Freq: Two times a day (BID) | ORAL | 0 refills | Status: DC

## 2015-10-12 MED ORDER — HYDROCODONE-ACETAMINOPHEN 5-325 MG PO TABS: 1.0000 | ORAL_TABLET | Freq: Four times a day (QID) | ORAL | 0 refills | Status: DC | PRN

## 2015-10-12 MED ORDER — CLINDAMYCIN HCL 300 MG PO CAPS: 600.0000 mg | ORAL_CAPSULE | Freq: Three times a day (TID) | ORAL | 0 refills | Status: AC

## 2015-10-12 NOTE — Discharge Instructions (Signed)
Follow with Primary MD [name], MD in 2-3 days   Get CBC, CMP, CBGs checked  by Primary MD or SNF MD in 5-7 days ( we routinely change or add medications that can affect your baseline labs and fluid status, therefore we recommend that you get the mentioned basic workup next visit with your PCP, your PCP may decide not to get them or add new tests based on their clinical decision)   Activity: As tolerated with Full fall precautions use walker/cane & assistance as needed   Disposition Home    Diet:   Heart Healthy Low Carb.  Accuchecks 4 times/day, Once in AM empty stomach and then before each meal. Log in all results and show them to your Prim.MD in 3 days. If any glucose reading is under 80 or above 300 call your Prim MD immidiately. Follow Low glucose instructions for glucose under 80 as instructed.   For Heart failure patients - Check your Weight same time everyday, if you gain over 2 pounds, or you develop in leg swelling, experience more shortness of breath or chest pain, call your Primary MD immediately. Follow Cardiac Low Salt Diet and 1.5 lit/day fluid restriction.   On your next visit with your primary care physician please Get Medicines reviewed and adjusted.   Please request your Prim.MD to go over all Hospital Tests and Procedure/Radiological results at the follow up, please get all Hospital records sent to your Prim MD by signing hospital release before you go home.   If you experience worsening of your admission symptoms, develop shortness of breath, life threatening emergency, suicidal or homicidal thoughts you must seek medical attention immediately by calling 911 or calling your MD immediately  if symptoms less severe.  You Must read complete instructions/literature along with all the possible adverse reactions/side effects for all the Medicines you take and that have been prescribed to you. Take any new Medicines after you have completely understood and accpet  all the possible adverse reactions/side effects.   Do not drive, operate heavy machinery, perform activities at heights, swimming or participation in water activities or provide baby sitting services if your were admitted for syncope or siezures until you have seen by Primary MD or a Neurologist and advised to do so again.  Do not drive when taking Pain medications.    Do not take more than prescribed Pain, Sleep and Anxiety Medications  Special Instructions: If you have smoked or chewed Tobacco  in the last 2 yrs please stop smoking, stop any regular Alcohol  and or any Recreational drug use.  Wear Seat belts while driving.   Please note  You were cared for by a hospitalist during your hospital stay. If you have any questions about your discharge medications or the care you received while you were in the hospital after you are discharged, you can call the unit and asked to speak with the hospitalist on call if the hospitalist that took care of you is not available. Once you are discharged, your primary care physician will handle any further medical issues. Please note that NO REFILLS for any discharge medications will be authorized once you are discharged, as it is imperative that you return to your primary care physician (or establish a relationship with a primary care physician if you do not have one) for your aftercare needs so that they can reassess your need for medications and monitor your lab values.  Edward [name] was admitted to the Hospital on 10/10/2015 and Discharged  10/12/2015 and should be excused from work/school   for 14  days starting from date -  10/10/2015 , may return to work/school without any restrictions.  Call Edward RaringPrashant Sabrinna Yearwood MD, [facility]  [phone] with questions.  Edward [name],Edward [name] K M.D on 10/12/2015,at 7:46 AM  [facility]   Office  [phone]

## 2015-10-12 NOTE — Progress Notes (Signed)
2 Days Post-Op  Subjective:  1 - Scrotal Abscess-  S/p I+D scrotal abscess 10/10/15 for progressive localized soft tissue infection. Initial CT w/o subQ gas. WCX GPC's / pending. Now on emperic Clinda + Unasyn.    Today "Aggie Hacker" is stable. No fevers. WCX with GPC's. WOund packign removed today.    Objective: Vital signs in last 24 hours: Temp:  [98.1 F (36.7 C)-99 F (37.2 C)] 98.1 F (36.7 C) (08/13 0514) Pulse Rate:  [73-90] 73 (08/13 0514) Resp:  [19-20] 19 (08/13 0514) BP: (101-122)/(58-82) 101/64 (08/13 0514) SpO2:  [95 %-97 %] 97 % (08/13 0514) Weight:  [83.9 kg (185 lb)] 83.9 kg (185 lb) (08/13 0514) Last BM Date: 10/11/15  Intake/Output from previous day: 08/12 0701 - 08/13 0700 In: 1681.7 [P.O.:520; I.V.:811.7; IV Piggyback:350] Out: -  Intake/Output this shift: No intake/output data recorded.  General appearance: alert, cooperative, appears stated age and family at bedside Eyes: negative Nose: Nares normal. Septum midline. Mucosa normal. No drainage or sinus tenderness. Throat: lips, mucosa, and tongue normal; teeth and gums normal Back: symmetric, no curvature. ROM normal. No CVA tenderness. Resp: non-labored on room air Cardio: Nl rate GI: soft, non-tender; bowel sounds normal; no masses,  no organomegaly Male genitalia: improving induration around area of scrotal abscess. packing removed. Cavity <1 in inch at this point therefore not repacked.  Extremities: extremities normal, atraumatic, no cyanosis or edema Skin: Skin color, texture, turgor normal. No rashes or lesions Lymph nodes: Cervical, supraclavicular, and axillary nodes normal. Neurologic: Grossly normal  Lab Results:   Recent Labs  10/10/15 0939 10/11/15 0516  WBC 11.8* 10.2  HGB 14.8 14.8  HCT 43.1 44.6  PLT 225 231   BMET  Recent Labs  10/10/15 0939 10/11/15 0516  NA 136 138  K 4.1 4.2  CL 106 105  CO2 24 28  GLUCOSE 245* 108*  BUN 13 9  CREATININE 0.68 0.73  CALCIUM 8.5*  8.6*   PT/INR  Recent Labs  10/10/15 1356  LABPROT 13.5  INR 1.03   ABG No results for input(s): PHART, HCO3 in the last 72 hours.  Invalid input(s): PCO2, PO2  Studies/Results: Ct Pelvis W Contrast  Result Date: 10/10/2015 CLINICAL DATA:  Genital abscess for 4 days with recent lancing and increased pain, initial encounter EXAM: CT PELVIS WITH CONTRAST TECHNIQUE: Multidetector CT imaging of the pelvis was performed using the standard protocol following the bolus administration of intravenous contrast. CONTRAST:  ISOVUE-300 IOPAMIDOL (ISOVUE-300) INJECTION 61% COMPARISON:  None. FINDINGS: Along the posterior aspect of the scrotum in the midline there is a 1.8 x 1.1 x 1.0 cm fluid attenuation area with peripheral enhancement consistent with the given clinical history of abscess. The testicles of venous are within normal limits. Some mild hyperemia within the scrotum is noted. Multiple inguinal lymph nodes are noted and likely reactive in nature. The bladder is well distended. The large and small bowel as visualized are within normal limits. The appendix is within normal limits. No acute bony abnormality is seen. IMPRESSION: Changes consistent with an abscess along the posterior aspect of the scrotum. Reactive lymphadenopathy is noted in the inguinal regions bilaterally. Electronically Signed   By: Alcide Clever M.D.   On: 10/10/2015 12:07   Dg Chest Port 1 View  Result Date: 10/10/2015 CLINICAL DATA:  Pre op - Scrotal Abscess - 4 days of progressive inferior scrotal pain and swelling c/w abscess as eh has had on other body parts. Tried to I+D at home but  quickly recurred. No fevers but some malaise. ARea approx 5cm. CT pelvis w/o subQ gas. WBC 11k. Smoker - pt states no other chest hx EXAM: PORTABLE CHEST 1 VIEW COMPARISON:  None. FINDINGS: The heart size and mediastinal contours are within normal limits. Both lungs are clear. No pleural effusion or pneumothorax. The visualized skeletal  structures are unremarkable. IMPRESSION: No active disease. Electronically Signed   By: Amie Portlandavid  Ormond M.D.   On: 10/10/2015 14:55    Anti-infectives: Anti-infectives    Start     Dose/Rate Route Frequency Ordered Stop   10/11/15 0045  fluconazole (DIFLUCAN) tablet 150 mg     150 mg Oral  Once 10/11/15 0031 10/11/15 0053   10/10/15 2200  clindamycin (CLEOCIN) IVPB 600 mg     600 mg 100 mL/hr over 30 Minutes Intravenous Every 8 hours 10/10/15 1519     10/10/15 1600  ampicillin-sulbactam (UNASYN) 1.5 g in sodium chloride 0.9 % 50 mL IVPB     1.5 g 100 mL/hr over 30 Minutes Intravenous Every 6 hours 10/10/15 1519     10/10/15 1045  clindamycin (CLEOCIN) IVPB 900 mg     900 mg 100 mL/hr over 30 Minutes Intravenous  Once 10/10/15 1032 10/10/15 1215      Assessment/Plan:   1 - Scrotal Abscess-  Continues to improve clinically. Discussed wound care with pt and family, I do not feel needs re-packing as cavity quite small. OK for DC form Urol perspective on PO ABX per primary team.   We will request f/u for wound check in about 2weeks ar our office.  [facility], Kirin Pastorino 10/12/2015

## 2015-10-12 NOTE — Progress Notes (Signed)
Pt and Wife verbalized understanding of all discharge instructions and understanding all medications and prescriptions along with discharge papers given to Pt at time of discharge. VSS No acute changes noted with Pt at time of discharge

## 2015-10-12 NOTE — Discharge Summary (Signed)
Edward [name] OHC:[id number] DOB: 1975/07/24 DOA: 10/10/2015  PCP: Robyn Haber, MD  Admit date: 10/10/2015  Discharge date: 10/12/2015  Admitted From: Home   Disposition:  Home   Recommendations for Outpatient Follow-up:   Follow up with PCP in 1-2 weeks  PCP Please obtain BMP/CBC, 2 view CXR in 1week,  (see Discharge instructions)   PCP Please follow up on the following pending results: None   Home Health: None Equipment/Devices: None  Consultations: Urology Discharge Condition: Stable   CODE STATUS: Full   Diet Recommendation: Heart Healthy Low Carb   Chief Complaint  Patient presents with  . Abscess     Brief history of present illness from the day of admission and additional interim summary    Edward [name] a 40 y.o.male,With history of type 2 diabetes mellitus, migraine headaches, essential hypertension who was in a good state of health until about 5 days ago when he started developing scrotal pain and in duration, for the last few days he noted that there was an area of fluctuance, yesterday he bought a new scalpel and tried to lance his abscess area and drain it himself. Subsequently he developed increased pain and tenderness and came to the ER.  The ER workups yesterday for scrotal abscess, urology has been consulted and I was requested to admit the patient. Patient denies any fever or chills or systemic symptoms, no headache, no chest or abdominal pain, no diarrhea, no blood in stool or urine or focal weakness. All other review of systems unremarkable except as above.    Hospital issues addressed     1. Scrotal abscess in a patient with diabetes mellitus type 2. CT scan rules out for his gangrene, blood cultures drawn, placed on clindamycin and Unasyn. He was not septic. Urology  following he is post I&D of Scrotal abscess by urologist Dr. Tresa Moore on 10/10/2015, clinically better, DC on PO ABX per Urology.   2. DM type II. Poor outpt control, request PCP to monitor and adjust regimen for tighter control.  Lab Results  Component Value Date   HGBA1C 9.6 (H) 10/10/2015     3.Dyslipidemia. Continue Lopid.  4.History of smoking. Counseled to quit.  Discharge diagnosis     Active Problems:   DM (diabetes mellitus), type 2 (HCC)   Migraine headache   Tobacco use disorder   Scrotal abscess    Discharge instructions    Discharge Instructions    Discharge instructions    Complete by:  As directed   Follow with Primary MD Robyn Haber, MD in 2-3 days   Get CBC, CMP, CBGs checked  by Primary MD or SNF MD in 5-7 days ( we routinely change or add medications that can affect your baseline labs and fluid status, therefore we recommend that you get the mentioned basic workup next visit with your PCP, your PCP may decide not to get them or add new tests based on their clinical decision)   Activity: As tolerated with Full fall precautions use walker/cane & assistance as needed  Disposition Home    Diet:   Heart Healthy Low Carb.  Accuchecks 4 times/day, Once in AM empty stomach and then before each meal. Log in all results and show them to your Prim.MD in 3 days. If any glucose reading is under 80 or above 300 call your Prim MD immidiately. Follow Low glucose instructions for glucose under 80 as instructed.   For Heart failure patients - Check your Weight same time everyday, if you gain over 2 pounds, or you develop in leg swelling, experience more shortness of breath or chest pain, call your Primary MD immediately. Follow Cardiac Low Salt Diet and 1.5 lit/day fluid restriction.   On your next visit with your primary care physician please Get Medicines reviewed and adjusted.   Please request your Prim.MD to go over all Hospital Tests and  Procedure/Radiological results at the follow up, please get all Hospital records sent to your Prim MD by signing hospital release before you go home.   If you experience worsening of your admission symptoms, develop shortness of breath, life threatening emergency, suicidal or homicidal thoughts you must seek medical attention immediately by calling 911 or calling your MD immediately  if symptoms less severe.  You Must read complete instructions/literature along with all the possible adverse reactions/side effects for all the Medicines you take and that have been prescribed to you. Take any new Medicines after you have completely understood and accpet all the possible adverse reactions/side effects.   Do not drive, operate heavy machinery, perform activities at heights, swimming or participation in water activities or provide baby sitting services if your were admitted for syncope or siezures until you have seen by Primary MD or a Neurologist and advised to do so again.  Do not drive when taking Pain medications.    Do not take more than prescribed Pain, Sleep and Anxiety Medications  Special Instructions: If you have smoked or chewed Tobacco  in the last 2 yrs please stop smoking, stop any regular Alcohol  and or any Recreational drug use.  Wear Seat belts while driving.   Please note  You were cared for by a hospitalist during your hospital stay. If you have any questions about your discharge medications or the care you received while you were in the hospital after you are discharged, you can call the unit and asked to speak with the hospitalist on call if the hospitalist that took care of you is not available. Once you are discharged, your primary care physician will handle any further medical issues. Please note that NO REFILLS for any discharge medications will be authorized once you are discharged, as it is imperative that you return to your primary care physician (or establish a  relationship with a primary care physician if you do not have one) for your aftercare needs so that they can reassess your need for medications and monitor your lab values.                                                      Edward [name] was admitted to the Hospital on 10/10/2015 and Discharged  10/12/2015 and should be excused from work/school   for 14  days starting from date -  10/10/2015 , may return to work/school without any restrictions.  Call Lala Lund MD, [facility]  [phone] with questions.  Thurnell Lose M.D on 10/12/2015,at 7:46 AM  [facility]   Office  [phone]   Increase activity slowly    Complete by:  As directed      Discharge Medications     Medication List    STOP taking these medications   cephALEXin 500 MG capsule Commonly known as:  KEFLEX     TAKE these medications   acetaminophen 500 MG tablet Commonly known as:  TYLENOL Take 500 mg by mouth every 6 (six) hours as needed (for pain.).   amoxicillin-clavulanate 875-125 MG tablet Commonly known as:  AUGMENTIN Take 1 tablet by mouth 2 (two) times daily.   clindamycin 300 MG capsule Commonly known as:  CLEOCIN Take 2 capsules (600 mg total) by mouth 3 (three) times daily.   cyclobenzaprine 5 MG tablet Commonly known as:  FLEXERIL Take 1 tablet (5 mg total) by mouth 3 (three) times daily as needed for muscle spasms.   gemfibrozil 600 MG tablet Commonly known as:  LOPID Take 1 tablet (600 mg total) by mouth 2 (two) times daily before a meal.   glipiZIDE 5 MG tablet Commonly known as:  GLUCOTROL Take 1 tablet (5 mg total) by mouth 2 (two) times daily before a meal.   glucose blood test strip Commonly known as:  TRUE METRIX BLOOD GLUCOSE TEST Use as instructed 2x a day   HYDROcodone-acetaminophen 5-325 MG tablet Commonly known as:  NORCO Take 1 tablet by mouth every 6 (six) hours as needed.   metFORMIN 1000 MG tablet Commonly known as:  GLUCOPHAGE Take 1  tablet (1,000 mg total) by mouth 2 (two) times daily with a meal.   oxyCODONE-acetaminophen 5-325 MG tablet Commonly known as:  PERCOCET/ROXICET Take 1 tablet by mouth every 4 (four) hours as needed for severe pain.   saccharomyces boulardii 250 MG capsule Commonly known as:  FLORASTOR Take 1 capsule (250 mg total) by mouth 2 (two) times daily.   TRUE METRIX AIR GLUCOSE METER w/Device Kit 1 each by Does not apply route daily.   TRUEPLUS LANCETS 33G Misc Use 2x a day - with TrueMetrix meter       [facility]   Why:  We will call you to arrange visit for wound check in few weeks.  Contact information: [address] [place] [address] [phone]        Robyn Haber, MD. Schedule an appointment as soon as possible for a visit today.   Specialty:  Family Medicine Contact information: [place] [address] [phone]        Alexis Frock, MD. Schedule an appointment as soon as possible for a visit in 5 day(s).   Specialty:  Urology Contact information: [place] [address] [phone]           Major procedures and Radiology Reports - PLEASE review detailed and final reports thoroughly  -        Ct Pelvis W Contrast  Result Date: 10/10/2015 CLINICAL DATA:  Genital abscess for 4 days with recent lancing and increased pain, initial encounter EXAM: CT PELVIS WITH CONTRAST TECHNIQUE: Multidetector CT imaging of the pelvis was performed using the standard protocol following the bolus administration of intravenous contrast. CONTRAST:  160m ISOVUE-300 IOPAMIDOL (ISOVUE-300) INJECTION 61% COMPARISON:  None. FINDINGS: Along the posterior aspect of the scrotum in the midline there is a 1.8 x 1.1 x 1.0 cm fluid attenuation area with peripheral enhancement  consistent with the given clinical history of abscess. The testicles of venous are within normal limits. Some mild hyperemia  within the scrotum is noted. Multiple inguinal lymph nodes are noted and likely reactive in nature. The bladder is well distended. The large and small bowel as visualized are within normal limits. The appendix is within normal limits. No acute bony abnormality is seen. IMPRESSION: Changes consistent with an abscess along the posterior aspect of the scrotum. Reactive lymphadenopathy is noted in the inguinal regions bilaterally. Electronically Signed   By: Inez Catalina M.D.   On: 10/10/2015 12:07   Dg Chest Port 1 View  Result Date: 10/10/2015 CLINICAL DATA:  Pre op - Scrotal Abscess - 4 days of progressive inferior scrotal pain and swelling c/w abscess as eh has had on other body parts. Tried to I+D at home but quickly recurred. No fevers but some malaise. ARea approx 5cm. CT pelvis w/o subQ gas. WBC 11k. Smoker - pt states no other chest hx EXAM: PORTABLE CHEST 1 VIEW COMPARISON:  None. FINDINGS: The heart size and mediastinal contours are within normal limits. Both lungs are clear. No pleural effusion or pneumothorax. The visualized skeletal structures are unremarkable. IMPRESSION: No active disease. Electronically Signed   By: Lajean Manes M.D.   On: 10/10/2015 14:55    Micro Results    Recent Results (from the past 240 hour(s))  Culture, blood (routine x 2)     Status: None (Preliminary result)   Collection Time: 10/10/15  1:56 PM  Result Value Ref Range Status   Specimen Description BLOOD RIGHT ANTECUBITAL  Final   Special Requests BOTTLES DRAWN AEROBIC AND ANAEROBIC 5 CC EA  Final   Culture   Final    NO GROWTH < 24 HOURS Performed at [facility]    Report Status PENDING  Incomplete  Culture, blood (routine x 2)     Status: None (Preliminary result)   Collection Time: 10/10/15  1:57 PM  Result Value Ref Range Status   Specimen Description BLOOD LEFT HAND  Final   Special Requests BOTTLES DRAWN AEROBIC AND ANAEROBIC 5 CC EA  Final   Culture   Final    NO GROWTH < 24  HOURS Performed at [facility]    Report Status PENDING  Incomplete  Aerobic/Anaerobic Culture (surgical/deep wound)     Status: None (Preliminary result)   Collection Time: 10/10/15  6:15 PM  Result Value Ref Range Status   Specimen Description ABSCESS  Final   Special Requests NONE  Final   Gram Stain   Final    ABUNDANT WBC PRESENT, PREDOMINANTLY PMN FEW GRAM POSITIVE COCCI IN PAIRS    Culture   Final    CULTURE REINCUBATED FOR BETTER GROWTH Performed at [facility]    Report Status PENDING  Incomplete    Today   Subjective    Fanny Bien today has no headache,no chest abdominal pain,no new weakness tingling or numbness, feels much better wants to go home today.    Objective   Blood pressure 101/64, pulse 73, temperature 98.1 F (36.7 C), temperature source Oral, resp. rate 19, height 5' 8"  (1.727 m), weight 83.9 kg (185 lb), SpO2 97 %.   Intake/Output Summary (Last 24 hours) at 10/12/15 0752 Last data filed at 10/11/15 2300  Gross per 24 hour  Intake          1681.67 ml  Output  0 ml  Net          1681.67 ml    Exam Awake Alert, Oriented x 3, No new F.N deficits, Normal affect [place].AT,PERRAL Supple Neck,No JVD, No cervical lymphadenopathy appriciated.  Symmetrical Chest wall movement, Good air movement bilaterally, CTAB RRR,No Gallops,Rubs or new Murmurs, No Parasternal Heave +ve B.Sounds, Abd Soft, Non tender, No organomegaly appriciated, No rebound -guarding or rigidity. No Cyanosis, Clubbing or edema, No new Rash or bruise, scrotum under bandage   Data Review   CBC w Diff: Lab Results  Component Value Date   WBC 10.2 10/11/2015   HGB 14.8 10/11/2015   HCT 44.6 10/11/2015   PLT 231 10/11/2015   LYMPHOPCT 17 10/10/2015   MONOPCT 6 10/10/2015   EOSPCT 1 10/10/2015   BASOPCT 0 10/10/2015    CMP: Lab Results  Component Value Date   NA 138 10/11/2015   K 4.2 10/11/2015   CL 105 10/11/2015   CO2 28 10/11/2015   BUN 9  10/11/2015   CREATININE 0.73 10/11/2015   CREATININE 0.72 04/02/2015   PROT 6.4 04/02/2015   ALBUMIN 4.3 04/02/2015   BILITOT 0.4 04/02/2015   ALKPHOS 81 04/02/2015   AST 14 04/02/2015   ALT 26 04/02/2015  .   Total Time in preparing paper work, data evaluation and todays exam - 35 minutes  Thurnell Lose M.D on 10/12/2015 at 7:52 AM  [facility]   Office  [phone]

## 2015-10-13 ENCOUNTER — Encounter (HOSPITAL_COMMUNITY): Payer: Self-pay | Admitting: Urology

## 2015-10-13 MED FILL — AMOX-CLAV 875-125 MG TABLET: 875-125 | 5 days supply | Qty: 10 | Fill #0

## 2015-10-14 ENCOUNTER — Other Ambulatory Visit (HOSPITAL_COMMUNITY): Payer: Self-pay | Admitting: Internal Medicine

## 2015-10-14 DIAGNOSIS — M546 Pain in thoracic spine: Secondary | ICD-10-CM

## 2015-10-15 ENCOUNTER — Other Ambulatory Visit: Payer: Self-pay

## 2015-10-15 LAB — AEROBIC/ANAEROBIC CULTURE (SURGICAL/DEEP WOUND)

## 2015-10-15 LAB — CULTURE, BLOOD (ROUTINE X 2)
Culture: NO GROWTH
Culture: NO GROWTH

## 2015-10-15 LAB — AEROBIC/ANAEROBIC CULTURE W GRAM STAIN (SURGICAL/DEEP WOUND)

## 2015-10-15 MED ORDER — GLUCOSE BLOOD VI STRP: ORAL_STRIP | 1 refills | Status: DC

## 2015-10-15 MED FILL — ACCU-CHEK AVIVA PLUS TEST S: 90 days supply | Qty: 100 | Fill #0

## 2015-10-16 DIAGNOSIS — N492 Inflammatory disorders of scrotum: Secondary | ICD-10-CM | POA: Diagnosis not present

## 2015-10-16 MED FILL — HYDROCODON-APAP 5-325: 5-325 | 5 days supply | Qty: 20 | Fill #0

## 2015-10-16 MED FILL — AMPICILLIN TR 500 MG CAP: 500 | 21 days supply | Qty: 63 | Fill #0

## 2015-11-25 ENCOUNTER — Other Ambulatory Visit (HOSPITAL_COMMUNITY): Payer: Self-pay | Admitting: Otolaryngology

## 2015-11-25 ENCOUNTER — Ambulatory Visit (HOSPITAL_COMMUNITY)
Admission: RE | Admit: 2015-11-25 | Discharge: 2015-11-25 | Disposition: A | Discharge status: Home / Self Care | Payer: 59 | Source: Ambulatory Visit | Attending: Otolaryngology | Admitting: Otolaryngology

## 2015-11-25 DIAGNOSIS — J324 Chronic pansinusitis: Secondary | ICD-10-CM | POA: Diagnosis not present

## 2015-11-25 DIAGNOSIS — J341 Cyst and mucocele of nose and nasal sinus: Secondary | ICD-10-CM | POA: Diagnosis not present

## 2015-11-25 DIAGNOSIS — J329 Chronic sinusitis, unspecified: Secondary | ICD-10-CM | POA: Diagnosis not present

## 2015-11-25 DIAGNOSIS — E119 Type 2 diabetes mellitus without complications: Secondary | ICD-10-CM | POA: Diagnosis not present

## 2015-11-25 DIAGNOSIS — R51 Headache: Secondary | ICD-10-CM | POA: Diagnosis not present

## 2015-11-25 DIAGNOSIS — F17210 Nicotine dependence, cigarettes, uncomplicated: Secondary | ICD-10-CM | POA: Diagnosis not present

## 2015-11-26 ENCOUNTER — Other Ambulatory Visit (HOSPITAL_COMMUNITY): Payer: Self-pay | Admitting: Otolaryngology

## 2015-11-26 DIAGNOSIS — R51 Headache: Principal | ICD-10-CM

## 2015-11-26 DIAGNOSIS — R519 Headache, unspecified: Secondary | ICD-10-CM

## 2015-11-28 ENCOUNTER — Other Ambulatory Visit (HOSPITAL_COMMUNITY): Payer: Self-pay | Admitting: Otolaryngology

## 2015-11-28 DIAGNOSIS — R519 Headache, unspecified: Secondary | ICD-10-CM

## 2015-11-28 DIAGNOSIS — R51 Headache: Principal | ICD-10-CM

## 2015-11-29 ENCOUNTER — Ambulatory Visit (HOSPITAL_COMMUNITY)
Admission: RE | Admit: 2015-11-29 | Discharge: 2015-11-29 | Disposition: A | Discharge status: Home / Self Care | Payer: 59 | Source: Ambulatory Visit | Attending: Otolaryngology | Admitting: Otolaryngology

## 2015-11-29 ENCOUNTER — Other Ambulatory Visit (HOSPITAL_COMMUNITY): Payer: Self-pay | Admitting: Otolaryngology

## 2015-11-29 DIAGNOSIS — T1590XA Foreign body on external eye, part unspecified, unspecified eye, initial encounter: Secondary | ICD-10-CM

## 2015-11-29 DIAGNOSIS — R51 Headache: Secondary | ICD-10-CM | POA: Insufficient documentation

## 2015-11-29 DIAGNOSIS — Z01818 Encounter for other preprocedural examination: Secondary | ICD-10-CM | POA: Diagnosis not present

## 2015-11-29 DIAGNOSIS — R519 Headache, unspecified: Secondary | ICD-10-CM

## 2015-11-29 LAB — POCT I-STAT CREATININE: CREATININE: 0.7 mg/dL (ref 0.61–1.24)

## 2015-11-29 MED ORDER — GADOBENATE DIMEGLUMINE 529 MG/ML IV SOLN
17.0000 mL | Freq: Once | INTRAVENOUS | Status: AC | PRN
  Administered 2015-11-29: 17 mL via INTRAVENOUS

## 2015-12-02 ENCOUNTER — Encounter (HOSPITAL_COMMUNITY): Payer: Self-pay | Admitting: Emergency Medicine

## 2015-12-02 ENCOUNTER — Emergency Department (HOSPITAL_COMMUNITY)
Admission: EM | Admit: 2015-12-02 | Discharge: 2015-12-02 | Disposition: A | Discharge status: Home / Self Care | Payer: 59 | Attending: Emergency Medicine | Admitting: Emergency Medicine

## 2015-12-02 DIAGNOSIS — F17210 Nicotine dependence, cigarettes, uncomplicated: Secondary | ICD-10-CM | POA: Diagnosis not present

## 2015-12-02 DIAGNOSIS — H9202 Otalgia, left ear: Secondary | ICD-10-CM | POA: Diagnosis present

## 2015-12-02 DIAGNOSIS — Z79899 Other long term (current) drug therapy: Secondary | ICD-10-CM | POA: Insufficient documentation

## 2015-12-02 DIAGNOSIS — H6692 Otitis media, unspecified, left ear: Secondary | ICD-10-CM | POA: Diagnosis not present

## 2015-12-02 DIAGNOSIS — Z791 Long term (current) use of non-steroidal anti-inflammatories (NSAID): Secondary | ICD-10-CM | POA: Diagnosis not present

## 2015-12-02 DIAGNOSIS — H6690 Otitis media, unspecified, unspecified ear: Secondary | ICD-10-CM

## 2015-12-02 DIAGNOSIS — Z7984 Long term (current) use of oral hypoglycemic drugs: Secondary | ICD-10-CM | POA: Insufficient documentation

## 2015-12-02 DIAGNOSIS — E119 Type 2 diabetes mellitus without complications: Secondary | ICD-10-CM | POA: Insufficient documentation

## 2015-12-02 HISTORY — DX: Cutaneous abscess, unspecified: L02.91

## 2015-12-02 MED ORDER — BENZOCAINE 10 MG MT LOZG: 1.0000 | LOZENGE | OROMUCOSAL | 0 refills | Status: DC

## 2015-12-02 MED ORDER — AMOXICILLIN 500 MG PO CAPS: 500.0000 mg | ORAL_CAPSULE | Freq: Three times a day (TID) | ORAL | 0 refills | Status: DC

## 2015-12-02 MED ORDER — HYDROCODONE-ACETAMINOPHEN 7.5-325 MG/15ML PO SOLN: 10.0000 mL | Freq: Four times a day (QID) | ORAL | 0 refills | Status: DC | PRN

## 2015-12-02 MED FILL — HYDROCOD-APAP 7.5-325/15ML: 7.5-325 | 3 days supply | Qty: 150 | Fill #0

## 2015-12-02 MED FILL — AMOXICILLIN 500 MG CAPSULE: 500 | 7 days supply | Qty: 21 | Fill #0

## 2015-12-02 NOTE — ED Triage Notes (Signed)
Pt presents with complaints of facial drooping of the left side of his face for the past 6-7 months.  Has seen ENT doctor and has gotten received intensive testing that has all been negative.  States for the past few weeks he has had a sore throat and left ear pain that intensified this morning.  On assessment patient does have some noticeable drooping to left side of face. States ENT has ruled out bell's palsy and stroke.  Pt thinks it may be an ear infection.

## 2015-12-02 NOTE — ED Provider Notes (Signed)
[facility] DEPT Provider Note   CSN: [id number] Arrival date & time: 12/02/15  8185     History   Chief Complaint Chief Complaint  Patient presents with  . Otalgia  . Sore Throat    HPI Edward [name] is a 40 y.o. male.  HPI   Pt with hx DM p/w left sided sore throat and left ear pain.  Sore throat has been ongoing x 2 weeks, is sore and burning.  Left ear pain is throbbing, gradually worsening.  Subjective fevers at home.  Increased facial pressure, nasal congestion.  No known sick contacts.  Has taken chlorasceptic and tylenol without improvement.  Pt also has had left facial droop and tingling in the left face x 7 months, being followed by Dr Janace Hoard, has had negative CT maxillofacial and MRI brain.  No change in this with current symptoms.    Past Medical History:  Diagnosis Date  . Abscess   . Chicken pox   . 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 Increased frequency of headaches     Patient Active Problem List   Diagnosis Date Noted  . Scrotal abscess 10/10/2015  . Chronic pain of right lower extremity 11/16/2013  . DM (diabetes mellitus), type 2 ([place]) 04/12/2012  . Migraine headache 04/12/2012  . Soft tissue mass 04/12/2012  . Tobacco use disorder 04/12/2012    Past Surgical History:  Procedure Laterality Date  . I&D EXTREMITY Right 05/08/2015   Procedure: IRRIGATION AND DEBRIDEMENT THUMB;  Surgeon: Iran Planas, MD;  Location: [facility];  Service: Orthopedics;  Laterality: Right;  . INCISE AND DRAIN ABCESS     left hand and jaw  . IRRIGATION AND DEBRIDEMENT ABSCESS N/A 10/10/2015   Procedure: IRRIGATION AND DEBRIDEMENT SCROTAL ABSCESS;  Surgeon: Alexis Frock, MD;  Location: [facility];  Service: Urology;  Laterality: N/A;       Home Medications    Prior to Admission medications   Medication Sig Start Date End Date Taking? Authorizing Provider  acetaminophen (TYLENOL) 500 MG tablet Take 500 mg by mouth every 6 (six) hours as  needed (for pain.).    Historical Provider, MD  amoxicillin (AMOXIL) 500 MG capsule Take 1 capsule (500 mg total) by mouth 3 (three) times daily. 12/02/15   Clayton Bibles, PA-C  amoxicillin-clavulanate (AUGMENTIN) 875-125 MG tablet Take 1 tablet by mouth 2 (two) times daily. 10/12/15   Thurnell Lose, MD  Benzocaine 10 MG LOZG Use as directed 1 lozenge (10 mg total) in the mouth or throat as directed. 12/02/15   Clayton Bibles, PA-C  Blood Glucose Monitoring Suppl (TRUE METRIX AIR GLUCOSE METER) w/Device KIT 1 each by Does not apply route daily. Patient not taking: Reported on 09/29/2015 04/29/15   Philemon Kingdom, MD  cyclobenzaprine (FLEXERIL) 5 MG tablet Take 1 tablet (5 mg total) by mouth 3 (three) times daily as needed for muscle spasms. Patient not taking: Reported on 09/29/2015 09/03/15   Leonie Douglas, PA-C  gemfibrozil (LOPID) 600 MG tablet Take 1 tablet (600 mg total) by mouth 2 (two) times daily before a meal. Patient not taking: Reported on 09/29/2015 02/26/14   Jaynee Eagles, PA-C  glipiZIDE (GLUCOTROL) 5 MG tablet Take 1 tablet (5 mg total) by mouth 2 (two) times daily before a meal. Patient not taking: Reported on 09/29/2015 04/29/15   Philemon Kingdom, MD  glucose blood (ACCU-CHEK AVIVA PLUS) test strip Test blood sugar once daily. Dx: E11.9 10/15/15   Marye Round  D Wiseman, PA-C  HYDROcodone-acetaminophen (HYCET) 7.5-325 mg/15 ml solution Take 10 mLs by mouth 4 (four) times daily as needed for moderate pain or severe pain. 12/02/15   Clayton Bibles, PA-C  metFORMIN (GLUCOPHAGE) 1000 MG tablet Take 1 tablet (1,000 mg total) by mouth 2 (two) times daily with a meal. Patient not taking: Reported on 09/29/2015 02/25/14   Jaynee Eagles, PA-C  oxyCODONE-acetaminophen (PERCOCET/ROXICET) 5-325 MG tablet Take 1 tablet by mouth every 4 (four) hours as needed for severe pain. Patient not taking: Reported on 10/10/2015 09/30/15   Charlann Lange, PA-C  saccharomyces boulardii (FLORASTOR) 250 MG capsule Take 1 capsule (250  mg total) by mouth 2 (two) times daily. 10/12/15   Thurnell Lose, MD  TRUEPLUS LANCETS 33G MISC Use 2x a day - with TrueMetrix meter Patient not taking: Reported on 09/29/2015 04/29/15   Philemon Kingdom, MD    Family History Family History  Problem Relation Age of Onset  . Heart disease Mother   . Diabetes Mother   . Hyperlipidemia Father   . Stroke Father   . Hypertension Father   . Lung cancer Father   . Cancer Paternal Grandfather     prostate    Social History Social History  Substance Use Topics  . Smoking status: Current Every Day Smoker    Packs/day: 1.00    Years: 29.00    Types: Cigarettes  . Smokeless tobacco: Never Used  . Alcohol use No     Allergies   Review of patient's allergies indicates no known allergies.   Review of Systems Review of Systems  Constitutional: Positive for diaphoresis, fatigue and fever. Negative for activity change and appetite change.  HENT: Positive for congestion, ear pain, sinus pressure and sore throat. Negative for dental problem, ear discharge and trouble swallowing.   Respiratory: Negative for cough and shortness of breath.   Gastrointestinal: Negative for abdominal pain.  Musculoskeletal: Negative for neck pain and neck stiffness.  Skin: Negative for color change and rash.  Allergic/Immunologic: Positive for immunocompromised state (diabetic ).     Physical Exam Updated Vital Signs BP 140/92 (BP Location: Right Arm)   Pulse 94   Temp 98 F (36.7 C) (Oral)   Resp 20   SpO2 99%   Physical Exam  Constitutional: He appears well-developed and well-nourished. No distress.  HENT:  Head: Normocephalic and atraumatic.  Right Ear: Tympanic membrane and ear canal normal.  Left Ear: No mastoid tenderness. Tympanic membrane is bulging.  Nose: Mucosal edema and rhinorrhea present.  Mouth/Throat: Uvula is midline. Mucous membranes are not dry. No trismus in the jaw. No dental abscesses. Posterior oropharyngeal erythema  present. No oropharyngeal exudate, posterior oropharyngeal edema or tonsillar abscesses.  Slight facial droop.  Left TM bulging and opaque, erythema.    Eyes: Left eye exhibits discharge (tearing, clear).  Neck: Normal range of motion. Neck supple.  Cardiovascular: Normal rate and regular rhythm.   Pulmonary/Chest: Effort normal and breath sounds normal. No stridor. No respiratory distress. He has no wheezes. He has no rales.  Lymphadenopathy:    He has no cervical adenopathy.  Neurological: He is alert.  Skin: He is not diaphoretic.  Nursing note and vitals reviewed.    ED Treatments / Results  Labs (all labs ordered are listed, but only abnormal results are displayed) Labs Reviewed - No data to display  EKG  EKG Interpretation None       Radiology No results found.  Procedures Procedures (including critical care time)  Medications Ordered in ED Medications - No data to display   Initial Impression / Assessment and Plan / ED Course  I have reviewed the triage vital signs and the nursing notes.  Pertinent labs & imaging results that were available during my care of the patient were reviewed by me and considered in my medical decision making (see chart for details).  Clinical Course    Afebrile, nontoxic patient with hx DM with left ear pain and left throat pain that began two weeks ago.  Clinically c/w otitis media.  No mastoid tenderness.  Given duration of illness and diabetes will treat with abx.  Pt has had left facial droop x 7 months and has been fully worked up by ENT, doubt this is related.   D/C home with amoxicillin, pain medication.  Discussed result, findings, treatment, and follow up  with patient.  Pt given return precautions.  Pt verbalizes understanding and agrees with plan.       Final Clinical Impressions(s) / ED Diagnoses   Final diagnoses:  Acute otitis media, unspecified otitis media type    New Prescriptions Discharge Medication List as of  12/02/2015  7:44 AM    START taking these medications   Details  amoxicillin (AMOXIL) 500 MG capsule Take 1 capsule (500 mg total) by mouth 3 (three) times daily., Starting Tue 12/02/2015, Print    Benzocaine 10 MG LOZG Use as directed 1 lozenge (10 mg total) in the mouth or throat as directed., Starting Tue 12/02/2015, Print    HYDROcodone-acetaminophen (HYCET) 7.5-325 mg/15 ml solution Take 10 mLs by mouth 4 (four) times daily as needed for moderate pain or severe pain., Starting Tue 12/02/2015, Print         [place], PA-C 12/02/15 [place], MD 12/02/15 1505

## 2015-12-02 NOTE — Discharge Instructions (Signed)
Read the information below.  Use the prescribed medication as directed.  Please discuss all new medications with your pharmacist.  Do not take additional tylenol while taking the prescribed pain medication to avoid overdose.  You may return to the Emergency Department at any time for worsening condition or any new symptoms that concern you.     If you develop fevers, uncontrolled ear pain, bleeding or discharge from your ear, see your doctor or return for a recheck.    °

## 2015-12-08 ENCOUNTER — Emergency Department (HOSPITAL_COMMUNITY)
Admission: EM | Admit: 2015-12-08 | Discharge: 2015-12-08 | Disposition: A | Discharge status: Home / Self Care | Payer: 59 | Attending: Emergency Medicine | Admitting: Emergency Medicine

## 2015-12-08 ENCOUNTER — Encounter (HOSPITAL_COMMUNITY): Payer: Self-pay | Admitting: Emergency Medicine

## 2015-12-08 DIAGNOSIS — F17210 Nicotine dependence, cigarettes, uncomplicated: Secondary | ICD-10-CM | POA: Diagnosis not present

## 2015-12-08 DIAGNOSIS — G510 Bell's palsy: Secondary | ICD-10-CM | POA: Diagnosis not present

## 2015-12-08 DIAGNOSIS — Z7984 Long term (current) use of oral hypoglycemic drugs: Secondary | ICD-10-CM | POA: Insufficient documentation

## 2015-12-08 DIAGNOSIS — E119 Type 2 diabetes mellitus without complications: Secondary | ICD-10-CM | POA: Diagnosis not present

## 2015-12-08 DIAGNOSIS — H9202 Otalgia, left ear: Secondary | ICD-10-CM | POA: Diagnosis not present

## 2015-12-08 MED ORDER — PREDNISONE 20 MG PO TABS
60.0000 mg | ORAL_TABLET | Freq: Once | ORAL | Status: AC
  Administered 2015-12-08: 60 mg via ORAL
  Filled 2015-12-08: qty 3

## 2015-12-08 MED ORDER — LORAZEPAM 1 MG PO TABS: 1.0000 mg | ORAL_TABLET | Freq: Every evening | ORAL | 0 refills | Status: DC | PRN

## 2015-12-08 MED ORDER — PREDNISONE 20 MG PO TABS: 40.0000 mg | ORAL_TABLET | Freq: Every day | ORAL | 0 refills | Status: DC

## 2015-12-08 MED ORDER — AZITHROMYCIN 250 MG PO TABS: 250.0000 mg | ORAL_TABLET | Freq: Every day | ORAL | 0 refills | Status: DC

## 2015-12-08 MED FILL — LORazepam 1 MG TABS: 1 | 3 days supply | Qty: 6 | Fill #0

## 2015-12-08 MED FILL — predniSONE 20 MG TABS: 20 | 5 days supply | Qty: 10 | Fill #0

## 2015-12-08 MED FILL — AZITHROMYCIN 250 MG TABLET: 250 | 5 days supply | Qty: 6 | Fill #0

## 2015-12-08 NOTE — ED Triage Notes (Signed)
Pt reports having continuing pain in left ear and throat for the last week. Pt reports taking abx for sinus infection. Pt also concernerd with anxiety and sleep deprivation.

## 2015-12-08 NOTE — Discharge Instructions (Signed)
Your blood sugar will be higher while you are on prednisone(steroids). Make sure you drink plenty of water and are taking your diabetes medications as prescribed.

## 2015-12-08 NOTE — ED Provider Notes (Signed)
[facility] DEPT Provider Note   CSN: [id number] Arrival date & time: 12/08/15  7322     History   Chief Complaint Chief Complaint  Patient presents with  . Otalgia    HPI JES COSTALES is a 40 y.o. male.  HPI   40 year old male with left-sided facial pain. Severe pain in his left ear left face behind his left eye. Patient reports frustration because of symptoms. He's been treated for left otitis media. Is significant painwhich is sharp character. Daily eyelid. Tearing.  Past Medical History:  Diagnosis Date  . Abscess   . Chicken pox   . 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 Increased frequency of headaches     Patient Active Problem List   Diagnosis Date Noted  . Scrotal abscess 10/10/2015  . Chronic pain of right lower extremity 11/16/2013  . DM (diabetes mellitus), type 2 ([place]) 04/12/2012  . Migraine headache 04/12/2012  . Soft tissue mass 04/12/2012  . Tobacco use disorder 04/12/2012    Past Surgical History:  Procedure Laterality Date  . I&D EXTREMITY Right 05/08/2015   Procedure: IRRIGATION AND DEBRIDEMENT THUMB;  Surgeon: Iran Planas, MD;  Location: [facility];  Service: Orthopedics;  Laterality: Right;  . INCISE AND DRAIN ABCESS     left hand and jaw  . IRRIGATION AND DEBRIDEMENT ABSCESS N/A 10/10/2015   Procedure: IRRIGATION AND DEBRIDEMENT SCROTAL ABSCESS;  Surgeon: Alexis Frock, MD;  Location: [facility];  Service: Urology;  Laterality: N/A;       Home Medications    Prior to Admission medications   Medication Sig Start Date End Date Taking? Authorizing Provider  acetaminophen (TYLENOL) 500 MG tablet Take 500 mg by mouth every 6 (six) hours as needed (for pain.).    Historical Provider, MD  amoxicillin (AMOXIL) 500 MG capsule Take 1 capsule (500 mg total) by mouth 3 (three) times daily. 12/02/15   Clayton Bibles, PA-C  amoxicillin-clavulanate (AUGMENTIN) 875-125 MG tablet Take 1 tablet by mouth 2 (two) times daily. 10/12/15    Thurnell Lose, MD  Benzocaine 10 MG LOZG Use as directed 1 lozenge (10 mg total) in the mouth or throat as directed. 12/02/15   Clayton Bibles, PA-C  Blood Glucose Monitoring Suppl (TRUE METRIX AIR GLUCOSE METER) w/Device KIT 1 each by Does not apply route daily. Patient not taking: Reported on 09/29/2015 04/29/15   Philemon Kingdom, MD  cyclobenzaprine (FLEXERIL) 5 MG tablet Take 1 tablet (5 mg total) by mouth 3 (three) times daily as needed for muscle spasms. Patient not taking: Reported on 09/29/2015 09/03/15   Leonie Douglas, PA-C  gemfibrozil (LOPID) 600 MG tablet Take 1 tablet (600 mg total) by mouth 2 (two) times daily before a meal. Patient not taking: Reported on 09/29/2015 02/26/14   Jaynee Eagles, PA-C  glipiZIDE (GLUCOTROL) 5 MG tablet Take 1 tablet (5 mg total) by mouth 2 (two) times daily before a meal. Patient not taking: Reported on 09/29/2015 04/29/15   Philemon Kingdom, MD  glucose blood (ACCU-CHEK AVIVA PLUS) test strip Test blood sugar once daily. Dx: E11.9 10/15/15   Leonie Douglas, PA-C  HYDROcodone-acetaminophen (HYCET) 7.5-325 mg/15 ml solution Take 10 mLs by mouth 4 (four) times daily as needed for moderate pain or severe pain. 12/02/15   Clayton Bibles, PA-C  metFORMIN (GLUCOPHAGE) 1000 MG tablet Take 1 tablet (1,000 mg total) by mouth 2 (two) times daily with a meal. Patient not taking: Reported on 09/29/2015 02/25/14  Jaynee Eagles, PA-C  oxyCODONE-acetaminophen (PERCOCET/ROXICET) 5-325 MG tablet Take 1 tablet by mouth every 4 (four) hours as needed for severe pain. Patient not taking: Reported on 10/10/2015 09/30/15   Charlann Lange, PA-C  saccharomyces boulardii (FLORASTOR) 250 MG capsule Take 1 capsule (250 mg total) by mouth 2 (two) times daily. 10/12/15   Thurnell Lose, MD  TRUEPLUS LANCETS 33G MISC Use 2x a day - with TrueMetrix meter Patient not taking: Reported on 09/29/2015 04/29/15   Philemon Kingdom, MD    Family History Family History  Problem Relation Age of Onset  .  Heart disease Mother   . Diabetes Mother   . Hyperlipidemia Father   . Stroke Father   . Hypertension Father   . Lung cancer Father   . Cancer Paternal Grandfather     prostate    Social History Social History  Substance Use Topics  . Smoking status: Current Every Day Smoker    Packs/day: 1.00    Years: 29.00    Types: Cigarettes  . Smokeless tobacco: Never Used  . Alcohol use No     Allergies   Review of patient's allergies indicates no known allergies.   Review of Systems Review of Systems  All systems reviewed and negative, other than as noted in HPI.  Physical Exam Updated Vital Signs BP (!) 143/103 (BP Location: Left Arm)   Pulse 104   Temp 98.3 F (36.8 C) (Oral)   Resp 18   Ht '5\' 8"'$  (1.727 m)   Wt 186 lb (84.4 kg)   SpO2 97%   BMI 28.28 kg/m   Physical Exam  Constitutional: He appears well-developed and well-nourished. No distress.  HENT:  Head: Normocephalic and atraumatic.  Left tympanic membrane is erythematous, but not bulging and no effusion. Mild tenderness to palpation with manipulation of the pinna. External auditory canal is clear. No mastoid changes.  Eyes: Conjunctivae are normal. Right eye exhibits no discharge. Left eye exhibits no discharge.  Neck: Neck supple.  Cardiovascular: Normal rate, regular rhythm and normal heart sounds.  Exam reveals no gallop and no friction rub.   No murmur heard. Pulmonary/Chest: Effort normal and breath sounds normal. No respiratory distress.  Abdominal: Soft. He exhibits no distension. There is no tenderness.  Musculoskeletal: He exhibits no edema or tenderness.  Neurological: He is alert.  Left eye ptosis. Perhaps some left facial weakness. Tearing in the left eye. Mild conjunctival injection. Sensation seems to be normal with a small (.  Skin: Skin is warm and dry.  Psychiatric: He has a normal mood and affect. His behavior is normal. Thought content normal.  Nursing note and vitals reviewed.    ED  Treatments / Results  Labs (all labs ordered are listed, but only abnormal results are displayed) Labs Reviewed - No data to display  EKG  EKG Interpretation None       Radiology No results found.  Procedures Procedures (including critical care time)  Medications Ordered in ED Medications - No data to display   Initial Impression / Assessment and Plan / ED Course  I have reviewed the triage vital signs and the nursing notes.  Pertinent labs & imaging results that were available during my care of the patient were reviewed by me and considered in my medical decision making (see chart for details).  Clinical Course    40 year old male with persistent L ear pain. Still some erythema of TM but no effusion/bulging. L facial nerve palsy. Weeks of symptoms. Negative MRI.  Has already seen ENT. Reports scheduled to see neurology. May benefit from steroids? Diabetic and advised that glucose will be higher. PRN ativan for sleep a couple days. Significant anxiety and trouble sleeping from pain and worrying about his symptoms. Return precautions discussed.   Final Clinical Impressions(s) / ED Diagnoses   Final diagnoses:  Facial nerve palsy  Otalgia of left ear    New Prescriptions New Prescriptions   No medications on file     Virgel Manifold, MD 12/10/15 1243

## 2015-12-09 ENCOUNTER — Ambulatory Visit (INDEPENDENT_AMBULATORY_CARE_PROVIDER_SITE_OTHER): Payer: 59 | Admitting: Neurology

## 2015-12-09 ENCOUNTER — Encounter: Payer: Self-pay | Admitting: Neurology

## 2015-12-09 DIAGNOSIS — G500 Trigeminal neuralgia: Secondary | ICD-10-CM | POA: Insufficient documentation

## 2015-12-09 MED ORDER — ACYCLOVIR 400 MG PO TABS: 800.0000 mg | ORAL_TABLET | Freq: Every day | ORAL | 0 refills | Status: DC

## 2015-12-09 MED ORDER — OXCARBAZEPINE 150 MG PO TABS: 150.0000 mg | ORAL_TABLET | Freq: Two times a day (BID) | ORAL | 6 refills | Status: DC

## 2015-12-09 MED ORDER — GABAPENTIN 300 MG PO CAPS: 600.0000 mg | ORAL_CAPSULE | Freq: Three times a day (TID) | ORAL | 6 refills | Status: DC

## 2015-12-09 MED FILL — ACYCLOVIR 400 MG TABLET: 400 | 10 days supply | Qty: 100 | Fill #0

## 2015-12-09 MED FILL — OXcarbazepine 150 MG TABS: 150 | 30 days supply | Qty: 60 | Fill #0

## 2015-12-09 MED FILL — GABAPENTIN 300 MG CAPSULE: 300 | 30 days supply | Qty: 180 | Fill #0

## 2015-12-09 NOTE — Progress Notes (Signed)
PATIENT: Edward [name] DOB: 1975/03/12  Chief Complaint  Patient presents with  . Left-sided facial pain    He is here with his wife, [name].  He developed left-sided facial pain, tingling, and drooping that has been getting progressively worse.   He has been to his PCP an ED for treatment.  He is currenlty on zithromax, prednisone and ativan.  Says his recent scans have been normal.  He has been unable to sleep at night.     HISTORICAL  Edward [name] is a 40 years old right-handed male, accompanied by his wife seen in refer by  ENT Dr. [name], primary care physician [name], initial evaluation was December 09 2015  He had a history of diabetes since 2012, reported a history of chickenpox, since beginning of 2017, he has dealt with multiple infection, first infection was right from abscess, cellulitis require surgical laceration in January 2017 and antibiotic treatment, he had right inner thigh area skin infection in August 2017, again require surgical drainage, was treated with different antibiotic with prolonged course,  Around July 2017, he began to notice left and nasalis area sensitivity to touch, there was mild skin erythematous discoloration, later he noticed left eye irritation, watering a lot, left eyelid swelling, mild left droopy eyelid, around August 2017, he has developed worsening symptoms, left ear canal pain, itchiness sensation, radiating towards left chin, left the skull area skin sensitivity, he denies significant visual loss, no hearing loss, no double vision, he denies rash broke out   Because of transient recurrent severe radiating pain from his left ear to his left throat, to his left face, he has not been slept well for few weeks,   I personally reviewed MRI of the brain with and without contrast, November 29 2015 that was normal   REVIEW OF SYSTEMS: Full 14 system review of systems performed and notable only for headache, numbness, dizziness, anxiety  not enough sleep, decreased energy, racing thoughts, insomnia, achy muscles,  ALLERGIES: No Known Allergies  HOME MEDICATIONS: Current Outpatient Prescriptions  Medication Sig Dispense Refill  . acetaminophen (TYLENOL) 500 MG tablet Take 500 mg by mouth every 6 (six) hours as needed (for pain.).    Marland Kitchen azithromycin (ZITHROMAX) 250 MG tablet Take 1 tablet (250 mg total) by mouth daily. Take first 2 tablets together, then 1 every day until finished. 6 tablet 0  . Benzocaine 10 MG LOZG Use as directed 1 lozenge (10 mg total) in the mouth or throat as directed. 30 lozenge 0  . LORazepam (ATIVAN) 1 MG tablet Take 1 tablet (1 mg total) by mouth at bedtime as needed and may repeat dose one time if needed for sleep. 6 tablet 0  . metFORMIN (GLUCOPHAGE) 1000 MG tablet Take 1 tablet (1,000 mg total) by mouth 2 (two) times daily with a meal. 180 tablet 3  . predniSONE (DELTASONE) 20 MG tablet Take 2 tablets (40 mg total) by mouth daily. 10 tablet 0   No current facility-administered medications for this visit.     PAST MEDICAL HISTORY: Past Medical History:  Diagnosis Date  . Abscess   . Chicken pox   . 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 Facial pain   . Hyperlipemia   . Increased frequency of headaches   . Migraine     PAST SURGICAL HISTORY: Past Surgical History:  Procedure Laterality Date  . I&D EXTREMITY Right 05/08/2015   Procedure: IRRIGATION AND  DEBRIDEMENT THUMB;  Surgeon: Bradly BienenstockFred Ortmann, MD;  [facility];  Service: Orthopedics;  Laterality: Right;  . INCISE AND DRAIN ABCESS     left hand and jaw  . IRRIGATION AND DEBRIDEMENT ABSCESS N/A 10/10/2015   Procedure: IRRIGATION AND DEBRIDEMENT SCROTAL ABSCESS;  Surgeon: [name], MD;  Location: [facility] ORS;  Service: Urology;  Laterality: N/A;    FAMILY HISTORY: Family History  Problem Relation Age of Onset  . Heart disease Mother   . Diabetes Mother   . Multiple sclerosis Mother   . Vascular  Disease Mother   . Hyperlipidemia Father   . Stroke Father   . Hypertension Father   . Lung cancer Father   . Diabetes Father   . Cancer Paternal Grandfather     prostate  . Heart disease Paternal Uncle     2 paternal uncles have stents    SOCIAL HISTORY:  Social History   Social History  . Marital status: Married    Spouse name: [name]  . Number of children: 0  . 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 Smoking status: Current Every Day Smoker    Packs/day: 1.00    Years: 29.00    Types: Cigarettes  . Smokeless tobacco: Never Used  . Alcohol use 0.0 oz/week     Comment: Social  . Drug use: No  . Sexual activity: Yes   Other Topics Concern  . Not on file   Social History Narrative   Lives at home with wife and sister.   Right-handed.   2 cups caffeine daily.     PHYSICAL EXAM   Vitals:   12/09/15 1331  BP: 134/89  Pulse: 88  Weight: 186 lb 8 oz (84.6 kg)  Height: 5\' 8"  (1.727 m)    Not recorded      Body mass index is 28.36 kg/m.  PHYSICAL EXAMNIATION:  Gen: NAD, conversant, well nourised, obese, well groomed                     Cardiovascular: Regular rate rhythm, no peripheral edema, warm, nontender. Eyes: Conjunctivae clear without exudates or hemorrhage Neck: Supple, no carotid bruise. Pulmonary: Clear to auscultation bilaterally   NEUROLOGICAL EXAM:  MENTAL STATUS: Speech:    Speech is normal; fluent and spontaneous with normal comprehension.  Cognition:     Orientation to time, place and person     Normal recent and remote memory     Normal Attention span and concentration     Normal Language, naming, repeating,spontaneous speech     Fund of knowledge   CRANIAL NERVES: CN II: Visual fields are full to confrontation. Fundoscopic exam is normal with sharp discs and no vascular changes. Pupils are round equal and briskly reactive to light. CN III, IV, VI:  extraocular movement are normal. No ptosis. CN V: Facial sensation is intact to pinprick in all 3 divisions bilaterally. Corneal responses are intact.  CN VII: Face is symmetric with normal eye closure and smile. CN VIII: Hearing is normal to rubbing fingers CN IX, X: Palate elevates symmetrically. Phonation is normal. CN XI: Head turning and shoulder shrug are intact CN XII: Tongue is midline with normal movements and no atrophy.  MOTOR: There is no pronator drift of out-stretched arms. Muscle bulk and tone are normal. Muscle strength is normal.  REFLEXES: Reflexes are 2+ and symmetric at the biceps, triceps, knees, and  ankles. Plantar responses are flexor.  SENSORY: Sensitivity involving left the skull, left cheek, left chin, mild skin erythematous, swelling of left upper eyelid, left cheek area  COORDINATION: Rapid alternating movements and fine finger movements are intact. There is no dysmetria on finger-to-nose and heel-knee-shin.    GAIT/STANCE: Posture is normal. Gait is steady with normal steps, base, arm swing, and turning. Heel and toe walking are normal. Tandem gait is normal.  Romberg is absent.   DIAGNOSTIC DATA (LABS, IMAGING, TESTING) - I reviewed patient records, labs, notes, testing and imaging myself where available.   ASSESSMENT AND PLAN  MADYX DELFIN is a 40 y.o. 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 Ph.D.  [facility] [address] [place], [place]  [address] Ph: [phone] Fax: [phone]  CC: ENT Dr. [name], primary care physician [name], MD

## 2015-12-10 ENCOUNTER — Encounter: Payer: Self-pay | Admitting: Neurology

## 2015-12-13 ENCOUNTER — Encounter: Payer: Self-pay | Admitting: Neurology

## 2015-12-14 ENCOUNTER — Encounter: Payer: Self-pay | Admitting: Neurology

## 2015-12-15 NOTE — Telephone Encounter (Signed)
I have called and failed to reach patient by cell and home phone, left message, please try to reach him again to check on his response medications

## 2015-12-16 ENCOUNTER — Telehealth: Payer: Self-pay | Admitting: Neurology

## 2015-12-16 NOTE — Telephone Encounter (Signed)
I was able to talk with him He is now taking gabapentin 300mg  2 tabs tid at 6am, 12, 6pm, trileptal 150mg  at 9am, 9pm, he goes to sleep at 10pm,  He can sleep through the day, he is also taking acyclovir 400mg  2 tab 5 times a day.  He noticed worsening throat, jaw pain, still has significant left nasal, cheek skin sensitivity, pain,  I have advised him take Trileptal 150 mg in the morning when he first got up, 1 tablet before he go to sleep, and gabapentin 300 mg 2 tablets before he goes to sleep, 1-2 tablets 3 times a day during the daytime.  He may stay within the limits of gabapentin 300 mg less than 12 tablets in each day,  Call or email clinic for progress

## 2015-12-16 NOTE — Telephone Encounter (Signed)
Pt's wife called said he is sleeping all the time. She said he is depressed and wants to go to work. She said he is in a lot of pain. She said he started taking OXcarbazepine (TRILEPTAL) 150 MG tablet on Saturday 12/13/15 and the gabapentin 12/09/15. Please call

## 2015-12-16 NOTE — Telephone Encounter (Signed)
From Dr. Terrace ArabiaYan: I was able to talk with him He is now taking gabapentin 300mg  2 tabs tid at 6am, 12, 6pm, trileptal 150mg  at 9am, 9pm, he goes to sleep at 10pm,  He can sleep through the day, he is also taking acyclovir 400mg  2 tab 5 times a day.  He noticed worsening throat, jaw pain, still has significant left nasal, cheek skin sensitivity, pain,  I have advised him take Trileptal 150 mg in the morning when he first got up, 1 tablet before he go to sleep, and gabapentin 300 mg 2 tablets before he goes to sleep, 1-2 tablets 3 times a day during the daytime.  He may stay within the limits of gabapentin 300 mg less than 12 tablets in each day,  Call or email clinic for progress

## 2015-12-18 NOTE — Telephone Encounter (Signed)
Spoke to his wife, Deirdre - she is aware Dr. Terrace ArabiaYan prescribed Zovirax as a 10-day supply.  He has made the recommended changes to his medication schedule.  He is still having pain during the day but is less sleepy.  He has developed a rash on the inside his mouth, on the left side and around his left ear. He has an appt on 12/23/15.

## 2015-12-18 NOTE — Telephone Encounter (Signed)
Left message for a return call

## 2015-12-18 NOTE — Telephone Encounter (Signed)
Pt's wife called to advise he has 2 days left of acyclovir (ZOVIRAX) 400 MG tablet . Does he need to continue this medication and if so he will need refill. She also advised he is breaking out in blisters inside the mouth on the left side. And also starting to break out with a rash towards the front of the ear on the left side and the ear pain is getting worse.  She can be reached [phone] Pt's wife said "Dr Terrace ArabiaYan is very empathetic and is doing a great job tweeking his medication. She said Dr Terrace ArabiaYan is the best! Everyone at your office is so nice". I thanked her. Pt has appt on 10/24

## 2015-12-23 ENCOUNTER — Ambulatory Visit (INDEPENDENT_AMBULATORY_CARE_PROVIDER_SITE_OTHER): Payer: 59 | Admitting: Neurology

## 2015-12-23 ENCOUNTER — Encounter: Payer: Self-pay | Admitting: Neurology

## 2015-12-23 VITALS — BP 132/94 | HR 99 | Ht 68.0 in | Wt 186.0 lb

## 2015-12-23 DIAGNOSIS — G500 Trigeminal neuralgia: Secondary | ICD-10-CM

## 2015-12-23 DIAGNOSIS — G43009 Migraine without aura, not intractable, without status migrainosus: Secondary | ICD-10-CM | POA: Diagnosis not present

## 2015-12-23 MED ORDER — GABAPENTIN 300 MG PO CAPS: 900.0000 mg | ORAL_CAPSULE | Freq: Four times a day (QID) | ORAL | 11 refills | Status: DC

## 2015-12-23 MED ORDER — BACLOFEN 10 MG PO TABS: 10.0000 mg | ORAL_TABLET | Freq: Three times a day (TID) | ORAL | 6 refills | Status: DC

## 2015-12-23 MED ORDER — OXCARBAZEPINE 150 MG PO TABS: 300.0000 mg | ORAL_TABLET | Freq: Two times a day (BID) | ORAL | 11 refills | Status: DC

## 2015-12-23 MED FILL — GABAPENTIN 300 MG CAPSULE: 300 | 30 days supply | Qty: 360 | Fill #0

## 2015-12-23 MED FILL — OXcarbazepine 150 MG TABS: 150 | 30 days supply | Qty: 120 | Fill #0

## 2015-12-23 MED FILL — BACLOFEN 10 MG TABLET: 10 | 30 days supply | Qty: 90 | Fill #0

## 2015-12-23 NOTE — Progress Notes (Signed)
PATIENT: Edward [name] DOB: [birth year]  Chief Complaint  Patient presents with  . Trigeminal Neuralgia    He is here with his wife, Edward [name].  He is still having significant pain, despite his current medications. He has developed blisters on the back of this throat and a mild, facial rash.  He has also noticed mild tremors in his bilateral hands.  . Depression    He has felt depressed since having to manage this intense pain and from being out of work.     HISTORICAL  NOMAR BROAD is a 40 years old right-handed male, accompanied by his wife seen in refer by  ENT Dr. [name], primary care physician [name], initial evaluation was December 09 2015  He had a history of diabetes since 2012, reported a history of chickenpox, since beginning of 2017, he has dealt with multiple infection, first infection was right from abscess, cellulitis require surgical laceration in January 2017 and antibiotic treatment, he had right inner thigh area skin infection in August 2017, again require surgical drainage, was treated with different antibiotic with prolonged course,  Around July 2017, he began to notice left and nasalis area sensitivity to touch, there was mild skin erythematous discoloration, later he noticed left eye irritation, watering a lot, left eyelid swelling, mild left droopy eyelid, around August 2017, he has developed worsening symptoms, left ear canal pain, itchiness sensation, radiating towards left chin, left the skull area skin sensitivity, he denies significant visual loss, no hearing loss, no double vision, he denies rash broke out   Because of transient recurrent severe radiating pain from his left ear to his left throat, to his left face, he has not been slept well for few weeks,   I personally reviewed MRI of the brain with and without contrast, November 29 2015 that was normal   UPDATE Oct 24th 2017: He has finished his ten day course of acycloriv 400mg  2 tabs x5 times  a day.  He is now taking carbamazepine 150 mg twice a day, gabapentin 300 mg 2 tablets 4 times a day, he can sleep up to 5 hours in stretch, but still weak up almost every night because of left facial pain, hypersensitivity, also noticed  blisters broken out at his left side of face.   We have reviewed MRI brain w/wo on Sept 30 2017: MRI brain w/wo was normal.  REVIEW OF SYSTEMS: Full 14 system review of systems performed and notable only for headache, numbness, dizziness, anxiety not enough sleep, decreased energy, racing thoughts, insomnia, achy muscles,  ALLERGIES: No Known Allergies  HOME MEDICATIONS: Current Outpatient Prescriptions  Medication Sig Dispense Refill  . acetaminophen (TYLENOL) 500 MG tablet Take 500 mg by mouth every 6 (six) hours as needed (for pain.).    Marland Kitchen gabapentin (NEURONTIN) 300 MG capsule Take 2 capsules (600 mg total) by mouth 3 (three) times daily. 180 capsule 6  . metFORMIN (GLUCOPHAGE) 1000 MG tablet Take 1 tablet (1,000 mg total) by mouth 2 (two) times daily with a meal. 180 tablet 3  . OXcarbazepine (TRILEPTAL) 150 MG tablet Take 1 tablet (150 mg total) by mouth 2 (two) times daily. 60 tablet 6   No current facility-administered medications for this visit.     PAST MEDICAL HISTORY: Past Medical History:  Diagnosis Date  . Abscess   . Chicken pox   . 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 Facial pain   .  Hyperlipemia   . Increased frequency of headaches   . Migraine     PAST SURGICAL HISTORY: Past Surgical History:  Procedure Laterality Date  . I&D EXTREMITY Right 05/08/2015   Procedure: IRRIGATION AND DEBRIDEMENT THUMB;  Surgeon: Bradly BienenstockFred Ortmann, MD;  Location: [facility] OR;  Service: Orthopedics;  Laterality: Right;  . INCISE AND DRAIN ABCESS     left hand and jaw  . IRRIGATION AND DEBRIDEMENT ABSCESS N/A 10/10/2015   Procedure: IRRIGATION AND DEBRIDEMENT SCROTAL ABSCESS;  Surgeon: [name], MD;  Location: [facility];   Service: Urology;  Laterality: N/A;    FAMILY HISTORY: Family History  Problem Relation Age of Onset  . Heart disease Mother   . Diabetes Mother   . Multiple sclerosis Mother   . Vascular Disease Mother   . Hyperlipidemia Father   . Stroke Father   . Hypertension Father   . Lung cancer Father   . Diabetes Father   . Cancer Paternal Grandfather     prostate  . Heart disease Paternal Uncle     2 paternal uncles have stents    SOCIAL HISTORY:  Social History   Social History  . Marital status: Married    Spouse name: [name]  . Number of children: 0  . 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 Smoking status: Current Every Day Smoker    Packs/day: 1.00    Years: 29.00    Types: Cigarettes  . Smokeless tobacco: Never Used  . Alcohol use 0.0 oz/week     Comment: Social  . Drug use: No  . Sexual activity: Yes   Other Topics Concern  . Not on file   Social History Narrative   Lives at home with wife and sister.   Right-handed.   2 cups caffeine daily.     PHYSICAL EXAM   Vitals:   12/23/15 1210  BP: (!) 132/94  Pulse: 99  Weight: 186 lb (84.4 kg)  Height: 5\' 8"  (1.727 m)    Not recorded      Body mass index is 28.28 kg/m.  PHYSICAL EXAMNIATION:  Gen: NAD, conversant, well nourised, obese, well groomed                     Cardiovascular: Regular rate rhythm, no peripheral edema, warm, nontender. Eyes: Conjunctivae clear without exudates or hemorrhage Neck: Supple, no carotid bruise. Pulmonary: Clear to auscultation bilaterally   NEUROLOGICAL EXAM:  MENTAL STATUS: Speech:    Speech is normal; fluent and spontaneous with normal comprehension.  Cognition:     Orientation to time, place and person     Normal recent and remote memory     Normal Attention span and concentration     Normal Language, naming, repeating,spontaneous speech     Fund of knowledge   CRANIAL  NERVES: CN II: Visual fields are full to confrontation. Fundoscopic exam is normal with sharp discs and no vascular changes. Pupils are round equal and briskly reactive to light. CN III, IV, VI: extraocular movement are normal. No ptosis. CN V: Facial sensation is intact to pinprick in all 3 divisions bilaterally. Corneal responses are intact.  CN VII: Face is symmetric with normal eye closure and smile. CN VIII: Hearing is normal to rubbing fingers CN IX, X: Palate elevates symmetrically. Phonation is normal. CN XI: Head turning and shoulder shrug are intact CN XII: Tongue is midline with normal movements  and no atrophy.  MOTOR: There is no pronator drift of out-stretched arms. Muscle bulk and tone are normal. Muscle strength is normal.  REFLEXES: Reflexes are 2+ and symmetric at the biceps, triceps, knees, and ankles. Plantar responses are flexor.  SENSORY: Sensitivity involving left the skull, left cheek, left chin, mild skin erythematous, swelling of left upper eyelid, left cheek area  COORDINATION: Rapid alternating movements and fine finger movements are intact. There is no dysmetria on finger-to-nose and heel-knee-shin.    GAIT/STANCE: Posture is normal. Gait is steady with normal steps, base, arm swing, and turning. Heel and toe walking are normal. Tandem gait is normal.  Romberg is absent.   DIAGNOSTIC DATA (LABS, IMAGING, TESTING) - I reviewed patient records, labs, notes, testing and imaging myself where available.   ASSESSMENT AND PLAN  CRIST KRUSZKA is a 40 y.o. 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 I have suggested him to take higher dose of gabapentin 300 mg up to 12 tablets daily Higher dose of Trileptal 150 mg 2 tablets twice a day Add on baclofen 10 mg 3 times a day   Levert Feinstein, M.D. Ph.D.  [facility] [address] [place], [place] [address] Ph: [phone] Fax: [phone]  CC: ENT Dr. [name], primary care physician [name], MD

## 2015-12-25 ENCOUNTER — Encounter: Payer: Self-pay | Admitting: Family Medicine

## 2015-12-26 ENCOUNTER — Telehealth: Payer: Self-pay | Admitting: Neurology

## 2015-12-26 NOTE — Telephone Encounter (Signed)
Wife called in that pt still has pain on the left face, did not seem improving. At meantime, his twitching of hands getting worse, more on the left hand but also on right hand. Spilling everything such as coffee and water. Also dropping things in hands. Sleeping most of the time for the last 2 days, and when woke up, he hold his left face due to pain. He is on gabapentin 900mg  Qid and trileptal 300mg  bid and baclofen 10mg  tid.   I told wife that I am not sure if the twitching of hands is tremor or ataxia or asterixis, but seems he is on high dose of gabapentin which can have side effects of tremor and ataxia. Of course, trileptal can do the same thing. However, his trileptal dose is still low.   Given his trigeminal neuralgia not in control, I recommend to increase trileptal to 450mg  bid and then decrease gabapentin to 600mg  Qid to see if this helps for the side effect but able to gain control of trigeminal neuralgia.  I encourage her to call back on weekends to report the changes. She expressed understanding and appreciation.  Marvel PlanJindong Haisley Arens, MD PhD Stroke Neurology 12/26/2015 3:05 PM

## 2015-12-29 ENCOUNTER — Encounter: Payer: Self-pay | Admitting: *Deleted

## 2015-12-29 ENCOUNTER — Other Ambulatory Visit: Payer: Self-pay | Admitting: *Deleted

## 2015-12-29 MED ORDER — TRAZODONE HCL 50 MG PO TABS: 50.0000 mg | ORAL_TABLET | Freq: Every day | ORAL | 1 refills | Status: DC

## 2015-12-29 MED ORDER — CLONAZEPAM 0.5 MG PO TABS: ORAL_TABLET | ORAL | 1 refills | Status: DC

## 2015-12-29 MED FILL — clonazePAM 0.5 MG TABS: 0.5 | 30 days supply | Qty: 30 | Fill #0

## 2015-12-29 NOTE — Telephone Encounter (Signed)
Would you please check on him

## 2015-12-29 NOTE — Telephone Encounter (Signed)
He made the recommended changes to his medications.  He is still having pain that is especially interfering with his sleep.  His tremors are still present but have improved.  Per Dr. Terrace ArabiaYan, provide rx for clonazepam 0.5mg , 1-2, qhs prn and trazodone 50mg , qhs.  Continue other medications as instructed below.  He is agreeable to this plan and prescriptions have been sent to his pharmacy.

## 2015-12-31 ENCOUNTER — Encounter: Payer: Self-pay | Admitting: Neurology

## 2016-01-05 ENCOUNTER — Encounter: Payer: Self-pay | Admitting: Neurology

## 2016-01-06 ENCOUNTER — Telehealth: Payer: Self-pay | Admitting: Neurology

## 2016-01-06 MED ORDER — NORTRIPTYLINE HCL 50 MG PO CAPS: 100.0000 mg | ORAL_CAPSULE | Freq: Every day | ORAL | 11 refills | Status: DC

## 2016-01-06 MED FILL — NORTRIPTYLINE HCL 50 MG CAP: 50 | 30 days supply | Qty: 60 | Fill #0

## 2016-01-06 NOTE — Telephone Encounter (Signed)
Left message for a return call

## 2016-01-06 NOTE — Telephone Encounter (Signed)
Left another message requesting a return call.  Also, tried his wife at the work number provided on [employer] - phone rang repeatedly with no answer or machine.

## 2016-01-06 NOTE — Telephone Encounter (Signed)
I have suggested nortriptyline 50 mg may take up to 2 tablets every night for neuropathic pain, hope this will help him sleep as well, may stop trazodone,   Also check on him to see if baclofen has helped him or not, if not may consider stop, if he is truly help him may consider higher dose,  Keep Trileptal 150 mg 3 tablets twice a day for right now, and gabapentin 300 mg 2 tablets 4 times a day,  I asked him to call back in a few days if he continue have significant pain, difficulty sleeping,

## 2016-01-06 NOTE — Telephone Encounter (Signed)
Left third message for a return call.

## 2016-01-07 ENCOUNTER — Other Ambulatory Visit: Payer: Self-pay | Admitting: *Deleted

## 2016-01-07 ENCOUNTER — Encounter: Payer: Self-pay | Admitting: *Deleted

## 2016-01-07 MED ORDER — FENTANYL 50 MCG/HR TD PT72: 50.0000 ug | MEDICATED_PATCH | TRANSDERMAL | 0 refills | Status: DC

## 2016-01-07 MED FILL — fentaNYL 50 MCG/HR PT72: 50 | 30 days supply | Qty: 10 | Fill #0

## 2016-01-07 NOTE — Telephone Encounter (Signed)
Left message for a return call

## 2016-01-07 NOTE — Telephone Encounter (Signed)
Spoke to patient - his pain is worse at night and he is not sleeping well.  Baclofen is no longer helpful.  His pain is worse.  The following changes have been made by Dr. Terrace ArabiaYan:  1) stop baclofen 2) stop trazodone 3) start nortriptyline 50mg , up to two tablets qhs 4) start Fentanyl 50mcg patch, change patch q72h 5) continue dose of Trileptal 6) continue dose of gabapentin  He verbalized understanding of new treatment plan.  I also spoke to his wife, Nance PewDiedra, who also expressed understanding of changes.  They will call with any concerns.

## 2016-01-07 NOTE — Telephone Encounter (Signed)
Pt's wife called back. She will be available until 1:30 today at the work number. She will be available again around at 5 today [phone].

## 2016-01-07 NOTE — Telephone Encounter (Addendum)
Attempted his wife's work number again.  Left message for her to call back.  Please interrupt me, if either patient or wife, calls back.

## 2016-01-13 ENCOUNTER — Telehealth: Payer: Self-pay | Admitting: Neurology

## 2016-01-13 NOTE — Telephone Encounter (Signed)
Pt's wife called in about getting office notes and notes about medication changes for SSI. Please call and advise [phone]

## 2016-01-13 NOTE — Telephone Encounter (Signed)
Spoke to patient - he will be coming by our office to sign a medical release form.  He needs his records for his disability meeting with social security.

## 2016-01-14 ENCOUNTER — Encounter: Payer: Self-pay | Admitting: Neurology

## 2016-01-21 MED FILL — OXcarbazepine 150 MG TABS: 150 | 30 days supply | Qty: 120 | Fill #1

## 2016-01-21 MED FILL — GABAPENTIN 300 MG CAPSULE: 300 | 30 days supply | Qty: 360 | Fill #1

## 2016-01-26 ENCOUNTER — Ambulatory Visit: Payer: 59 | Admitting: Neurology

## 2016-01-26 ENCOUNTER — Encounter: Payer: Self-pay | Admitting: Neurology

## 2016-02-02 MED FILL — NORTRIPTYLINE HCL 50 MG CAP: 50 | 30 days supply | Qty: 60 | Fill #1

## 2016-02-02 MED FILL — clonazePAM 0.5 MG TABS: 0.5 | 30 days supply | Qty: 30 | Fill #1

## 2016-02-03 ENCOUNTER — Other Ambulatory Visit: Payer: Self-pay | Admitting: *Deleted

## 2016-02-03 ENCOUNTER — Encounter: Payer: Self-pay | Admitting: Neurology

## 2016-02-03 ENCOUNTER — Telehealth: Payer: Self-pay | Admitting: Neurology

## 2016-02-03 MED ORDER — FENTANYL 50 MCG/HR TD PT72: 50.0000 ug | MEDICATED_PATCH | TRANSDERMAL | 0 refills | Status: DC

## 2016-02-04 NOTE — Telephone Encounter (Signed)
Spoke to patient - he place two of his patches on early this past month.  He is currently wearing his last patch.  Dr. Terrace ArabiaYan is unable to authorize an early refill of his opioid.  He will leave his current patch on through tomorrow and be at the pharmacy when they open on the 8th for his next prescription. I reminded him to date his patches so he does not get confused when to change them.  He verbalized understanding.

## 2016-02-04 NOTE — Telephone Encounter (Signed)
Darel HongJudy with [facility] is calling to see if the patient's fentaNYL (DURAGESIC - DOSED MCG/HR) 50 MCG/HR can be filled today instead of 02-06-16.

## 2016-02-04 NOTE — Telephone Encounter (Signed)
Pt's wife called back wanting to know if RX could be approved today

## 2016-02-06 MED FILL — fentaNYL 50 MCG/HR PT72: 50 | 30 days supply | Qty: 10 | Fill #0

## 2016-02-09 ENCOUNTER — Ambulatory Visit (INDEPENDENT_AMBULATORY_CARE_PROVIDER_SITE_OTHER): Payer: 59 | Admitting: Family Medicine

## 2016-02-09 VITALS — BP 122/72 | HR 118 | Temp 98.1°F | Resp 17 | Ht 68.5 in | Wt 179.0 lb

## 2016-02-09 DIAGNOSIS — R21 Rash and other nonspecific skin eruption: Secondary | ICD-10-CM | POA: Diagnosis not present

## 2016-02-09 MED ORDER — CLOTRIMAZOLE-BETAMETHASONE 1-0.05 % EX CREA: 1.0000 "application " | TOPICAL_CREAM | Freq: Two times a day (BID) | CUTANEOUS | 0 refills | Status: DC

## 2016-02-09 MED ORDER — FLUCONAZOLE 150 MG PO TABS: 150.0000 mg | ORAL_TABLET | Freq: Every day | ORAL | 0 refills | Status: DC

## 2016-02-09 MED FILL — FLUCONAZOLE 150 MG TABLET: 150 | 7 days supply | Qty: 7 | Fill #0

## 2016-02-09 NOTE — Progress Notes (Signed)
Patient ID: Edward [name], male    DOB: 02/23/1976, 40 y.o.   MRN: [id number]  PCP: No primary care provider on file.  Chief Complaint  Patient presents with  . patient states he has a yeast infection    Subjective:   HPI 40 year old male presents for evaluation penile rash x two weeks. Pt is [facility] to [facility], although presents newly to me. Chronic problems include Type 2 Diabetes, hx of scrotal abscess, and tobacco use. Reports taking multiple antibiotics this year. Over the last two weeks developing significant burning and itching. Denies burning with urination. Red irritation of penile head and beneath the foreskin of his uncircumcised penis. He reports only being sexually active with his wife. He has used over the counter anti-fungals without relief of symptoms  Social History   Social History  . Marital status: Married    Spouse name: [name]  . Number of children: 0  . 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 Smoking status: Current Every Day Smoker    Packs/day: 1.00    Years: 31.00    Types: Cigarettes  . Smokeless tobacco: Never Used  . Alcohol use 0.0 oz/week     Comment: Social  . Drug use: No  . Sexual activity: Yes   Other Topics Concern  . Not on file   Social History Narrative   Lives at home with wife and sister.   Right-handed.   2 cups caffeine daily.    Family History  Problem Relation Age of Onset  . Heart disease Mother   . Diabetes Mother   . Multiple sclerosis Mother   . Vascular Disease Mother   . Hyperlipidemia Father   . Stroke Father   . Hypertension Father   . Lung cancer Father   . Diabetes Father   . Cancer Paternal Grandfather     prostate  . Heart disease Paternal Uncle     2 paternal uncles have stents   Review of Systems  See HPI Patient Active Problem List   Diagnosis Date Noted  . Trigeminal neuralgia pain 12/09/2015  . Scrotal abscess 10/10/2015   . Chronic pain of right lower extremity 11/16/2013  . DM (diabetes mellitus), type 2 (HCC) 04/12/2012  . Migraine headache 04/12/2012  . Soft tissue mass 04/12/2012  . Tobacco use disorder 04/12/2012     Prior to Admission medications   Medication Sig Start Date End Date Taking? Authorizing Provider  acetaminophen (TYLENOL) 500 MG tablet Take 500 mg by mouth every 6 (six) hours as needed (for pain.).   Yes Historical Provider, MD  clonazePAM (KLONOPIN) 0.5 MG tablet Take 1-2 at bedtime, as needed. #30 for 30 days. 12/29/15  Yes Levert FeinsteinYijun Yan, MD  fentaNYL (DURAGESIC - DOSED MCG/HR) 50 MCG/HR Place 1 patch (50 mcg total) onto the skin every 3 (three) days. 02/03/16  Yes Levert FeinsteinYijun Yan, MD  gabapentin (NEURONTIN) 300 MG capsule Take 3 capsules (900 mg total) by mouth 4 (four) times daily. 12/23/15  Yes Levert FeinsteinYijun Yan, MD  metFORMIN (GLUCOPHAGE) 1000 MG tablet Take 1 tablet (1,000 mg total) by mouth 2 (two) times daily with a meal. 02/25/14  Yes Wallis BambergMario Mani, PA-C  nortriptyline (PAMELOR) 50 MG capsule Take 2 capsules (100 mg total) by mouth at bedtime. 01/06/16  Yes Levert FeinsteinYijun Yan, MD  OXcarbazepine (TRILEPTAL) 150 MG tablet Take 2 tablets (300 mg total) by mouth 2 (two) times daily.  12/23/15  Yes Levert FeinsteinYijun Yan, MD   No Known Allergies     Objective:  Physical Exam  Constitutional: He is oriented to person, place, and time. He appears well-developed and well-nourished.  HENT:  Head: Normocephalic and atraumatic.  Eyes: Conjunctivae are normal. Pupils are equal, round, and reactive to light.  Neck: Normal range of motion. Neck supple.  Pulmonary/Chest: Effort normal.  Genitourinary: Penile tenderness present.  Genitourinary Comments: Excoriated lesions head of penis.  Uncircumcised penis, beneath foreskin white thick discharge. Culture of discharge open lesion of rash obtained.  Neurological: He is alert and oriented to person, place, and time.  Skin: Skin is warm and dry.  Psychiatric: He has a normal mood  and affect. His behavior is normal. Judgment and thought content normal.    Vitals:   02/09/16 0802  BP: 122/72  Pulse: (!) 118  Resp: 17  Temp: 98.1 F (36.7 C)   Assessment & Plan:  1. Penile rash - WOUND CULTURE PLAN: . fluconazole (DIFLUCAN) 150 MG tablet    Sig: Take 1 tablet (150 mg total) by mouth daily.    Dispense:  7 tablet    Refill:  0  . clotrimazole-betamethasone (LOTRISONE) cream    Sig: Apply 1 application topically 2 (two) times daily.    Dispense:  30 g   Return for follow-up in 10 days if infection doesn't resolve.  Godfrey PickKimberly S. Tiburcio PeaHarris, MSN, FNP-C [facility]

## 2016-02-09 NOTE — Addendum Note (Signed)
Addended by: Bing NeighborsHARRIS, Tashara Suder S on: 02/09/2016 12:01 PM   Modules accepted: Orders

## 2016-02-09 NOTE — Patient Instructions (Addendum)
Take 1 Diflucan daily for a total of 7 days for genital yeast infection.  Apply Lotrisone cream to affected lesion.  Return for care in 10 days if infection has not cleared.  IF you received an x-ray today, you will receive an invoice from [facility]. Please contact [facility] at [phone] with questions or concerns regarding your invoice.   IF you received labwork today, you will receive an invoice from [facility]. Please contact [facility] at [phone] with questions or concerns regarding your invoice.   Our billing staff will not be able to assist you with questions regarding bills from these companies.  You will be contacted with the lab results as soon as they are available. The fastest way to get your results is to activate your My Chart account. Instructions are located on the last page of this paperwork. If you have not heard from us regarding the results in 2 weeks, please contact this office.     Genital Yeast Infection, Male Introduction In men, a genital yeast infection is a condition that causes soreness, swelling, and redness (inflammation) of the head of the penis (glans penis). This type of infection is also called balanitis. A genital yeast infection can be spread through sexual contact, but it can also develop without sexual contact. If the infection is not treated properly, it is likely to come back. What are the causes? This condition is caused by a change in the normal balance of the yeast and bacteria that live on the skin. This change causes an overgrowth of yeast, which causes the inflammation. Many types of yeast can cause this infection, but candida is the most common. What increases the risk? This condition is more likely to develop in a man if:  He takes antibiotic medicines.  He has diabetes.  He is exposed to the infection by a sexual partner.  He is not circumcised.  He has a weak defense (immune)  system.  He has been taking steroid medicines for a long time.  He has poor hygiene. What are the signs or symptoms? Symptoms of this condition include:  Itching.  Dry, red, or cracked skin on the penis.  Swelling.  Pain while urinating or difficulty urinating.  Thick, bad-smelling discharge on the penis. How is this diagnosed? This condition is diagnosed with a medical history and physical exam. Your health care provider may examine a sample of any discharge from the penis and send the sample for testing. You may also have tests, including:  A urine test to rule out a urinary tract infection (UTI).  A blood test to rule out diabetes. How is this treated? This condition is treated with antifungal cream or pills along with self-care at home. Antifungal medicines may be prescribed to you or they may be available over-the-counter. For men who are not circumcised, circumcision may be recommended to control infections that return and are difficult to treat. Follow these instructions at home:  Take or apply over-the-counter and prescription medicines only as told by your health care provider.  Do not have sex until your health care provider has approved. Tell your sexual partner that you have a yeast infection. That person should go to his or her health care provider if he or she develops symptoms.  Eat more yogurt. This may help to keep your yeast infection from returning.  Wash your penis with soap and water every day. If you are not circumcised, pull back the foreskin to wash. Make sure to  dry your penis completely after washing.  Wear breathable, cotton underwear.  Keep your underwear clean and dry.  If you have diabetes, keep your blood sugar levels under strict control. Contact a health care provider if:  You have a fever.  Your symptoms go away and then return.  Your symptoms do not get better with treatment.  Your symptoms get worse.  You have new symptoms. Get  help right away if:  Your swelling and inflammation become so severe that you cannot urinate. This information is not intended to replace advice given to you by your health care provider. Make sure you discuss any questions you have with your health care provider. Document Released: 03/25/2004 Document Revised: 07/24/2015 Document Reviewed: 08/19/2014  2017 Elsevier

## 2016-02-10 ENCOUNTER — Encounter: Payer: Self-pay | Admitting: Neurology

## 2016-02-12 LAB — WOUND CULTURE

## 2016-02-17 ENCOUNTER — Encounter: Payer: Self-pay | Admitting: Neurology

## 2016-02-19 ENCOUNTER — Ambulatory Visit: Payer: Self-pay | Admitting: Neurology

## 2016-02-19 MED FILL — OXcarbazepine 150 MG TABS: 150 | 30 days supply | Qty: 120 | Fill #2

## 2016-03-06 ENCOUNTER — Encounter: Payer: Self-pay | Admitting: Neurology

## 2016-03-08 ENCOUNTER — Other Ambulatory Visit: Payer: Self-pay | Admitting: *Deleted

## 2016-03-08 MED ORDER — FENTANYL 50 MCG/HR TD PT72: 50.0000 ug | MEDICATED_PATCH | TRANSDERMAL | 0 refills | Status: DC

## 2016-03-08 MED FILL — fentaNYL 50 MCG/HR PT72: 50 | 30 days supply | Qty: 10 | Fill #0

## 2016-03-08 MED FILL — CLOTRIMAZOLE-BETAMETHASONE: 1-0.05 | 30 days supply | Qty: 30 | Fill #0

## 2016-03-11 ENCOUNTER — Ambulatory Visit (INDEPENDENT_AMBULATORY_CARE_PROVIDER_SITE_OTHER): Payer: 59 | Admitting: Neurology

## 2016-03-11 ENCOUNTER — Encounter: Payer: Self-pay | Admitting: Neurology

## 2016-03-11 VITALS — BP 130/85 | HR 106 | Ht 68.5 in | Wt 176.0 lb

## 2016-03-11 DIAGNOSIS — Z79899 Other long term (current) drug therapy: Secondary | ICD-10-CM | POA: Diagnosis not present

## 2016-03-11 DIAGNOSIS — G43009 Migraine without aura, not intractable, without status migrainosus: Secondary | ICD-10-CM | POA: Diagnosis not present

## 2016-03-11 DIAGNOSIS — G500 Trigeminal neuralgia: Secondary | ICD-10-CM

## 2016-03-11 MED ORDER — CLONAZEPAM 0.5 MG PO TABS: ORAL_TABLET | ORAL | 3 refills | Status: DC

## 2016-03-11 MED ORDER — RIZATRIPTAN BENZOATE 10 MG PO TBDP: 10.0000 mg | ORAL_TABLET | ORAL | 6 refills | Status: DC | PRN

## 2016-03-11 MED FILL — clonazePAM 0.5 MG TABS: 0.5 | 30 days supply | Qty: 30 | Fill #0

## 2016-03-11 MED FILL — RIZATRIPTAN 10 MG ODT: 10 | 30 days supply | Qty: 15 | Fill #0

## 2016-03-11 MED FILL — NORTRIPTYLINE HCL 50 MG CAP: 50 | 30 days supply | Qty: 60 | Fill #2

## 2016-03-11 NOTE — Patient Instructions (Signed)
If you symptoms has improved,   Will try to taper off gabapentin to lower dose first, Then fentanyl patch to lower dose, Then Klonopin  Will keep Trileptal 150 mg 2 tablets twice a day, nortriptyline 50 mg 2 tablets every night at current dose.

## 2016-03-11 NOTE — Progress Notes (Signed)
PATIENT: Edward [name] DOB: 02/12/1976  Chief Complaint  Patient presents with  . Trigeminal Neuralgia    He is here with his wife, Deirdre.  Feels his pain is tolerable with gabapentin 900mg , 3-4 times daily and Fentanyl patches.  States by the third day of wearing the patch, he notices his face starts to pull.  He is concerned about his decreased concentration and increased agitation. He has also noticed decreased libido and a change in his taste sensations.  . Migraine    Reports having 3-4 migraines per month.  He has taken sumatriptan injections in the past that was helpful.      HISTORICAL  Edward [name] is a 41 years old right-handed male, accompanied by his wife seen in refer by  ENT Dr. [name], primary care physician [name], initial evaluation was December 09 2015  He had a history of diabetes since 2012, reported a history of chickenpox, since beginning of 2017, he has dealt with multiple infection, first infection was right from abscess, cellulitis require surgical laceration in January 2017 and antibiotic treatment, he had right inner thigh area skin infection in August 2017, again require surgical drainage, was treated with different antibiotic with prolonged course,  Around July 2017, he began to notice left nasalis area sensitivity to touch, there was mild skin erythematous discoloration, later he noticed left eye irritation, watering a lot, left eyelid swelling, mild left droopy eyelid, around August 2017, he has developed worsening symptoms, left ear canal pain, itchiness sensation, radiating towards left chin, left the skull area skin sensitivity, he denies significant visual loss, no hearing loss, no double vision, he denies rash broke out   Because of transient recurrent severe radiating pain from his left ear to his left throat, to his left face, he has not been slept well for few weeks,   I personally reviewed MRI of the brain with and without  contrast, November 29 2015 that was normal   UPDATE Oct 24th 2017: He has finished his ten days course of acycloriv 400mg  2 tabs x5 times a day.  He is now taking carbamazepine 150 mg twice a day, gabapentin 300 mg 2 tablets 4 times a day, he can sleep up to 5 hours in stretch, but still weak up almost every night because of left facial pain, hypersensitivity, also noticed  blisters broken out at his left side of face.   We have reviewed MRI brain w/wo on Sept 30 2017: MRI brain w/wo was normal.  UPDATE Mar 11 2016:  He is now using fetanyl patch 50 mcg q 72 days, also gabapentin 300mg  3 tid, trileptal 150mg  ii bid, nortriptyline 50 mg 2 tablets every night, klonipin 0.5mg  1-2 tabs qhs,   He can sleep through night, occasionally difficulty sleeping, also complains of memory loss, he complains of increased pain after 2nd day,   He still has depression, "always glass half full",  He feel depressed that he could not work.   He now complains of frequent migraines, hollow cranial severe pounding headaches, with associated light noise sensitive, lasting for few hours,   REVIEW OF SYSTEMS: Full 14 system review of systems performed and notable only for headache, numbness, dizziness, anxiety not enough sleep, decreased energy, racing thoughts, insomnia, achy muscles,  ALLERGIES: No Known Allergies  HOME MEDICATIONS: Current Outpatient Prescriptions  Medication Sig Dispense Refill  . acetaminophen (TYLENOL) 500 MG tablet Take 500 mg by mouth every 6 (six) hours as needed (for  pain.).    Marland Kitchen. clonazePAM (KLONOPIN) 0.5 MG tablet Take 1-2 at bedtime, as needed. #30 for 30 days. 30 tablet 1  . clotrimazole-betamethasone (LOTRISONE) cream Apply 1 application topically 2 (two) times daily. 30 g 0  . fentaNYL (DURAGESIC - DOSED MCG/HR) 50 MCG/HR Place 1 patch (50 mcg total) onto the skin every 3 (three) days. 10 patch 0  . fluconazole (DIFLUCAN) 150 MG tablet Take 1 tablet (150 mg total) by mouth daily.  7 tablet 0  . gabapentin (NEURONTIN) 300 MG capsule Take 3 capsules (900 mg total) by mouth 4 (four) times daily. 360 capsule 11  . metFORMIN (GLUCOPHAGE) 1000 MG tablet Take 1 tablet (1,000 mg total) by mouth 2 (two) times daily with a meal. 180 tablet 3  . nortriptyline (PAMELOR) 50 MG capsule Take 2 capsules (100 mg total) by mouth at bedtime. 60 capsule 11  . OXcarbazepine (TRILEPTAL) 150 MG tablet Take 2 tablets (300 mg total) by mouth 2 (two) times daily. 120 tablet 11   No current facility-administered medications for this visit.     PAST MEDICAL HISTORY: Past Medical History:  Diagnosis Date  . Abscess   . Chicken pox   . 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 Facial pain   . Hyperlipemia   . Increased frequency of headaches   . Migraine     PAST SURGICAL HISTORY: Past Surgical History:  Procedure Laterality Date  . I&D EXTREMITY Right 05/08/2015   Procedure: IRRIGATION AND DEBRIDEMENT THUMB;  Surgeon: Bradly BienenstockFred Ortmann, MD;  Location: [facility] OR;  Service: Orthopedics;  Laterality: Right;  . INCISE AND DRAIN ABCESS     left hand and jaw  . IRRIGATION AND DEBRIDEMENT ABSCESS N/A 10/10/2015   Procedure: IRRIGATION AND DEBRIDEMENT SCROTAL ABSCESS;  Surgeon: [name], MD;  Location: [facility] ORS;  Service: Urology;  Laterality: N/A;    FAMILY HISTORY: Family History  Problem Relation Age of Onset  . Heart disease Mother   . Diabetes Mother   . Multiple sclerosis Mother   . Vascular Disease Mother   . Hyperlipidemia Father   . Stroke Father   . Hypertension Father   . Lung cancer Father   . Diabetes Father   . Cancer Paternal Grandfather     prostate  . Heart disease Paternal Uncle     2 paternal uncles have stents    SOCIAL HISTORY:  Social History   Social History  . Marital status: Married    Spouse name: [name]  . Number of children: 0  . 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 Smoking status: Current Every Day Smoker    Packs/day: 1.00    Years: 31.00    Types: Cigarettes  . Smokeless tobacco: Never Used  . Alcohol use 0.0 oz/week     Comment: Social  . Drug use: No  . Sexual activity: Yes   Other Topics Concern  . Not on file   Social History Narrative   Lives at home with wife and sister.   Right-handed.   2 cups caffeine daily.     PHYSICAL EXAM   Vitals:   03/11/16 1024  BP: 130/85  Pulse: (!) 106  Weight: 176 lb (79.8 kg)  Height: 5' 8.5" (1.74 m)    Not recorded      Body mass index is 26.37 kg/m.  PHYSICAL EXAMNIATION:  Gen:  NAD, conversant, well nourised, obese, well groomed                     Cardiovascular: Regular rate rhythm, no peripheral edema, warm, nontender. Eyes: Conjunctivae clear without exudates or hemorrhage Neck: Supple, no carotid bruise. Pulmonary: Clear to auscultation bilaterally   NEUROLOGICAL EXAM:  MENTAL STATUS: Speech:    Speech is normal; fluent and spontaneous with normal comprehension.  Cognition:     Orientation to time, place and person     Normal recent and remote memory     Normal Attention span and concentration     Normal Language, naming, repeating,spontaneous speech     Fund of knowledge   CRANIAL NERVES: CN II: Visual fields are full to confrontation. Fundoscopic exam is normal with sharp discs and no vascular changes. Pupils are round equal and briskly reactive to light. CN III, IV, VI: extraocular movement are normal. No ptosis. CN V: Facial sensation is intact to pinprick in all 3 divisions bilaterally. Corneal responses are intact.  CN VII: Face is symmetric with normal eye closure and smile. CN VIII: Hearing is normal to rubbing fingers CN IX, X: Palate elevates symmetrically. Phonation is normal. CN XI: Head turning and shoulder shrug are intact CN XII: Tongue is midline with normal movements and no atrophy.  MOTOR: There is no pronator drift of  out-stretched arms. Muscle bulk and tone are normal. Muscle strength is normal.  REFLEXES: Reflexes are 2+ and symmetric at the biceps, triceps, knees, and ankles. Plantar responses are flexor.  SENSORY: Sensitivity involving left the skull, left cheek, left chin, mild skin erythematous, swelling of left upper eyelid, left cheek area  COORDINATION: Rapid alternating movements and fine finger movements are intact. There is no dysmetria on finger-to-nose and heel-knee-shin.    GAIT/STANCE: Posture is normal. Gait is steady with normal steps, base, arm swing, and turning. Heel and toe walking are normal. Tandem gait is normal.  Romberg is absent.   DIAGNOSTIC DATA (LABS, IMAGING, TESTING) - I reviewed patient records, labs, notes, testing and imaging myself where available.   ASSESSMENT AND PLAN  Edward [name] is a 41 y.o. 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  Polypharmacy treatment, Depression anxiety chronic insomnia  Continue current fentanyl patch 50 g every 3 days,  Gabapentin 300 mg 3 tablets 3 times a day  Nortriptyline 50 mg 2 tablets every night  Trileptal 150 mg 2 tablets twice a day  Klonopin 0.5 milligram as needed  If his symptoms has much improved, will try to taper off gabapentin, and fentanyl patch Chronic migraine headaches  Maxalt as needed  Levert Feinstein, M.D. Ph.D.  [facility] [address], [address] [place], [place] [address] Ph: [phone] Fax: [phone]  CC: ENT Dr. [name], primary care physician [name], MD

## 2016-03-12 ENCOUNTER — Telehealth: Payer: Self-pay

## 2016-03-12 NOTE — Telephone Encounter (Signed)
PA Ref # [id number] request for Fentanyl 50 mcg/hr has been approved from 03/11/16 through 03/10/17 (10 patches for 30 days) per fax from [employer] T # [phone].

## 2016-03-23 MED FILL — GABAPENTIN 300 MG CAPSULE: 300 | 30 days supply | Qty: 360 | Fill #2

## 2016-03-25 MED FILL — OXcarbazepine 150 MG TABS: 150 | 30 days supply | Qty: 120 | Fill #3

## 2016-03-30 MED FILL — BACLOFEN 10 MG TABLET: 10 | 30 days supply | Qty: 90 | Fill #1

## 2016-04-07 ENCOUNTER — Other Ambulatory Visit: Payer: Self-pay | Admitting: *Deleted

## 2016-04-07 ENCOUNTER — Encounter: Payer: Self-pay | Admitting: Neurology

## 2016-04-07 MED ORDER — FENTANYL 50 MCG/HR TD PT72: 50.0000 ug | MEDICATED_PATCH | TRANSDERMAL | 0 refills | Status: DC

## 2016-04-08 MED FILL — fentaNYL 50 MCG/HR PT72: 50 | 30 days supply | Qty: 10 | Fill #0

## 2016-04-12 MED FILL — NORTRIPTYLINE HCL 50 MG CAP: 50 | 30 days supply | Qty: 60 | Fill #3

## 2016-04-12 MED FILL — clonazePAM 0.5 MG TABS: 0.5 | 30 days supply | Qty: 30 | Fill #1

## 2016-05-05 ENCOUNTER — Other Ambulatory Visit: Payer: Self-pay | Admitting: *Deleted

## 2016-05-05 ENCOUNTER — Telehealth: Payer: Self-pay | Admitting: Neurology

## 2016-05-05 MED ORDER — FENTANYL 25 MCG/HR TD PT72: 25.0000 ug | MEDICATED_PATCH | TRANSDERMAL | 0 refills | Status: DC

## 2016-05-05 NOTE — Telephone Encounter (Signed)
Per Dr. Terrace ArabiaYan, if the patient's pain has improved she would like to wean him off the medication.  Spoke to patient - reports pain to be better.  Dr. Terrace ArabiaYan has provided him a prescription for Fentanyl 15mcg patches, #5.  Once he is done with this prescription, he will stop the medication.  He will call back with any concerns.  Also, he will check the price of the prescription with his insurance.  If it is too expensive, I have attached a goodrx coupon for [facility].  He can get the #5 patches of the 25mcg for $26.01.  Signed prescription placed up front for pick up.

## 2016-05-05 NOTE — Telephone Encounter (Signed)
Pt called said his insurance has changed, fentaNYL (DURAGESIC - DOSED MCG/HR) 50 MCG/HR will cost $90/mth. Is there something else that would less expensive. He has [employer] insurance now effective 03/01/16. He has to meet $3000 ded before RX's will be cost less. Please call

## 2016-05-12 MED FILL — fentaNYL 25 MCG/HR PT72: 25 | 15 days supply | Qty: 5 | Fill #0

## 2016-05-17 MED FILL — clonazePAM 0.5 MG TABS: 0.5 | 30 days supply | Qty: 30 | Fill #2

## 2016-05-27 ENCOUNTER — Ambulatory Visit: Payer: 59 | Admitting: Neurology

## 2016-05-28 ENCOUNTER — Encounter: Payer: Self-pay | Admitting: Neurology

## 2016-05-31 MED FILL — OXcarbazepine 150 MG TABS: 150 | 30 days supply | Qty: 120 | Fill #4

## 2016-05-31 MED FILL — NORTRIPTYLINE HCL 50 MG CAP: 50 | 30 days supply | Qty: 60 | Fill #4

## 2016-06-18 MED FILL — clonazePAM 0.5 MG TABS: 0.5 | 30 days supply | Qty: 30 | Fill #3

## 2016-06-18 MED FILL — GABAPENTIN 300 MG CAPSULE: 300 | 30 days supply | Qty: 360 | Fill #3

## 2016-06-22 MED FILL — FREESTYLE LITE METER: 1 days supply | Qty: 1 | Fill #0

## 2016-06-22 MED FILL — FREESTYLE LITE TEST STRIP: 90 days supply | Qty: 100 | Fill #1

## 2016-06-22 MED FILL — FREESTYLE LANCETS: 90 days supply | Qty: 100 | Fill #0

## 2016-06-24 MED FILL — NORTRIPTYLINE HCL 50 MG CAP: 50 | 30 days supply | Qty: 60 | Fill #5

## 2016-08-11 ENCOUNTER — Encounter: Payer: Self-pay | Admitting: Physician Assistant

## 2016-08-11 ENCOUNTER — Ambulatory Visit (INDEPENDENT_AMBULATORY_CARE_PROVIDER_SITE_OTHER): Payer: 59

## 2016-08-11 ENCOUNTER — Ambulatory Visit (INDEPENDENT_AMBULATORY_CARE_PROVIDER_SITE_OTHER): Payer: 59 | Admitting: Physician Assistant

## 2016-08-11 VITALS — BP 116/72 | HR 82 | Temp 98.7°F | Resp 16 | Ht 68.5 in | Wt 179.8 lb

## 2016-08-11 DIAGNOSIS — M545 Low back pain, unspecified: Secondary | ICD-10-CM

## 2016-08-11 DIAGNOSIS — M62830 Muscle spasm of back: Secondary | ICD-10-CM

## 2016-08-11 DIAGNOSIS — B379 Candidiasis, unspecified: Secondary | ICD-10-CM | POA: Diagnosis not present

## 2016-08-11 MED ORDER — CYCLOBENZAPRINE HCL 10 MG PO TABS: 10.0000 mg | ORAL_TABLET | Freq: Three times a day (TID) | ORAL | 0 refills | Status: DC | PRN

## 2016-08-11 MED ORDER — MELOXICAM 15 MG PO TABS: 15.0000 mg | ORAL_TABLET | Freq: Every day | ORAL | 1 refills | Status: DC

## 2016-08-11 MED ORDER — FLUCONAZOLE 150 MG PO TABS: 150.0000 mg | ORAL_TABLET | Freq: Every day | ORAL | 0 refills | Status: DC

## 2016-08-11 MED FILL — FLUCONAZOLE 150 MG TABLET: 150 | 7 days supply | Qty: 7 | Fill #0

## 2016-08-11 MED FILL — CYCLOBENZAPRINE 10 MG TABLE: 10 | 10 days supply | Qty: 30 | Fill #0

## 2016-08-11 MED FILL — MELOXICAM 15 MG TABLET: 15 | 30 days supply | Qty: 30 | Fill #0

## 2016-08-11 NOTE — Patient Instructions (Addendum)
Stay well hydrated - drink 2-3 liters of water daily.  Massages will help.  Apply heat 2-3 times a day for 20-30 minutes a session.  See stretches below. Patient is to perform exercises below at 5 sets with 10 repetitions. Stretches are to be performed for 5 sets, 10 seconds each. Recommended perform this rehab twice daily within pain tolerance for 2 weeks. Come back if you are not better in 2-3 weeks.  Thank you for coming in today. I hope you feel we met your needs.  Feel free to call [facility] if you have any questions or further requests.  Please consider signing up for MyChart if you do not already have it, as this is a great way to communicate with me.  Best,  Whitney McVey, PA-C   Low Back Strain Rehab Ask your health care provider which exercises are safe for you. Do exercises exactly as told by your health care provider and adjust them as directed. It is normal to feel mild stretching, pulling, tightness, or discomfort as you do these exercises, but you should stop right away if you feel sudden pain or your pain gets worse. Do not begin these exercises until told by your health care provider. Stretching and range of motion exercises These exercises warm up your muscles and joints and improve the movement and flexibility of your back. These exercises also help to relieve pain, numbness, and tingling. Exercise A: Single knee to chest  1. Lie on your back on a firm surface with both legs straight. 2. Bend one of your knees. Use your hands to move your knee up toward your chest until you feel a gentle stretch in your lower back and buttock. ? Hold your leg in this position by holding onto the front of your knee. ? Keep your other leg as straight as possible. 3. Hold for __________ seconds. 4. Slowly return to the starting position. 5. Repeat with your other leg. Repeat __________ times. Complete this exercise __________ times a day. Exercise B: Prone extension on elbows  1. Lie on your  abdomen on a firm surface. 2. Prop yourself up on your elbows. 3. Use your arms to help lift your chest up until you feel a gentle stretch in your abdomen and your lower back. ? This will place some of your body weight on your elbows. If this is uncomfortable, try stacking pillows under your chest. ? Your hips should stay down, against the surface that you are lying on. Keep your hip and back muscles relaxed. 4. Hold for __________ seconds. 5. Slowly relax your upper body and return to the starting position. Repeat __________ times. Complete this exercise __________ times a day. Strengthening exercises These exercises build strength and endurance in your back. Endurance is the ability to use your muscles for a long time, even after they get tired. Exercise C: Pelvic tilt 1. Lie on your back on a firm surface. Bend your knees and keep your feet flat. 2. Tense your abdominal muscles. Tip your pelvis up toward the ceiling and flatten your lower back into the floor. ? To help with this exercise, you may place a small towel under your lower back and try to push your back into the towel. 3. Hold for __________ seconds. 4. Let your muscles relax completely before you repeat this exercise. Repeat __________ times. Complete this exercise __________ times a day. Exercise D: Alternating arm and leg raises  1. Get on your hands and knees on a firm surface.  If you are on a hard floor, you may want to use padding to cushion your knees, such as an exercise mat. 2. Line up your arms and legs. Your hands should be below your shoulders, and your knees should be below your hips. 3. Lift your left leg behind you. At the same time, raise your right arm and straighten it in front of you. ? Do not lift your leg higher than your hip. ? Do not lift your arm higher than your shoulder. ? Keep your abdominal and back muscles tight. ? Keep your hips facing the ground. ? Do not arch your back. ? Keep your balance  carefully, and do not hold your breath. 4. Hold for __________ seconds. 5. Slowly return to the starting position and repeat with your right leg and your left arm. Repeat __________ times. Complete this exercise __________times a day. Exercise J: Single leg lower with bent knees 1. Lie on your back on a firm surface. 2. Tense your abdominal muscles and lift your feet off the floor, one foot at a time, so your knees and hips are bent in an "L" shape (at about 90 degrees). ? Your knees should be over your hips and your lower legs should be parallel to the floor. 3. Keeping your abdominal muscles tense and your knee bent, slowly lower one of your legs so your toe touches the ground. 4. Lift your leg back up to return to the starting position. ? Do not hold your breath. ? Do not let your back arch. Keep your back flat against the ground. 5. Repeat with your other leg. Repeat __________ times. Complete this exercise __________ times a day. Posture and body mechanics  Body mechanics refers to the movements and positions of your body while you do your daily activities. Posture is part of body mechanics. Good posture and healthy body mechanics can help to relieve stress in your body's tissues and joints. Good posture means that your spine is in its natural S-curve position (your spine is neutral), your shoulders are pulled back slightly, and your head is not tipped forward. The following are general guidelines for applying improved posture and body mechanics to your everyday activities. Standing   When standing, keep your spine neutral and your feet about hip-width apart. Keep a slight bend in your knees. Your ears, shoulders, and hips should line up.  When you do a task in which you stand in one place for a long time, place one foot up on a stable object that is 2-4 inches (5-10 cm) high, such as a footstool. This helps keep your spine neutral. Sitting   When sitting, keep your spine neutral and  keep your feet flat on the floor. Use a footrest, if necessary, and keep your thighs parallel to the floor. Avoid rounding your shoulders, and avoid tilting your head forward.  When working at a desk or a computer, keep your desk at a height where your hands are slightly lower than your elbows. Slide your chair under your desk so you are close enough to maintain good posture.  When working at a computer, place your monitor at a height where you are looking straight ahead and you do not have to tilt your head forward or downward to look at the screen. Resting   When lying down and resting, avoid positions that are most painful for you.  If you have pain with activities such as sitting, bending, stooping, or squatting (flexion-based activities), lie in a position  in which your body does not bend very much. For example, avoid curling up on your side with your arms and knees near your chest (fetal position).  If you have pain with activities such as standing for a long time or reaching with your arms (extension-based activities), lie with your spine in a neutral position and bend your knees slightly. Try the following positions: ? Lying on your side with a pillow between your knees. ? Lying on your back with a pillow under your knees. Lifting   When lifting objects, keep your feet at least shoulder-width apart and tighten your abdominal muscles.  Bend your knees and hips and keep your spine neutral. It is important to lift using the strength of your legs, not your back. Do not lock your knees straight out.  Always ask for help to lift heavy or awkward objects. This information is not intended to replace advice given to you by your health care provider. Make sure you discuss any questions you have with your health care provider. Document Released: 02/15/2005 Document Revised: 10/23/2015 Document Reviewed: 11/27/2014 Elsevier Interactive Patient Education  2018 [employer].   IF you received  an x-ray today, you will receive an invoice from [facility]. Please contact [facility] at [phone] with questions or concerns regarding your invoice.   IF you received labwork today, you will receive an invoice from [facility]. Please contact [facility] at [phone] with questions or concerns regarding your invoice.   Our billing staff will not be able to assist you with questions regarding bills from these companies.  You will be contacted with the lab results as soon as they are available. The fastest way to get your results is to activate your My Chart account. Instructions are located on the last page of this paperwork. If you have not heard from [place] regarding the results in 2 weeks, please contact this office.
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
## 2016-09-13 MED FILL — FREESTYLE LITE TEST STRIP: 90 days supply | Qty: 100 | Fill #0

## 2016-09-13 MED FILL — GABAPENTIN 300 MG CAPSULE: 300 | 30 days supply | Qty: 360 | Fill #4

## 2016-11-29 MED FILL — GABAPENTIN 300 MG CAPSULE: 300 | 30 days supply | Qty: 360 | Fill #5

## 2016-12-13 MED FILL — FREESTYLE LITE TEST STRIP: 90 days supply | Qty: 100 | Fill #1

## 2016-12-18 DIAGNOSIS — Z23 Encounter for immunization: Secondary | ICD-10-CM | POA: Diagnosis not present

## 2016-12-30 ENCOUNTER — Other Ambulatory Visit: Payer: Self-pay | Admitting: Neurology

## 2016-12-30 MED FILL — GABAPENTIN 300 MG CAPSULE: 300 | 30 days supply | Qty: 360 | Fill #0

## 2017-01-28 ENCOUNTER — Encounter (HOSPITAL_COMMUNITY): Payer: Self-pay

## 2017-01-28 ENCOUNTER — Other Ambulatory Visit: Payer: Self-pay

## 2017-01-28 ENCOUNTER — Emergency Department (HOSPITAL_COMMUNITY)
Admission: EM | Admit: 2017-01-28 | Discharge: 2017-01-28 | Disposition: A | Discharge status: Home / Self Care | Payer: 59 | Attending: Emergency Medicine | Admitting: Emergency Medicine

## 2017-01-28 DIAGNOSIS — S0501XA Injury of conjunctiva and corneal abrasion without foreign body, right eye, initial encounter: Secondary | ICD-10-CM | POA: Diagnosis not present

## 2017-01-28 DIAGNOSIS — E119 Type 2 diabetes mellitus without complications: Secondary | ICD-10-CM | POA: Insufficient documentation

## 2017-01-28 DIAGNOSIS — Y9389 Activity, other specified: Secondary | ICD-10-CM | POA: Diagnosis not present

## 2017-01-28 DIAGNOSIS — Z79899 Other long term (current) drug therapy: Secondary | ICD-10-CM | POA: Diagnosis not present

## 2017-01-28 DIAGNOSIS — W541XXA Struck by dog, initial encounter: Secondary | ICD-10-CM | POA: Insufficient documentation

## 2017-01-28 DIAGNOSIS — F17210 Nicotine dependence, cigarettes, uncomplicated: Secondary | ICD-10-CM | POA: Insufficient documentation

## 2017-01-28 DIAGNOSIS — Y999 Unspecified external cause status: Secondary | ICD-10-CM | POA: Insufficient documentation

## 2017-01-28 DIAGNOSIS — Y929 Unspecified place or not applicable: Secondary | ICD-10-CM | POA: Insufficient documentation

## 2017-01-28 DIAGNOSIS — S0993XA Unspecified injury of face, initial encounter: Secondary | ICD-10-CM | POA: Diagnosis present

## 2017-01-28 MED ORDER — FLUORESCEIN SODIUM 1 MG OP STRP
1.0000 | ORAL_STRIP | Freq: Once | OPHTHALMIC | Status: AC
  Administered 2017-01-28: 1 via OPHTHALMIC
  Filled 2017-01-28: qty 1

## 2017-01-28 MED ORDER — TETRACAINE HCL 0.5 % OP SOLN
1.0000 [drp] | Freq: Once | OPHTHALMIC | Status: AC
  Administered 2017-01-28: 1 [drp] via OPHTHALMIC
  Filled 2017-01-28: qty 4

## 2017-01-28 MED ORDER — ERYTHROMYCIN 5 MG/GM OP OINT: TOPICAL_OINTMENT | OPHTHALMIC | 0 refills | Status: DC

## 2017-01-28 MED FILL — ERYTHROMYCIN EYE OINTMENT: 5 | 7 days supply | Qty: 4 | Fill #0

## 2017-01-28 NOTE — ED Triage Notes (Signed)
Patient states he was scratched in the right eye by a dog last night. Today, the right eye is painful and red.

## 2017-01-28 NOTE — ED Notes (Addendum)
Rt Eye irrigated

## 2017-01-28 NOTE — ED Notes (Signed)
Bed: WTR8 Expected date:  Expected time:  Means of arrival:  Comments: 

## 2017-01-28 NOTE — Discharge Instructions (Signed)
Take antibiotics to completion as prescribed.  Follow-up with an ophthalmologist in the next 24-48 hours for reevaluation.  Return to the ED immediately if any concerning signs or symptoms develop such as fever, swelling of the eyelids, loss of vision, or abnormal drainage.

## 2017-01-28 NOTE — ED Provider Notes (Signed)
[facility]-EMERGENCY DEPT Provider Note   CSN: [id number] Arrival date & time: 01/28/17  1105     History   Chief Complaint Chief Complaint  Patient presents with  . eye injury    right    HPI Edward [name] is a 41 y.o. male with history of diabetes, HLD presents today with chief complaint acute onset, constant right eye pain which began yesterday evening.  Patient states he was playing with his puppy when her paw scratched his right eye.  He endorses development of conjunctival erythema and foreign body sensation.  He notes a "scratchy sharp pain "when he moves his eye around.  He also notes tearful drainage and blurry vision as a result in the right eye.  Endorses photophobia.  Denies fevers, headaches, chills.  He states he copiously irrigated the eye after the injury.  He does not wear contacts.  He is up-to-date on his tetanus.  His puppy is up-to-date on its immunizations.   The history is provided by the patient.    Past Medical History:  Diagnosis Date  . Abscess   . Chicken pox   . 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 Facial pain   . Hyperlipemia   . Increased frequency of headaches   . Migraine     Patient Active Problem List   Diagnosis Date Noted  . Polypharmacy 03/11/2016  . Trigeminal neuralgia pain 12/09/2015  . Scrotal abscess 10/10/2015  . Chronic pain of right lower extremity 11/16/2013  . DM (diabetes mellitus), type 2 (HCC) 04/12/2012  . Migraine headache 04/12/2012  . Soft tissue mass 04/12/2012  . Tobacco use disorder 04/12/2012    Past Surgical History:  Procedure Laterality Date  . I&D EXTREMITY Right 05/08/2015   Procedure: IRRIGATION AND DEBRIDEMENT THUMB;  Surgeon: [name], MD;  Location: [facility] OR;  Service: Orthopedics;  Laterality: Right;  . INCISE AND DRAIN ABCESS     left hand and jaw  . IRRIGATION AND DEBRIDEMENT ABSCESS N/A 10/10/2015   Procedure: IRRIGATION AND DEBRIDEMENT  SCROTAL ABSCESS;  Surgeon: Sebastian Ache, MD;  Location: [facility] ORS;  Service: Urology;  Laterality: N/A;       Home Medications    Prior to Admission medications   Medication Sig Start Date End Date Taking? Authorizing Provider  cyclobenzaprine (FLEXERIL) 10 MG tablet Take 1 tablet (10 mg total) by mouth 3 (three) times daily as needed for muscle spasms. 08/11/16   McVey, Madelaine Bhat, PA-C  erythromycin ophthalmic ointment Place a 1/2 inch ribbon of ointment into the lower eyelid 4 times daily for 7 days. 01/28/17   Iann Rodier A, PA-C  fluconazole (DIFLUCAN) 150 MG tablet Take 1 tablet (150 mg total) by mouth daily. 08/11/16   McVey, Madelaine Bhat, PA-C  gabapentin (NEURONTIN) 300 MG capsule TAKE 3 CAPSULES BY MOUTH 4 TIMES DAILY. 12/30/16   Levert Feinstein, MD  meloxicam (MOBIC) 15 MG tablet Take 1 tablet (15 mg total) by mouth daily. 08/11/16   McVey, Madelaine Bhat, PA-C  metFORMIN (GLUCOPHAGE) 1000 MG tablet Take 1 tablet (1,000 mg total) by mouth 2 (two) times daily with a meal. Patient not taking: Reported on 08/11/2016 02/25/14   Wallis Bamberg, PA-C    Family History Family History  Problem Relation Age of Onset  . Heart disease Mother   . Diabetes Mother   . Multiple sclerosis Mother   . Vascular Disease Mother   . Hyperlipidemia Father   .  Stroke Father   . Hypertension Father   . Lung cancer Father   . Diabetes Father   . Cancer Paternal Grandfather        prostate  . Heart disease Paternal Uncle        2 paternal uncles have stents    Social History Social History   Tobacco Use  . Smoking status: Current Every Day Smoker    Packs/day: 1.00    Years: 31.00    Pack years: 31.00    Types: Cigarettes  . Smokeless tobacco: Never Used  Substance Use Topics  . Alcohol use: Yes    Alcohol/week: 0.0 oz    Comment: Social  . Drug use: No     Allergies   Patient has no known allergies.   Review of Systems Review of Systems  Constitutional: Negative for  chills and fever.  Eyes: Positive for photophobia, pain, discharge, redness, itching and visual disturbance.  Neurological: Negative for syncope and headaches.     Physical Exam Updated Vital Signs BP (!) 130/96 (BP Location: Left Arm)   Pulse 80   Temp 98.9 F (37.2 C) (Oral)   Resp 18   Ht 5\' 8"  (1.727 m)   Wt 84.8 kg (187 lb)   SpO2 100%   BMI 28.43 kg/m   Physical Exam  Constitutional: He appears well-developed and well-nourished. No distress.  HENT:  Head: Normocephalic and atraumatic.  Right Ear: External ear normal.  Left Ear: External ear normal.  Eyes: Conjunctivae and EOM are normal. Pupils are equal, round, and reactive to light. Right eye exhibits no discharge. Left eye exhibits no discharge.  No tenderness to palpation of the periorbital region.  No erythema or swelling of the eyelids.  No foreign bodies noted.  Right eye with conjunctival injection.  No proptosis, chemosis, or consensual photophobia.  Fluorescein stain shows multiple small corneal abrasions and some uptake along the conjunctivae just outside the iris on the nasal side. Seidel sign absent.  No dendritic lesions, rust rings, or foreign bodies noted.    Visual Acuity  Right Eye Near: R Near: 20/25 Left Eye Near:  L Near: 20/25 Bilateral Near:  20/25   Neck: Normal range of motion. Neck supple. No JVD present. No tracheal deviation present.  Cardiovascular: Normal rate.  Pulmonary/Chest: Effort normal.  Abdominal: He exhibits no distension.  Musculoskeletal: He exhibits no edema.  Neurological: He is alert.  Fluent speech, no facial droop  Skin: Skin is warm and dry. No erythema.  Psychiatric: He has a normal mood and affect. His behavior is normal.  Nursing note and vitals reviewed.    ED Treatments / Results  Labs (all labs ordered are listed, but only abnormal results are displayed) Labs Reviewed - No data to display  EKG  EKG Interpretation None       Radiology No results  found.  Procedures Procedures (including critical care time)  Medications Ordered in ED Medications  fluorescein ophthalmic strip 1 strip (1 strip Right Eye Given 01/28/17 1234)  tetracaine (PONTOCAINE) 0.5 % ophthalmic solution 1 drop (1 drop Right Eye Given 01/28/17 1235)     Initial Impression / Assessment and Plan / ED Course  I have reviewed the triage vital signs and the nursing notes.  Pertinent labs & imaging results that were available during my care of the patient were reviewed by me and considered in my medical decision making (see chart for details).     Pt with corneal abrasion on PE.  Afebrile, vital signs are stable.  Tdap up to date. Eye irrigated w NS, no evidence of FB.  No change in vision, acuity equal bilaterally. Pt is not a contact lens wearer.  Exam non-concerning for orbital cellulitis, iritis, hyphema, or HSV. No evidence of globe rupture. Patient will be discharged home with erythromycin.   Patient understands to follow up with ophthalmology in 24-48 hours, & to return to ER if new symptoms develop including change in vision, purulent drainage, or entrapment. Pt verbalized understanding of and agreement with plan and is safe for discharge home at this time.  Final Clinical Impressions(s) / ED Diagnoses   Final diagnoses:  Abrasion of right cornea, initial encounter    ED Discharge Orders        Ordered    erythromycin ophthalmic ointment     01/28/17 1212       Jeanie SewerFawze, Crist Kruszka A, PA-C 01/28/17 1254    Nira Connardama, Pedro Eduardo, MD 01/29/17 [phone]

## 2017-01-29 ENCOUNTER — Other Ambulatory Visit: Payer: Self-pay

## 2017-01-29 DIAGNOSIS — R2242 Localized swelling, mass and lump, left lower limb: Secondary | ICD-10-CM | POA: Diagnosis present

## 2017-01-29 DIAGNOSIS — L02214 Cutaneous abscess of groin: Secondary | ICD-10-CM | POA: Diagnosis not present

## 2017-01-29 DIAGNOSIS — F17210 Nicotine dependence, cigarettes, uncomplicated: Secondary | ICD-10-CM | POA: Diagnosis not present

## 2017-01-29 DIAGNOSIS — L02416 Cutaneous abscess of left lower limb: Secondary | ICD-10-CM | POA: Diagnosis not present

## 2017-01-29 DIAGNOSIS — E119 Type 2 diabetes mellitus without complications: Secondary | ICD-10-CM | POA: Insufficient documentation

## 2017-01-29 DIAGNOSIS — Z79899 Other long term (current) drug therapy: Secondary | ICD-10-CM | POA: Diagnosis not present

## 2017-01-29 DIAGNOSIS — L03116 Cellulitis of left lower limb: Secondary | ICD-10-CM | POA: Insufficient documentation

## 2017-01-29 LAB — COMPREHENSIVE METABOLIC PANEL
ALBUMIN: 4.3 g/dL (ref 3.5–5.0)
ALT: 16 U/L — ABNORMAL LOW (ref 17–63)
ANION GAP: 9 (ref 5–15)
AST: 15 U/L (ref 15–41)
Alkaline Phosphatase: 76 U/L (ref 38–126)
BILIRUBIN TOTAL: 0.2 mg/dL — AB (ref 0.3–1.2)
BUN: 12 mg/dL (ref 6–20)
CHLORIDE: 99 mmol/L — AB (ref 101–111)
CO2: 26 mmol/L (ref 22–32)
Calcium: 8.9 mg/dL (ref 8.9–10.3)
Creatinine, Ser: 0.98 mg/dL (ref 0.61–1.24)
GFR calc Af Amer: >60 mL/min (ref >60)
GFR calc non Af Amer: >60 mL/min (ref >60)
GLUCOSE: 273 mg/dL — AB (ref 65–99)
POTASSIUM: 3.7 mmol/L (ref 3.5–5.1)
SODIUM: 134 mmol/L — AB (ref 135–145)
Total Protein: 7.2 g/dL (ref 6.5–8.1)

## 2017-01-29 LAB — CBC WITH DIFFERENTIAL/PLATELET
BASOS ABS: 0 10*3/uL (ref 0–0.1)
Basophils Relative: 0 %
EOS PCT: 1 %
Eosinophils Absolute: 0.1 10*3/uL (ref 0–0.7)
HEMATOCRIT: 45.3 % (ref 40.0–52.0)
Hemoglobin: 15.9 g/dL (ref 13.0–18.0)
LYMPHS ABS: 2.3 10*3/uL (ref 1.0–3.6)
LYMPHS PCT: 20 %
MCH: 32.5 pg (ref 26.0–34.0)
MCHC: 35.2 g/dL (ref 32.0–36.0)
MCV: 92.3 fL (ref 80.0–100.0)
MONO ABS: 0.8 10*3/uL (ref 0.2–1.0)
MONOS PCT: 7 %
NEUTROS ABS: 8.4 10*3/uL — AB (ref 1.4–6.5)
Neutrophils Relative %: 72 %
PLATELETS: 222 10*3/uL (ref 150–440)
RBC: 4.91 MIL/uL (ref 4.40–5.90)
RDW: 12.9 % (ref 11.5–14.5)
WBC: 11.8 10*3/uL — ABNORMAL HIGH (ref 3.8–10.6)

## 2017-01-29 NOTE — ED Notes (Signed)
Patient sitting on bench in no acute distress at this time. 

## 2017-01-29 NOTE — ED Triage Notes (Addendum)
Patient reports noted area to left groin 3 days ago that he put off to "chaffing"  Reports yesterday area painful and swollen, during the night it "popped" but remains painful.  Reports he is diabetic and has history of hospitalization for infections.

## 2017-01-30 ENCOUNTER — Emergency Department
Admission: EM | Admit: 2017-01-30 | Discharge: 2017-01-30 | Disposition: A | Discharge status: Home / Self Care | Payer: 59 | Attending: Emergency Medicine | Admitting: Emergency Medicine

## 2017-01-30 DIAGNOSIS — L02214 Cutaneous abscess of groin: Secondary | ICD-10-CM | POA: Diagnosis not present

## 2017-01-30 DIAGNOSIS — L03116 Cellulitis of left lower limb: Secondary | ICD-10-CM

## 2017-01-30 DIAGNOSIS — F17210 Nicotine dependence, cigarettes, uncomplicated: Secondary | ICD-10-CM | POA: Diagnosis not present

## 2017-01-30 DIAGNOSIS — E119 Type 2 diabetes mellitus without complications: Secondary | ICD-10-CM | POA: Diagnosis not present

## 2017-01-30 DIAGNOSIS — Z79899 Other long term (current) drug therapy: Secondary | ICD-10-CM | POA: Diagnosis not present

## 2017-01-30 DIAGNOSIS — L0291 Cutaneous abscess, unspecified: Secondary | ICD-10-CM

## 2017-01-30 MED ORDER — CLINDAMYCIN PHOSPHATE 600 MG/50ML IV SOLN
600.0000 mg | Freq: Once | INTRAVENOUS | Status: AC
  Administered 2017-01-30: 600 mg via INTRAVENOUS
  Filled 2017-01-30: qty 50

## 2017-01-30 MED ORDER — SODIUM CHLORIDE 0.9 % IV BOLUS (SEPSIS)
1000.0000 mL | Freq: Once | INTRAVENOUS | Status: AC
  Administered 2017-01-30: 1000 mL via INTRAVENOUS

## 2017-01-30 MED ORDER — MORPHINE SULFATE (PF) 4 MG/ML IV SOLN
4.0000 mg | Freq: Once | INTRAVENOUS | Status: AC
  Administered 2017-01-30: 4 mg via INTRAVENOUS
  Filled 2017-01-30: qty 1

## 2017-01-30 MED ORDER — IBUPROFEN 800 MG PO TABS: 800.0000 mg | ORAL_TABLET | Freq: Three times a day (TID) | ORAL | 0 refills | Status: DC | PRN

## 2017-01-30 MED ORDER — CLINDAMYCIN HCL 300 MG PO CAPS: 300.0000 mg | ORAL_CAPSULE | Freq: Three times a day (TID) | ORAL | 0 refills | Status: DC

## 2017-01-30 MED ORDER — OXYCODONE-ACETAMINOPHEN 5-325 MG PO TABS: 1.0000 | ORAL_TABLET | ORAL | 0 refills | Status: DC | PRN

## 2017-01-30 NOTE — ED Notes (Signed)
Pt ambulatory to POV with girlfriend. He is able to ambulate with slight limp, denies need of wheel chair. VSS, NAD, Skin PWD, respirations non-labored and equal. No questions or concerns voiced during discharge instructions.

## 2017-01-30 NOTE — Discharge Instructions (Signed)
1.  Take antibiotic as prescribed (clindamycin 300 mg 3 times daily x 10 days). 2.  You may take pain medicines as needed (Motrin/Percocet #15). 3.  Apply warm compress or soak in a warm tub several times daily to promote drainage. 4.  Return to the ER for worsening symptoms, persistent vomiting, fever or other concerns.

## 2017-01-30 NOTE — ED Notes (Signed)
Pt has large red area on his left groin that is red and swollen in the middle is a white circular area that has a some purulent drainage from it.

## 2017-01-30 NOTE — ED Provider Notes (Signed)
[facility] Emergency Department Provider Note   ____________________________________________   First MD Initiated Contact with Patient 01/30/17 0142     (approximate)  I have reviewed the triage vital signs and the nursing notes.   HISTORY  Chief Complaint Abscess    HPI Edward [name] is a 41 y.o. male who presents to the ED from home with a chief complaint of left groin pain and swelling.  Patient is a diabetic, noncompliant with medications, who reports pain and swelling to his left groin x 3 days.  Reports the area "popped" yesterday.  Denies associated fever, chills, chest pain, shortness of breath, abdominal pain, nausea or vomiting.  Denies recent travel or trauma.   Past Medical History:  Diagnosis Date  . Abscess   . Chicken pox   . 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 Facial pain   . Hyperlipemia   . Increased frequency of headaches   . Migraine     Patient Active Problem List   Diagnosis Date Noted  . Polypharmacy 03/11/2016  . Trigeminal neuralgia pain 12/09/2015  . Scrotal abscess 10/10/2015  . Chronic pain of right lower extremity 11/16/2013  . DM (diabetes mellitus), type 2 (HCC) 04/12/2012  . Migraine headache 04/12/2012  . Soft tissue mass 04/12/2012  . Tobacco use disorder 04/12/2012    Past Surgical History:  Procedure Laterality Date  . I&D EXTREMITY Right 05/08/2015   Procedure: IRRIGATION AND DEBRIDEMENT THUMB;  Surgeon: Bradly BienenstockFred Ortmann, MD;  Location: [facility] OR;  Service: Orthopedics;  Laterality: Right;  . INCISE AND DRAIN ABCESS     left hand and jaw  . IRRIGATION AND DEBRIDEMENT ABSCESS N/A 10/10/2015   Procedure: IRRIGATION AND DEBRIDEMENT SCROTAL ABSCESS;  Surgeon: [name], MD;  Location: [facility] ORS;  Service: Urology;  Laterality: N/A;    Prior to Admission medications   Medication Sig Start Date End Date Taking? Authorizing Provider  clindamycin (CLEOCIN) 300 MG capsule Take 1  capsule (300 mg total) by mouth 3 (three) times daily. 01/30/17   Irean HongSung, Baylen Dea J, MD  cyclobenzaprine (FLEXERIL) 10 MG tablet Take 1 tablet (10 mg total) by mouth 3 (three) times daily as needed for muscle spasms. 08/11/16   McVey, Madelaine BhatElizabeth Whitney, PA-C  erythromycin ophthalmic ointment Place a 1/2 inch ribbon of ointment into the lower eyelid 4 times daily for 7 days. 01/28/17   Fawze, Mina A, PA-C  fluconazole (DIFLUCAN) 150 MG tablet Take 1 tablet (150 mg total) by mouth daily. 08/11/16   McVey, Madelaine BhatElizabeth Whitney, PA-C  gabapentin (NEURONTIN) 300 MG capsule TAKE 3 CAPSULES BY MOUTH 4 TIMES DAILY. 12/30/16   Levert FeinsteinYan, Yijun, MD  ibuprofen (ADVIL,MOTRIN) 800 MG tablet Take 1 tablet (800 mg total) by mouth every 8 (eight) hours as needed for moderate pain. 01/30/17   Irean HongSung, Bernadene Garside J, MD  meloxicam (MOBIC) 15 MG tablet Take 1 tablet (15 mg total) by mouth daily. 08/11/16   McVey, Madelaine BhatElizabeth Whitney, PA-C  metFORMIN (GLUCOPHAGE) 1000 MG tablet Take 1 tablet (1,000 mg total) by mouth 2 (two) times daily with a meal. Patient not taking: Reported on 08/11/2016 02/25/14   Wallis BambergMani, Mario, PA-C  oxyCODONE-acetaminophen (ROXICET) 5-325 MG tablet Take 1 tablet by mouth every 4 (four) hours as needed for severe pain. 01/30/17   Irean HongSung, Ajai Harville J, MD    Allergies Patient has no known allergies.  Family History  Problem Relation Age of Onset  . Heart disease Mother   .  Diabetes Mother   . Multiple sclerosis Mother   . Vascular Disease Mother   . Hyperlipidemia Father   . Stroke Father   . Hypertension Father   . Lung cancer Father   . Diabetes Father   . Cancer Paternal Grandfather        prostate  . Heart disease Paternal Uncle        2 paternal uncles have stents    Social History Social History   Tobacco Use  . Smoking status: Current Every Day Smoker    Packs/day: 1.00    Years: 31.00    Pack years: 31.00    Types: Cigarettes  . Smokeless tobacco: Never Used  Substance Use Topics  . Alcohol use: Yes     Alcohol/week: 0.0 oz    Comment: Social  . Drug use: No    Review of Systems  Constitutional: No fever/chills. Eyes: No visual changes. ENT: No sore throat. Cardiovascular: Denies chest pain. Respiratory: Denies shortness of breath. Gastrointestinal: No abdominal pain.  No nausea, no vomiting.  No diarrhea.  No constipation. Genitourinary: Negative for dysuria. Musculoskeletal: Negative for back pain. Skin: Positive for abscess to left groin.  Negative for rash. Neurological: Negative for headaches, focal weakness or numbness.   ____________________________________________   PHYSICAL EXAM:  VITAL SIGNS: ED Triage Vitals  Enc Vitals Group     BP 01/29/17 2027 (!) 134/100     Pulse Rate 01/29/17 2027 (!) 102     Resp 01/29/17 2027 20     Temp 01/29/17 2027 98.8 F (37.1 C)     Temp Source 01/29/17 2027 Oral     SpO2 --      Weight 01/29/17 2029 176 lb (79.8 kg)     Height --      Head Circumference --      Peak Flow --      Pain Score 01/29/17 2027 7     Pain Loc --      Pain Edu? --      Excl. in GC? --     Constitutional: Alert and oriented. Well appearing and in no acute distress. Eyes: Conjunctivae are normal. PERRL. EOMI. Head: Atraumatic. Nose: No congestion/rhinnorhea. Mouth/Throat: Mucous membranes are moist.  Oropharynx non-erythematous. Neck: No stridor.   Cardiovascular: Normal rate, regular rhythm. Grossly normal heart sounds.  Good peripheral circulation. Respiratory: Normal respiratory effort.  No retractions. Lungs CTAB. Gastrointestinal: Soft and nontender. No distention. No abdominal bruits. No CVA tenderness. Genitourinary: Circumcised male, no erythema to testicles, especially left testicle.  Cellulitis does not extend to perineum. Musculoskeletal: Left inguinal area with small draining abscess and surrounding area of erythema and warmth consistent with cellulitis.  2+ femoral and distal pulses. Neurologic:  Normal speech and language. No gross  focal neurologic deficits are appreciated. No gait instability. Skin:  Skin is warm, dry and intact. No rash noted. Psychiatric: Mood and affect are normal. Speech and behavior are normal.  ____________________________________________   LABS (all labs ordered are listed, but only abnormal results are displayed)  Labs Reviewed  COMPREHENSIVE METABOLIC PANEL - Abnormal; Notable for the following components:      Result Value   Sodium 134 (*)    Chloride 99 (*)    Glucose, Bld 273 (*)    ALT 16 (*)    Total Bilirubin 0.2 (*)    All other components within normal limits  CBC WITH DIFFERENTIAL/PLATELET - Abnormal; Notable for the following components:   WBC 11.8 (*)  Neutro Abs 8.4 (*)    All other components within normal limits  AEROBIC/ANAEROBIC CULTURE (SURGICAL/DEEP WOUND)  CULTURE, BLOOD (ROUTINE X 2)  CULTURE, BLOOD (ROUTINE X 2)   ____________________________________________  EKG  None ____________________________________________  RADIOLOGY  No results found.  ____________________________________________   PROCEDURES  Procedure(s) performed: None  Procedures  Critical Care performed: No  ____________________________________________   INITIAL IMPRESSION / ASSESSMENT AND PLAN / ED COURSE  As part of my medical decision making, I reviewed the following data within the electronic MEDICAL RECORD NUMBER History obtained from family, Labs reviewed and Notes from prior ED visits.   41 year old male who is medically noncompliant with diabetes medications who presents with draining abscess with cellulitis to his left groin.  He is afebrile with normal WBC; clinically there is no evidence of Fournier's gangrene on examination.  Will obtain blood cultures, administer IV clindamycin, analgesics and reassess.  Clinical Course as of Jan 31 404  Wynelle Link Jan 30, 2017  0404 IV antibiotic completed.  Patient is pain-free currently.  Will discharge home with clindamycin,  analgesia.  Area of cellulitis marked with sharpie.  Patient will follow-up closely with his PCP early next week.  Strict return precautions given.  Patient and spouse verbalize understanding and agree with care.  [JS]    Clinical Course User Index [JS] Irean Hong, MD     ____________________________________________   FINAL CLINICAL IMPRESSION(S) / ED DIAGNOSES  Final diagnoses:  Cellulitis of left lower extremity  Abscess     ED Discharge Orders        Ordered    clindamycin (CLEOCIN) 300 MG capsule  3 times daily     01/30/17 0224    oxyCODONE-acetaminophen (ROXICET) 5-325 MG tablet  Every 4 hours PRN     01/30/17 0224    ibuprofen (ADVIL,MOTRIN) 800 MG tablet  Every 8 hours PRN     01/30/17 0224       Note:  This document was prepared using Dragon voice recognition software and may include unintentional dictation errors.    Irean Hong, MD 01/30/17 [phone]

## 2017-02-04 LAB — CULTURE, BLOOD (ROUTINE X 2)
CULTURE: NO GROWTH
Culture: NO GROWTH
SPECIAL REQUESTS: ADEQUATE
Special Requests: ADEQUATE

## 2017-02-04 LAB — AEROBIC/ANAEROBIC CULTURE W GRAM STAIN (SURGICAL/DEEP WOUND)

## 2017-02-04 LAB — AEROBIC/ANAEROBIC CULTURE (SURGICAL/DEEP WOUND)

## 2017-02-09 IMAGING — MR MR CERVICAL SPINE W/O CM
4 of 5 series · 19 of 48 positions shown · non-contrast
Comparison: None.

CLINICAL DATA: Neck pain radiating to the right shoulder and arm
for 2-3 months with weakness.

EXAM:
MRI CERVICAL SPINE WITHOUT CONTRAST
TECHNIQUE: Multiplanar, multisequence MR imaging of the cervical spine was
performed. No intravenous contrast was administered.

[Series 2: T1 · sagittal · 3.0mm · 0.41mm/px · 3 of 14 slices shown]
[im 3/14]
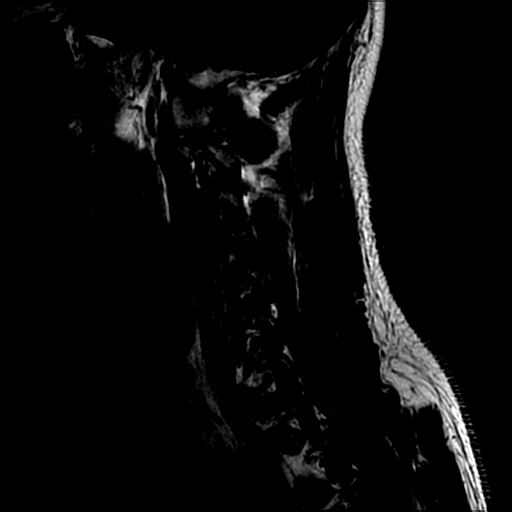
[im 8/14]
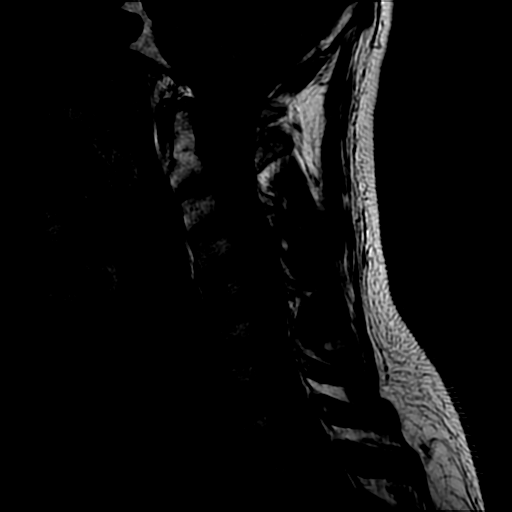
[im 14/14]
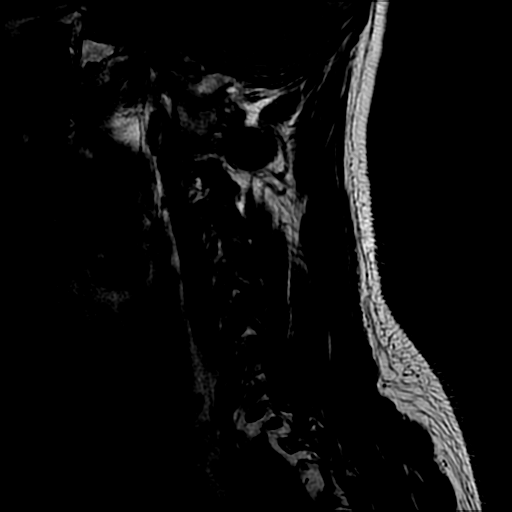

[Series 4: T2 post-contrast · sagittal · 3.0mm · 0.41mm/px · 6 of 15 slices shown]
[im 1/15]
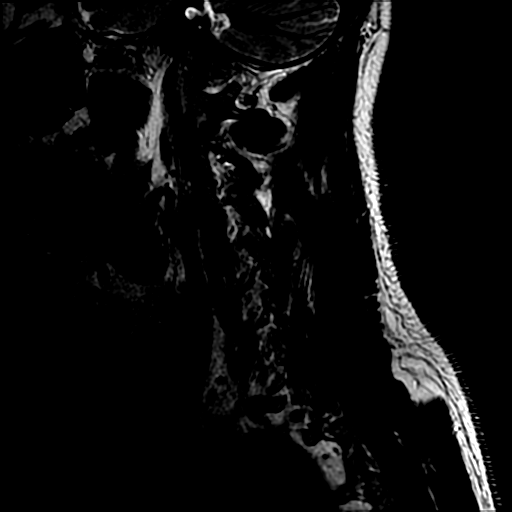
[im 3/15]
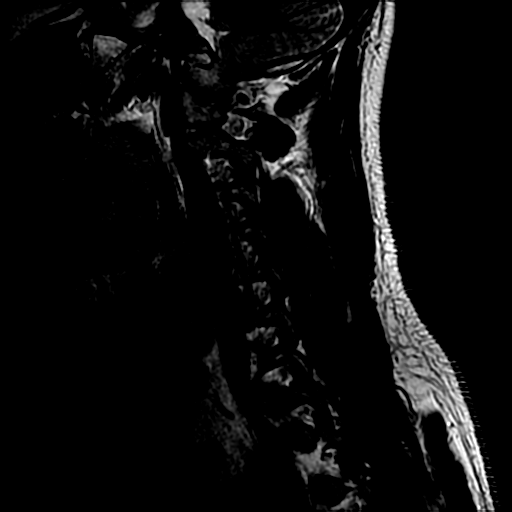
[im 6/15]
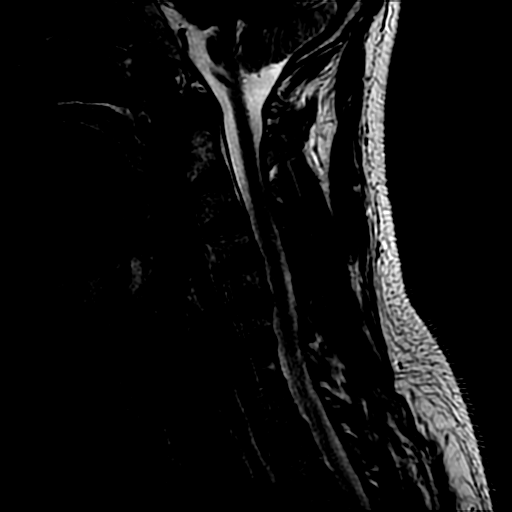
[im 9/15]
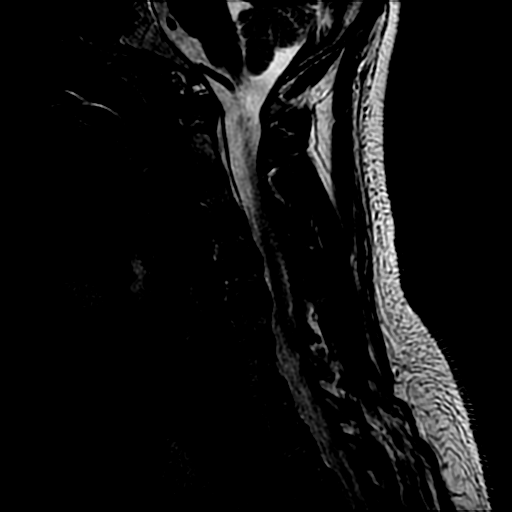
[im 12/15]
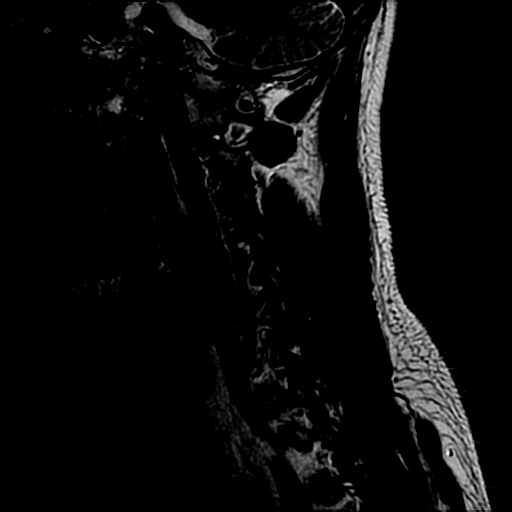
[im 15/15]
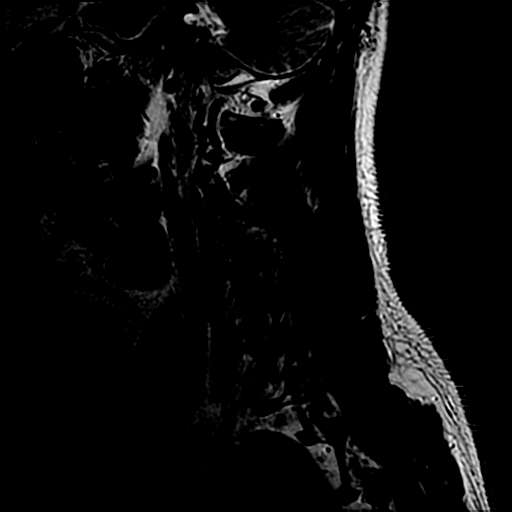

[Series 5: fast sag ir · sagittal · 3.0mm · 0.41mm/px · 3 of 15 slices shown]
[im 3/15]
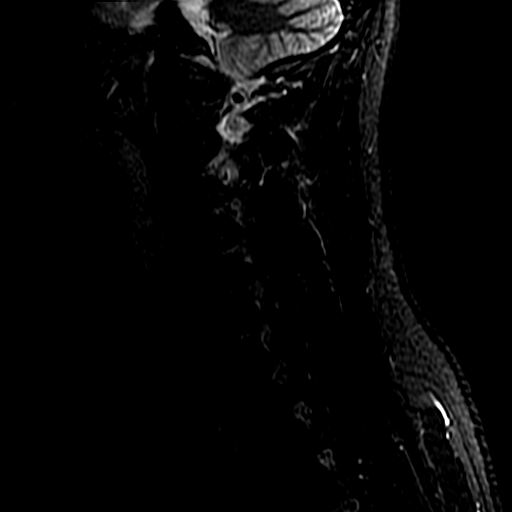
[im 9/15]
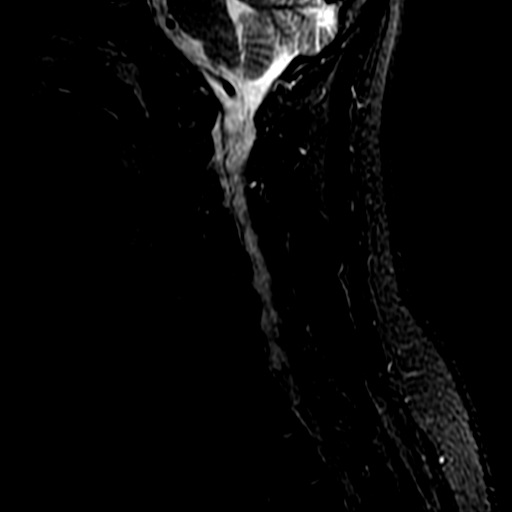
[im 15/15]
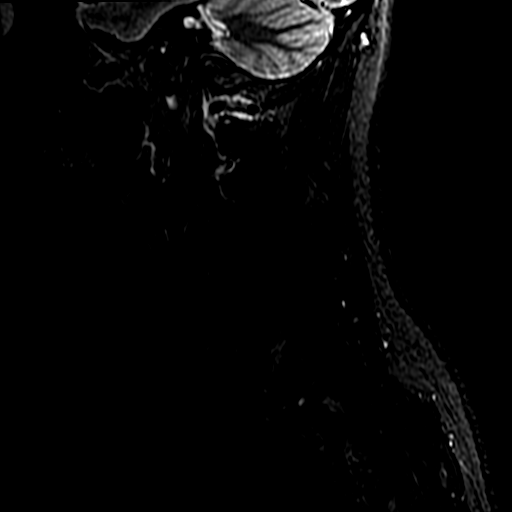

[Series 7: T2 · axial · 3.1mm · 0.37mm/px · z∈[-94,+9]mm · 7 of 36 slices shown]
[im 1/36]
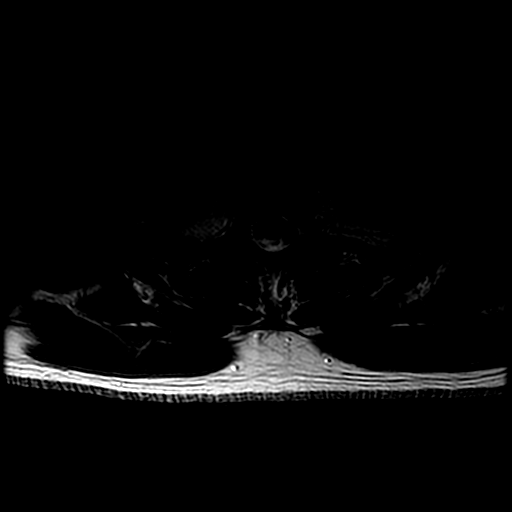
[im 6/36]
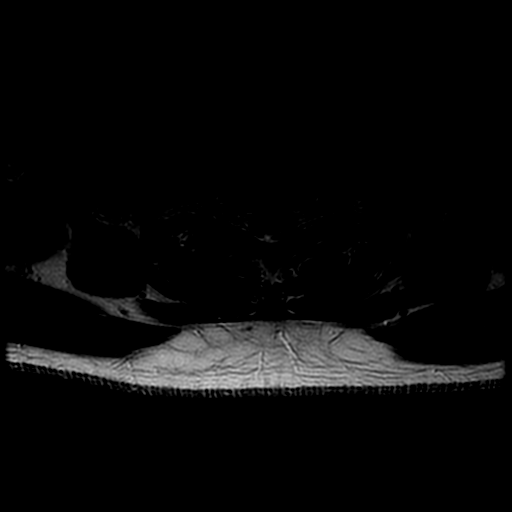
[im 11/36]
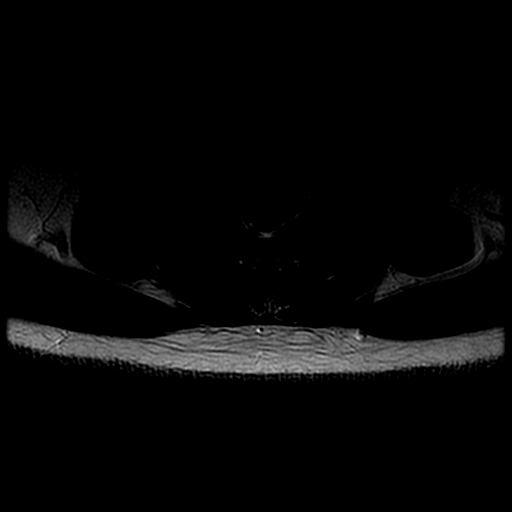
[im 16/36]
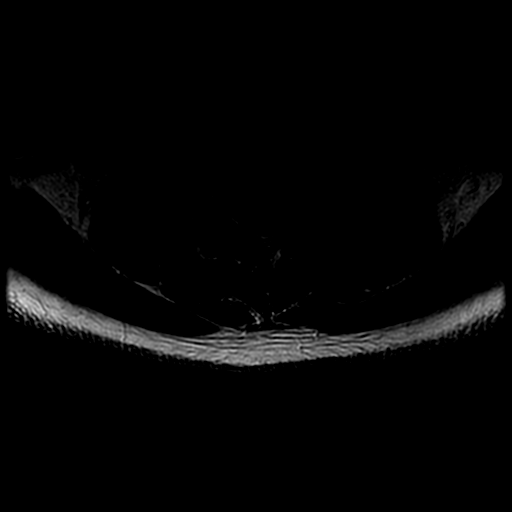
[im 18/36]
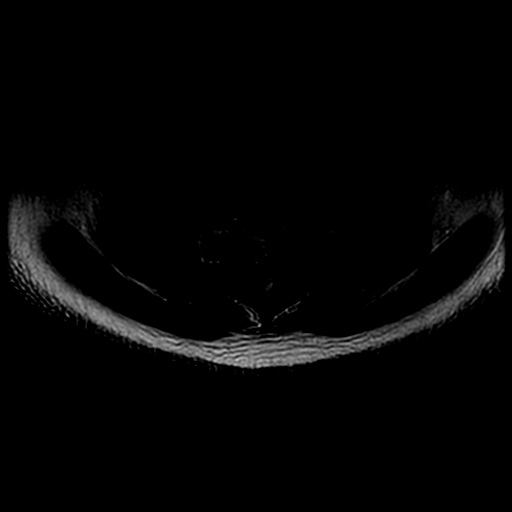
[im 21/36]
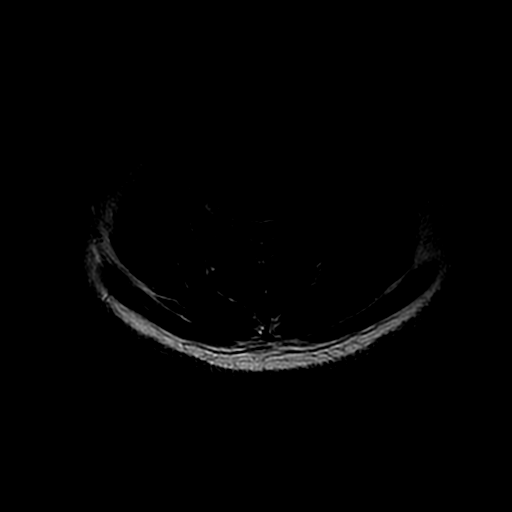
[im 31/36]
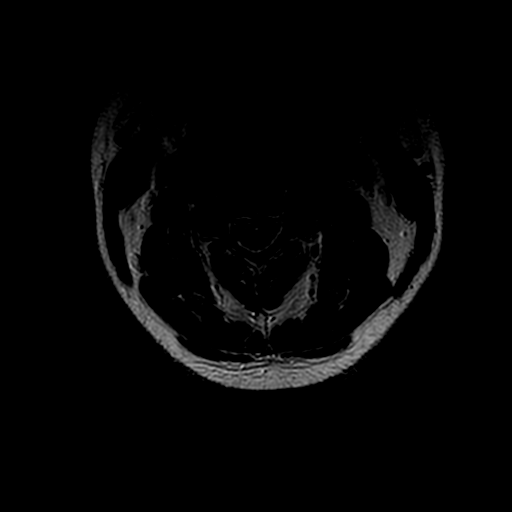

[19 of 48 positions shown; findings below may reference images not displayed]

FINDINGS: The study is mildly motion degraded.

There is mild reversal of the normal cervical lordosis. There is no
listhesis. Vertebral body height are preserved. Minimal disc space
narrowing is noted at C5-6. Vertebral bone marrow signal is
unremarkable. Craniocervical junction is unremarkable. The cervical
spinal cord is normal in caliber and signal. Paraspinal soft tissues
are unremarkable.

C2-3:  Mild left facet arthrosis without stenosis.

C3-4: Mild left facet arthrosis results in mild left neural
foraminal stenosis. No spinal stenosis.

C4-5: Minimal bilateral neural foraminal narrowing due to slight
uncovertebral spurring. No spinal stenosis.

C5-6: Minimal bilateral neural foraminal narrowing due to slight
uncovertebral spurring and minimal disc bulging. No spinal stenosis.

C6-7:  At most minimal disc bulging without stenosis.

C7-T1:  Negative.
IMPRESSION: Mild cervical spondylosis and facet arthrosis with at most mild
neural foraminal narrowing as above.

## 2017-03-31 ENCOUNTER — Other Ambulatory Visit: Payer: Self-pay

## 2017-03-31 ENCOUNTER — Encounter (HOSPITAL_COMMUNITY): Payer: Self-pay

## 2017-03-31 ENCOUNTER — Emergency Department (HOSPITAL_COMMUNITY): Payer: 59

## 2017-03-31 ENCOUNTER — Emergency Department (HOSPITAL_COMMUNITY)
Admission: EM | Admit: 2017-03-31 | Discharge: 2017-03-31 | Disposition: A | Discharge status: Home / Self Care | Payer: 59 | Attending: Emergency Medicine | Admitting: Emergency Medicine

## 2017-03-31 DIAGNOSIS — J209 Acute bronchitis, unspecified: Secondary | ICD-10-CM | POA: Insufficient documentation

## 2017-03-31 DIAGNOSIS — E1165 Type 2 diabetes mellitus with hyperglycemia: Secondary | ICD-10-CM | POA: Diagnosis not present

## 2017-03-31 DIAGNOSIS — E119 Type 2 diabetes mellitus without complications: Secondary | ICD-10-CM

## 2017-03-31 DIAGNOSIS — R1030 Lower abdominal pain, unspecified: Secondary | ICD-10-CM | POA: Diagnosis present

## 2017-03-31 DIAGNOSIS — L02214 Cutaneous abscess of groin: Secondary | ICD-10-CM | POA: Insufficient documentation

## 2017-03-31 DIAGNOSIS — R05 Cough: Secondary | ICD-10-CM | POA: Diagnosis not present

## 2017-03-31 DIAGNOSIS — J208 Acute bronchitis due to other specified organisms: Secondary | ICD-10-CM

## 2017-03-31 DIAGNOSIS — F17210 Nicotine dependence, cigarettes, uncomplicated: Secondary | ICD-10-CM | POA: Diagnosis not present

## 2017-03-31 DIAGNOSIS — R739 Hyperglycemia, unspecified: Secondary | ICD-10-CM

## 2017-03-31 LAB — URINALYSIS, ROUTINE W REFLEX MICROSCOPIC
BILIRUBIN URINE: NEGATIVE
Bacteria, UA: NONE SEEN
Hgb urine dipstick: NEGATIVE
KETONES UR: NEGATIVE mg/dL
LEUKOCYTES UA: NEGATIVE
Nitrite: NEGATIVE
PH: 6 (ref 5.0–8.0)
Protein, ur: NEGATIVE mg/dL
Specific Gravity, Urine: 1.018 (ref 1.005–1.030)
Squamous Epithelial / LPF: NONE SEEN

## 2017-03-31 LAB — BLOOD GAS, VENOUS
ACID-BASE EXCESS: 0.1 mmol/L (ref 0.0–2.0)
Bicarbonate: 24.3 mmol/L (ref 20.0–28.0)
O2 Saturation: 91.6 %
Patient temperature: 98.6
pCO2, Ven: 39.8 mmHg — ABNORMAL LOW (ref 44.0–60.0)
pH, Ven: 7.402 (ref 7.250–7.430)
pO2, Ven: 61.2 mmHg — ABNORMAL HIGH (ref 32.0–45.0)

## 2017-03-31 LAB — COMPREHENSIVE METABOLIC PANEL
ALT: 17 U/L (ref 17–63)
ANION GAP: 7 (ref 5–15)
AST: 17 U/L (ref 15–41)
Albumin: 4.1 g/dL (ref 3.5–5.0)
Alkaline Phosphatase: 62 U/L (ref 38–126)
BUN: 9 mg/dL (ref 6–20)
CALCIUM: 8.5 mg/dL — AB (ref 8.9–10.3)
CHLORIDE: 105 mmol/L (ref 101–111)
CO2: 24 mmol/L (ref 22–32)
CREATININE: 0.55 mg/dL — AB (ref 0.61–1.24)
GFR calc Af Amer: >60 mL/min (ref >60)
Glucose, Bld: 274 mg/dL — ABNORMAL HIGH (ref 65–99)
Potassium: 3.9 mmol/L (ref 3.5–5.1)
Sodium: 136 mmol/L (ref 135–145)
Total Bilirubin: 0.6 mg/dL (ref 0.3–1.2)
Total Protein: 6.9 g/dL (ref 6.5–8.1)

## 2017-03-31 LAB — CBC WITH DIFFERENTIAL/PLATELET
Basophils Absolute: 0.1 10*3/uL (ref 0.0–0.1)
Basophils Relative: 1 %
EOS ABS: 0.1 10*3/uL (ref 0.0–0.7)
EOS PCT: 2 %
HCT: 43.8 % (ref 39.0–52.0)
Hemoglobin: 15.8 g/dL (ref 13.0–17.0)
LYMPHS PCT: 39 %
Lymphs Abs: 2.2 10*3/uL (ref 0.7–4.0)
MCH: 32.7 pg (ref 26.0–34.0)
MCHC: 36.1 g/dL — ABNORMAL HIGH (ref 30.0–36.0)
MCV: 90.7 fL (ref 78.0–100.0)
MONO ABS: 0.6 10*3/uL (ref 0.1–1.0)
Monocytes Relative: 11 %
NEUTROS PCT: 47 %
Neutro Abs: 2.6 10*3/uL (ref 1.7–7.7)
PLATELETS: 169 10*3/uL (ref 150–400)
RBC: 4.83 MIL/uL (ref 4.22–5.81)
RDW: 12.2 % (ref 11.5–15.5)
WBC: 5.6 10*3/uL (ref 4.0–10.5)

## 2017-03-31 MED ORDER — SULFAMETHOXAZOLE-TRIMETHOPRIM 800-160 MG PO TABS
1.0000 | ORAL_TABLET | Freq: Once | ORAL | Status: AC
  Administered 2017-03-31: 1 via ORAL
  Filled 2017-03-31: qty 1

## 2017-03-31 MED ORDER — KETOROLAC TROMETHAMINE 30 MG/ML IJ SOLN
15.0000 mg | Freq: Once | INTRAMUSCULAR | Status: AC
  Administered 2017-03-31: 15 mg via INTRAVENOUS
  Filled 2017-03-31: qty 1

## 2017-03-31 MED ORDER — SULFAMETHOXAZOLE-TRIMETHOPRIM 800-160 MG PO TABS: 1.0000 | ORAL_TABLET | Freq: Two times a day (BID) | ORAL | 0 refills | Status: AC

## 2017-03-31 MED ORDER — ALBUTEROL SULFATE HFA 108 (90 BASE) MCG/ACT IN AERS
2.0000 | INHALATION_SPRAY | Freq: Four times a day (QID) | RESPIRATORY_TRACT | Status: DC
  Administered 2017-03-31: 2 via RESPIRATORY_TRACT
  Filled 2017-03-31: qty 6.7

## 2017-03-31 MED ORDER — SODIUM CHLORIDE 0.9 % IV BOLUS (SEPSIS)
1000.0000 mL | Freq: Once | INTRAVENOUS | Status: AC
  Administered 2017-03-31: 1000 mL via INTRAVENOUS

## 2017-03-31 MED ORDER — CLOTRIMAZOLE 1 % EX CREA: TOPICAL_CREAM | CUTANEOUS | 0 refills | Status: DC

## 2017-03-31 MED ORDER — ALBUTEROL SULFATE (2.5 MG/3ML) 0.083% IN NEBU
5.0000 mg | INHALATION_SOLUTION | Freq: Once | RESPIRATORY_TRACT | Status: AC
  Administered 2017-03-31: 5 mg via RESPIRATORY_TRACT
  Filled 2017-03-31: qty 6

## 2017-03-31 MED ORDER — IPRATROPIUM-ALBUTEROL 0.5-2.5 (3) MG/3ML IN SOLN
3.0000 mL | Freq: Once | RESPIRATORY_TRACT | Status: AC
  Administered 2017-03-31: 3 mL via RESPIRATORY_TRACT
  Filled 2017-03-31: qty 3

## 2017-03-31 MED ORDER — DEXAMETHASONE SODIUM PHOSPHATE 10 MG/ML IJ SOLN
10.0000 mg | Freq: Once | INTRAMUSCULAR | Status: AC
  Administered 2017-03-31: 10 mg via INTRAVENOUS
  Filled 2017-03-31: qty 1

## 2017-03-31 MED ORDER — METFORMIN HCL 1000 MG PO TABS: 500.0000 mg | ORAL_TABLET | Freq: Two times a day (BID) | ORAL | 0 refills | Status: DC

## 2017-03-31 MED ORDER — PREDNISONE 20 MG PO TABS: 40.0000 mg | ORAL_TABLET | Freq: Every day | ORAL | 0 refills | Status: DC

## 2017-03-31 NOTE — Discharge Instructions (Signed)
As discussed, please take all medication as directed, and monitor your condition carefully. Return here for concerning changes in your condition.

## 2017-03-31 NOTE — ED Triage Notes (Signed)
Left groin redness for about a month been on antibiotics but not getting any better and not treating his diabetes, also cough and congestion about 3-4 days.

## 2017-03-31 NOTE — ED Provider Notes (Signed)
[facility]-EMERGENCY DEPT Provider Note   CSN: [id number] Arrival date & time: 03/31/17  [id number]     History   Chief Complaint Chief Complaint  Patient presents with  . Groin Pain    HPI Edward [name] is a 42 y.o. male.  HPI Patient presents due to concern of pain in his left groin, with a visible lesion, as well as worsening cough, generalized discomfort. Patient acknowledges a history of diabetes, notes that he does not take medication regularly. Is to chief areas of concern are the groin wound and his chest discomfort, cough. Several weeks ago the patient noticed a lesion in the left inferior inguinal crease. He was seen at our affiliated facility, started on antibiotics, notes that he was transiently improved. However, over the past few days the patient has noticed new redness, swelling, hardening in the skin in the inferior left inguinal crease. No scrotal pain, swelling, no dysuria, hematuria. Of about the same timeframe the patient has developed URI-like illness, with persistent cough, generalized discomfort, sinus congestion, head fullness, and headache.  His wife has a similar illness. Patient has some improvement transiently with OTC cough medication. However, overall, symptoms are progressive. Patient is a smoker, though has not smoked in several days.   Past Medical History:  Diagnosis Date  . Abscess   . Chicken pox   . 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 Facial pain   . Hyperlipemia   . Increased frequency of headaches   . Migraine     Patient Active Problem List   Diagnosis Date Noted  . Polypharmacy 03/11/2016  . Trigeminal neuralgia pain 12/09/2015  . Scrotal abscess 10/10/2015  . Chronic pain of right lower extremity 11/16/2013  . DM (diabetes mellitus), type 2 (HCC) 04/12/2012  . Migraine headache 04/12/2012  . Soft tissue mass 04/12/2012  . Tobacco use disorder 04/12/2012    Past  Surgical History:  Procedure Laterality Date  . I&D EXTREMITY Right 05/08/2015   Procedure: IRRIGATION AND DEBRIDEMENT THUMB;  Surgeon: Bradly BienenstockFred Ortmann, MD;  Location: [facility] OR;  Service: Orthopedics;  Laterality: Right;  . INCISE AND DRAIN ABCESS     left hand and jaw  . IRRIGATION AND DEBRIDEMENT ABSCESS N/A 10/10/2015   Procedure: IRRIGATION AND DEBRIDEMENT SCROTAL ABSCESS;  Surgeon: [name], MD;  Location: [facility] ORS;  Service: Urology;  Laterality: N/A;       Home Medications    Prior to Admission medications   Medication Sig Start Date End Date Taking? Authorizing Provider  acetaminophen (TYLENOL) 500 MG tablet Take 1,000 mg by mouth every 6 (six) hours as needed for mild pain.   Yes [provider]  guaifenesin (ROBITUSSIN) 100 MG/5ML syrup Take 200 mg by mouth 3 (three) times daily as needed for cough.   Yes [provider]  clindamycin (CLEOCIN) 300 MG capsule Take 1 capsule (300 mg total) by mouth 3 (three) times daily. Patient not taking: Reported on 03/31/2017 01/30/17   Irean HongSung, Jade J, MD  cyclobenzaprine (FLEXERIL) 10 MG tablet Take 1 tablet (10 mg total) by mouth 3 (three) times daily as needed for muscle spasms. Patient not taking: Reported on 03/31/2017 08/11/16   McVey, Madelaine BhatElizabeth Whitney, PA-C  erythromycin ophthalmic ointment Place a 1/2 inch ribbon of ointment into the lower eyelid 4 times daily for 7 days. Patient not taking: Reported on 03/31/2017 01/28/17   Michela PitcherFawze, Mina A, PA-C  fluconazole (DIFLUCAN) 150 MG tablet Take 1 tablet (  150 mg total) by mouth daily. Patient not taking: Reported on 03/31/2017 08/11/16   McVey, Madelaine Bhat, PA-C  gabapentin (NEURONTIN) 300 MG capsule TAKE 3 CAPSULES BY MOUTH 4 TIMES DAILY. Patient not taking: Reported on 03/31/2017 12/30/16   Levert Feinstein, MD  ibuprofen (ADVIL,MOTRIN) 800 MG tablet Take 1 tablet (800 mg total) by mouth every 8 (eight) hours as needed for moderate pain. Patient not taking: Reported on 03/31/2017 01/30/17    Irean Hong, MD  meloxicam (MOBIC) 15 MG tablet Take 1 tablet (15 mg total) by mouth daily. Patient not taking: Reported on 03/31/2017 08/11/16   McVey, Madelaine Bhat, PA-C  metFORMIN (GLUCOPHAGE) 1000 MG tablet Take 1 tablet (1,000 mg total) by mouth 2 (two) times daily with a meal. Patient not taking: Reported on 08/11/2016 02/25/14   Wallis Bamberg, PA-C  oxyCODONE-acetaminophen (ROXICET) 5-325 MG tablet Take 1 tablet by mouth every 4 (four) hours as needed for severe pain. Patient not taking: Reported on 03/31/2017 01/30/17   Irean Hong, MD    Family History Family History  Problem Relation Age of Onset  . Heart disease Mother   . Diabetes Mother   . Multiple sclerosis Mother   . Vascular Disease Mother   . Hyperlipidemia Father   . Stroke Father   . Hypertension Father   . Lung cancer Father   . Diabetes Father   . Cancer Paternal Grandfather        prostate  . Heart disease Paternal Uncle        2 paternal uncles have stents    Social History Social History   Tobacco Use  . Smoking status: Current Every Day Smoker    Packs/day: 1.00    Years: 31.00    Pack years: 31.00    Types: Cigarettes  . Smokeless tobacco: Never Used  Substance Use Topics  . Alcohol use: Yes    Alcohol/week: 0.0 oz    Comment: Social  . Drug use: No     Allergies   Patient has no known allergies.   Review of Systems Review of Systems  Constitutional:       Per HPI, otherwise negative  HENT:       Per HPI, otherwise negative  Respiratory:       Per HPI, otherwise negative  Cardiovascular:       Per HPI, otherwise negative  Gastrointestinal: Negative for vomiting.  Endocrine:       Negative aside from HPI  Genitourinary:       Neg aside from HPI   Musculoskeletal:       Per HPI, otherwise negative  Skin: Positive for color change and wound.  Allergic/Immunologic: Negative for immunocompromised state.  Neurological: Positive for weakness. Negative for syncope.      Physical Exam Updated Vital Signs BP 123/73   Pulse 92   Temp 98.4 F (36.9 C) (Oral)   Resp 20   Ht 5\' 10"  (1.778 m)   Wt 79.8 kg (176 lb)   SpO2 90%   BMI 25.25 kg/m   Physical Exam  Constitutional: He is oriented to person, place, and time. He appears well-developed. No distress.  HENT:  Head: Normocephalic and atraumatic.  Eyes: Conjunctivae and EOM are normal.  Cardiovascular: Normal rate and regular rhythm.  Pulmonary/Chest: No stridor. Tachypnea noted. He has decreased breath sounds in the left upper field, the left middle field and the left lower field. He has wheezes in the left upper field, the left middle field  and the left lower field.  Abdominal: He exhibits no distension.  Genitourinary:     Musculoskeletal: He exhibits no edema.  Neurological: He is alert and oriented to person, place, and time.  Skin: Skin is warm and dry.  Psychiatric: He has a normal mood and affect.  Nursing note and vitals reviewed.    ED Treatments / Results  Labs (all labs ordered are listed, but only abnormal results are displayed) Labs Reviewed  COMPREHENSIVE METABOLIC PANEL - Abnormal; Notable for the following components:      Result Value   Glucose, Bld 274 (*)    Creatinine, Ser 0.55 (*)    Calcium 8.5 (*)    All other components within normal limits  CBC WITH DIFFERENTIAL/PLATELET - Abnormal; Notable for the following components:   MCHC 36.1 (*)    All other components within normal limits  BLOOD GAS, VENOUS - Abnormal; Notable for the following components:   pCO2, Ven 39.8 (*)    pO2, Ven 61.2 (*)    All other components within normal limits  AEROBIC CULTURE (SUPERFICIAL SPECIMEN)  URINALYSIS, ROUTINE W REFLEX MICROSCOPIC    Radiology Dg Chest 2 View  Result Date: 03/31/2017 CLINICAL DATA:  Cough and congestion for 3-4 days. Shortness of breath. Decreased left breath sounds. History of smoking. EXAM: CHEST  2 VIEW COMPARISON:  10/10/2015 FINDINGS: The  cardiomediastinal silhouette is within normal limits. The lungs are well inflated and clear. There is no evidence of pleural effusion or pneumothorax. No acute osseous abnormality is identified. IMPRESSION: No active cardiopulmonary disease. Electronically Signed   By: Sebastian Ache M.D.   On: 03/31/2017 09:15    Procedures Procedures (including critical care time)  Medications Ordered in ED Medications  ipratropium-albuterol (DUONEB) 0.5-2.5 (3) MG/3ML nebulizer solution 3 mL (not administered)  sodium chloride 0.9 % bolus 1,000 mL (1,000 mLs Intravenous New Bag/Given 03/31/17 0823)  ketorolac (TORADOL) 30 MG/ML injection 15 mg (15 mg Intravenous Given 03/31/17 0822)  albuterol (PROVENTIL) (2.5 MG/3ML) 0.083% nebulizer solution 5 mg (5 mg Nebulization Given 03/31/17 0822)  dexamethasone (DECADRON) injection 10 mg (10 mg Intravenous Given 03/31/17 0822)  sulfamethoxazole-trimethoprim (BACTRIM DS,SEPTRA DS) 800-160 MG per tablet 1 tablet (1 tablet Oral Given 03/31/17 1610)     Initial Impression / Assessment and Plan / ED Course  I have reviewed the triage vital signs and the nursing notes.  Pertinent labs & imaging results that were available during my care of the patient were reviewed by me and considered in my medical decision making (see chart for details).     9:48 AM Patient appears better, states that he feels better, but has persistent abnormal breath sounds. DuoNeb pending.  11:27 AM Patient awake alert, appears slightly better after second nebulizer session. We had a lengthy conversation about all findings, including reassuring x-ray, labs. Given the patient's change in breath sounds, smoking history, will he will be treated empirically for bronchitis. In addition, given the patient's medication noncompliance he will receive a prescription for his metformin. With concern for his groin wound, but no evidence for deep space infection, nor abscess amenable to incision, patient will  receive antibiotics, and topical yeast cream for suspicion of coinfection. Patient remains hemodynamically stable, awake, alert, in no distress. Patient's findings discussed with his wife as well. Patient discharged in stable condition.  Final Clinical Impressions(s) / ED Diagnoses  Acute bronchitis Groin abscess hyperglycemia   Gerhard Munch, MD 03/31/17 1129

## 2017-04-02 LAB — AEROBIC CULTURE W GRAM STAIN (SUPERFICIAL SPECIMEN)
Gram Stain: NONE SEEN
Special Requests: NORMAL

## 2017-04-03 ENCOUNTER — Telehealth: Payer: Self-pay

## 2017-04-03 NOTE — Progress Notes (Signed)
ED Antimicrobial Stewardship Positive Culture Follow Up   Edward HummerCharles B Bold is an 42 y.o. male who presented [facility]2017 with a chief complaint of  Chief Complaint  Patient presents with  . Groin Pain    Recent Results (from the past 720 hour(s))  Wound or Superficial Culture     Status: None   Collection Time: 03/31/17  8:24 AM  Result Value Ref Range Status   Specimen Description   Final    WOUND LEFT GROIN Performed at [facility], [address] [place], [place] [address]    Special Requests   Final    Normal Performed at [facility], [address] [place], [place] [address]    Gram Stain   Final    NO WBC SEEN RARE GRAM POSITIVE COCCI IN PAIRS RARE GRAM VARIABLE ROD    Culture   Final    MODERATE GROUP B STREP(S.AGALACTIAE)ISOLATED TESTING AGAINST S. AGALACTIAE NOT ROUTINELY PERFORMED DUE TO PREDICTABILITY OF AMP/PEN/VAN SUSCEPTIBILITY. Performed at [facility], [address] [place], [place] [address]    Report Status 04/02/2017 FINAL  Final    [x]  Treated with Bactrim, organism resistant to prescribed antimicrobial   New antibiotic prescription:  Stop Bactrim Start cephalexin 500 mg BID X 7 days   ED Provider: Darnelle CatalanEmily Shroesbee, PA-C   Della GooEmily S Sinclair, PharmD 04/03/2017, 9:16 AM Infectious Diseases Pharmacy Resident Phone# [phone]

## 2017-04-03 NOTE — Telephone Encounter (Signed)
Post ED Visit - Positive Culture Follow-up: Successful Patient Follow-Up  Culture assessed and recommendations reviewed by: []  Enzo BiNathan Batchelder, Pharm.D. []  Celedonio MiyamotoJeremy Frens, Pharm.D., BCPS AQ-ID []  Garvin FilaMike Maccia, Pharm.D., BCPS []  Georgina PillionElizabeth Martin, [address] BCPS []  LibertyMinh Pham, [address]., BCPS, AAHIVP []  Estella HuskMichelle Turner, Pharm.D., BCPS, AAHIVP []  Lysle Pearlachel Rumbarger, PharmD, BCPS []  Casilda Carlsaylor Stone, PharmD, BCPS []  Pollyann SamplesAndy Johnston, PharmD, BCPS Sharin MonsEmily Sinclair Positive aerobic abcess culture  []  Patient discharged without antimicrobial prescription and treatment is now indicated [x]  Organism is resistant to prescribed ED discharge antimicrobial []  Patient with positive blood cultures  Changes discussed with ED provider: Shirlyn GoltzEmily Shrosbree PA-C New antibiotic prescription Cephalexin 500mg  BID x 7 days [facility] outpt pharmacy [phone]  Contacted patient, date 04/03/17, time 1132   Josh Nicolosi, Linnell FullingRose Burnett 04/03/2017, 11:30 AM

## 2017-04-11 MED FILL — CEPHALEXIN 500 MG CAPSULE: 500 | 7 days supply | Qty: 14 | Fill #0

## 2017-04-11 MED FILL — FREESTYLE LITE TEST STRIP: 90 days supply | Qty: 100 | Fill #2

## 2017-04-11 MED FILL — GABAPENTIN 300 MG CAPS: 300 | 30 days supply | Qty: 360 | Fill #1

## 2017-05-18 ENCOUNTER — Emergency Department (HOSPITAL_COMMUNITY)
Admission: EM | Admit: 2017-05-18 | Discharge: 2017-05-18 | Discharge status: Against Medical Advice | Payer: 59 | Attending: Emergency Medicine | Admitting: Emergency Medicine

## 2017-05-18 ENCOUNTER — Encounter (HOSPITAL_COMMUNITY): Payer: Self-pay | Admitting: Emergency Medicine

## 2017-05-18 ENCOUNTER — Other Ambulatory Visit: Payer: Self-pay

## 2017-05-18 DIAGNOSIS — R197 Diarrhea, unspecified: Secondary | ICD-10-CM | POA: Diagnosis present

## 2017-05-18 DIAGNOSIS — Z5321 Procedure and treatment not carried out due to patient leaving prior to being seen by health care provider: Secondary | ICD-10-CM | POA: Insufficient documentation

## 2017-05-18 MED ORDER — ONDANSETRON 4 MG PO TBDP
4.0000 mg | ORAL_TABLET | Freq: Once | ORAL | Status: AC | PRN
  Administered 2017-05-18: 4 mg via ORAL
  Filled 2017-05-18: qty 1

## 2017-05-18 NOTE — ED Triage Notes (Signed)
Pt states yesterday he started having vomiting and severe diarrhea that was watery  Pt states this morning he continues to have nausea and burning in his stomach  Pt states he had chills yesterday  Pt states he just feels bad and has general weakness

## 2017-05-18 NOTE — ED Notes (Signed)
Royce RN attempted to call patient back to room 3 times. Patient did not respond.

## 2017-05-18 NOTE — ED Notes (Signed)
Bed: WA03 Expected date:  Expected time:  Means of arrival:  Comments: 

## 2017-05-25 ENCOUNTER — Ambulatory Visit (INDEPENDENT_AMBULATORY_CARE_PROVIDER_SITE_OTHER): Payer: 59 | Admitting: Family Medicine

## 2017-05-25 ENCOUNTER — Encounter: Payer: Self-pay | Admitting: Family Medicine

## 2017-05-25 VITALS — BP 118/80 | HR 83 | Temp 97.6°F | Ht 67.5 in | Wt 171.0 lb

## 2017-05-25 DIAGNOSIS — Z7689 Persons encountering health services in other specified circumstances: Secondary | ICD-10-CM | POA: Diagnosis not present

## 2017-05-25 DIAGNOSIS — E119 Type 2 diabetes mellitus without complications: Secondary | ICD-10-CM

## 2017-05-25 DIAGNOSIS — Z13220 Encounter for screening for lipoid disorders: Secondary | ICD-10-CM

## 2017-05-25 LAB — CBC
HEMATOCRIT: 48.2 % (ref 39.0–52.0)
Hemoglobin: 16.7 g/dL (ref 13.0–17.0)
MCHC: 34.6 g/dL (ref 30.0–36.0)
MCV: 93.9 fl (ref 78.0–100.0)
Platelets: 289 10*3/uL (ref 150.0–400.0)
RBC: 5.13 Mil/uL (ref 4.22–5.81)
RDW: 13.2 % (ref 11.5–15.5)
WBC: 13.3 10*3/uL — AB (ref 4.0–10.5)

## 2017-05-25 LAB — LIPID PANEL
CHOL/HDL RATIO: 6
CHOLESTEROL: 182 mg/dL (ref 0–200)
HDL: 30.9 mg/dL — ABNORMAL LOW (ref >39.00)
LDL CALC: 125 mg/dL — AB (ref 0–99)
NONHDL: 151.35
Triglycerides: 131 mg/dL (ref 0.0–149.0)
VLDL: 26.2 mg/dL (ref 0.0–40.0)

## 2017-05-25 LAB — COMPREHENSIVE METABOLIC PANEL
ALK PHOS: 71 U/L (ref 39–117)
ALT: 26 U/L (ref 0–53)
AST: 14 U/L (ref 0–37)
Albumin: 4.3 g/dL (ref 3.5–5.2)
BILIRUBIN TOTAL: 0.4 mg/dL (ref 0.2–1.2)
BUN: 9 mg/dL (ref 6–23)
CO2: 26 mEq/L (ref 19–32)
CREATININE: 0.59 mg/dL (ref 0.40–1.50)
Calcium: 9.3 mg/dL (ref 8.4–10.5)
Chloride: 105 mEq/L (ref 96–112)
GFR: 160.19 mL/min (ref >60.00)
Glucose, Bld: 205 mg/dL — ABNORMAL HIGH (ref 70–99)
Potassium: 4.6 mEq/L (ref 3.5–5.1)
SODIUM: 139 meq/L (ref 135–145)
TOTAL PROTEIN: 7 g/dL (ref 6.0–8.3)

## 2017-05-25 LAB — GLUCOSE, POCT (MANUAL RESULT ENTRY): POC Glucose: 187 mg/dl — AB (ref 70–99)

## 2017-05-25 LAB — HEMOGLOBIN A1C: Hgb A1c MFr Bld: 10.5 % — ABNORMAL HIGH (ref 4.6–6.5)

## 2017-05-25 MED ORDER — BLOOD GLUCOSE MONITOR KIT: PACK | 0 refills | Status: DC

## 2017-05-25 MED ORDER — BLOOD GLUCOSE MONITOR KIT: PACK | 0 refills | Status: AC

## 2017-05-25 MED ORDER — METFORMIN HCL ER 500 MG PO TB24: 500.0000 mg | ORAL_TABLET | Freq: Every day | ORAL | 3 refills | Status: AC

## 2017-05-25 NOTE — Progress Notes (Signed)
Patient presents to clinic today to follow-up and to establish care.  He is accompanied by his wife.  SUBJECTIVE: PMH: Pt is a 42 yo male with pmh sig for DM, migraines, trigeminal neuralgia, ED.  In the past pt was seen at [facility].  Pt has also been followed by Neurology, Dr. Krista Blue for migraines.    DM II: -in the past pt was seen by Dr. Cruzita Lederer once -pt recently restarted taking metformin as it was prescribed by the emergency department -Patient states metformin makes him nauseous and he typically does not take it. -In the past patient was also given glipizide but has not been taking that either. -Patient states he does not check his blood sugar at home. -Patient states he wants to get more serious about his health.  Trigeminal neuralgia -Patient states a few years ago he had shingles developed trigeminal neuralgia as a result. -Patient states he had facial droop for several months which is finally resolving -Patient was followed by neurology for this -Occasionally patient will take gabapentin for jaw or face pain secondary to this condition.  Allergies: NKDA  Past surgical history: None  Social history: Patient is married.  He is currently employed as an Clinical biochemist.  Patient does not drink alcohol.  Patient endorses tobacco use, 1 pack/day since the age of 68 or 48.  Patient is not interested in quitting at this time but states he is willing to consider cutting down.  Patient denies drug use.  Family medical history: Mom-multiple sclerosis, depression, early death, diabetes, heart disease, HLD Dad-arthritis, lung cancer, diabetes, early death, HLD, HTN, stroke  Past Medical History:  Diagnosis Date  . Abscess   . Chicken pox   . 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 Facial pain   . Hyperlipemia   . Increased frequency of headaches   . Migraine     Past Surgical History:  Procedure Laterality Date  . I&D EXTREMITY Right 05/08/2015   Procedure: IRRIGATION AND DEBRIDEMENT THUMB;  Surgeon: Iran Planas, MD;  Location: [place];  Service: Orthopedics;  Laterality: Right;  . INCISE AND DRAIN ABCESS     left hand and jaw  . IRRIGATION AND DEBRIDEMENT ABSCESS N/A 10/10/2015   Procedure: IRRIGATION AND DEBRIDEMENT SCROTAL ABSCESS;  Surgeon: Alexis Frock, MD;  Location: [facility];  Service: Urology;  Laterality: N/A;    No current outpatient medications on file prior to visit.   No current facility-administered medications on file prior to visit.     No Known Allergies  Family History  Problem Relation Age of Onset  . Heart disease Mother   . Diabetes Mother   . Multiple sclerosis Mother   . Vascular Disease Mother   . Hyperlipidemia Father   . Stroke Father   . Hypertension Father   . Lung cancer Father   . Diabetes Father   . Cancer Paternal Grandfather        prostate  . Heart disease Paternal Uncle        2 paternal uncles have stents    Social History   Socioeconomic History  . Marital status: Married    Spouse name: Deirdre Exie Parody  . Number of children: 0  . Years of education: GED  . Highest education level: Not on file  Occupational History  . Occupation: Research scientist (physical sciences): [employer]   Social Needs  . Financial resource strain: Not on file  . Food insecurity:  Worry: Not on file    Inability: Not on file  . Transportation needs:    Medical: Not on file    Non-medical: Not on file  Tobacco Use  . Smoking status: Current Every Day Smoker    Packs/day: 1.00    Years: 31.00    Pack years: 31.00    Types: Cigarettes  . Smokeless tobacco: Never Used  Substance and Sexual Activity  . Alcohol use: No    Alcohol/week: 0.0 oz    Frequency: Never  . Drug use: No  . Sexual activity: Yes    Birth control/protection: None  Lifestyle  . Physical activity:    Days per week: Not on file    Minutes per session: Not on file  . Stress: Not on file  Relationships  . Social connections:    Talks on  phone: Not on file    Gets together: Not on file    Attends religious service: Not on file    Active member of club or organization: Not on file    Attends meetings of clubs or organizations: Not on file    Relationship status: Not on file  . Intimate partner violence:    Fear of current or ex partner: Not on file    Emotionally abused: Not on file    Physically abused: Not on file    Forced sexual activity: Not on file  Other Topics Concern  . Not on file  Social History Narrative   Lives at home with wife and sister.   Right-handed.   2 cups caffeine daily.    ROS General: Denies fever, chills, night sweats, changes in weight, changes in appetite HEENT: Denies headaches, ear pain, changes in vision, rhinorrhea, sore throat CV: Denies CP, palpitations, SOB, orthopnea Pulm: Denies SOB, cough, wheezing GI: Denies abdominal pain, nausea, vomiting, diarrhea, constipation GU: Denies dysuria, hematuria, frequency, vaginal discharge  +ED Msk: Denies muscle cramps, joint pains Neuro: Denies weakness, numbness, tingling Skin: Denies rashes, bruising Psych: Denies depression, anxiety, hallucinations  BP 118/80 (BP Location: Left Arm, Patient Position: Sitting, Cuff Size: Normal)   Pulse 83   Temp 97.6 F (36.4 C) (Oral)   Ht 5' 7.5" (1.715 m)   Wt 171 lb (77.6 kg)   SpO2 98%   BMI 26.39 kg/m   Physical Exam Gen. Pleasant, well developed, well-nourished, in NAD HEENT - [place]/AT, PERRL, no scleral icterus, no nasal drainage, pharynx without erythema or exudate.   TMs normal bilaterally.  No cervical lymphadenopathy.   Neck: No JVD, no thyromegaly, no carotid bruits Lungs: no use of accessory muscles, CTAB, no wheezes, rales or rhonchi Cardiovascular: RRR,  No r/g/m, no peripheral edema Abdomen: BS present, soft, nontender, nondistended, no hepatosplenomegaly Neuro:  A&Ox3, CN II-XII intact, normal gait Skin:  Warm, dry, intact, no lesions Psych: normal affect, mood  appropriate  Recent Results (from the past 2160 hour(s))  Wound or Superficial Culture     Status: None   Collection Time: 03/31/17  8:24 AM  Result Value Ref Range   Specimen Description      WOUND LEFT GROIN Performed at [facility], [address], [place], [place] [address]    Special Requests      Normal Performed at [facility] [address] [place], [place] [address]    Gram Stain      NO WBC SEEN RARE GRAM POSITIVE COCCI IN PAIRS RARE GRAM VARIABLE ROD    Culture  MODERATE GROUP B STREP(S.AGALACTIAE)ISOLATED TESTING AGAINST S. AGALACTIAE NOT ROUTINELY PERFORMED DUE TO PREDICTABILITY OF AMP/PEN/VAN SUSCEPTIBILITY. Performed at [facility], [place] [address], [place], [place] [address]    Report Status 04/02/2017 FINAL   Comprehensive metabolic panel     Status: Abnormal   Collection Time: 03/31/17  8:27 AM  Result Value Ref Range   Sodium 136 135 - 145 mmol/L   Potassium 3.9 3.5 - 5.1 mmol/L   Chloride 105 101 - 111 mmol/L   CO2 24 22 - 32 mmol/L   Glucose, Bld 274 (H) 65 - 99 mg/dL   BUN 9 6 - 20 mg/dL   Creatinine, Ser 0.55 (L) 0.61 - 1.24 mg/dL   Calcium 8.5 (L) 8.9 - 10.3 mg/dL   Total Protein 6.9 6.5 - 8.1 g/dL   Albumin 4.1 3.5 - 5.0 g/dL   AST 17 15 - 41 U/L   ALT 17 17 - 63 U/L   Alkaline Phosphatase 62 38 - 126 U/L   Total Bilirubin 0.6 0.3 - 1.2 mg/dL   GFR calc non Af Amer >60 >60 mL/min   GFR calc Af Amer >60 >60 mL/min    Comment: (NOTE) The eGFR has been calculated using the CKD EPI equation. This calculation has not been validated in all clinical situations. eGFR's persistently <60 mL/min signify possible Chronic Kidney Disease.    Anion gap 7 5 - 15  CBC with Differential     Status: Abnormal   Collection Time: 03/31/17  8:27 AM  Result Value Ref Range   WBC 5.6 4.0 - 10.5 K/uL   RBC 4.83 4.22 - 5.81 MIL/uL   Hemoglobin 15.8 13.0 - 17.0 g/dL   HCT 43.8 39.0 - 52.0 %   MCV 90.7 78.0 - 100.0 fL   MCH  32.7 26.0 - 34.0 pg   MCHC 36.1 (H) 30.0 - 36.0 g/dL   RDW 12.2 11.5 - 15.5 %   Platelets 169 150 - 400 K/uL   Neutrophils Relative % 47 %   Lymphocytes Relative 39 %   Monocytes Relative 11 %   Eosinophils Relative 2 %   Basophils Relative 1 %   Neutro Abs 2.6 1.7 - 7.7 K/uL   Lymphs Abs 2.2 0.7 - 4.0 K/uL   Monocytes Absolute 0.6 0.1 - 1.0 K/uL   Eosinophils Absolute 0.1 0.0 - 0.7 K/uL   Basophils Absolute 0.1 0.0 - 0.1 K/uL   Smear Review MORPHOLOGY UNREMARKABLE   Blood gas, venous     Status: Abnormal   Collection Time: 03/31/17  8:38 AM  Result Value Ref Range   pH, Ven 7.402 7.250 - 7.430   pCO2, Ven 39.8 (L) 44.0 - 60.0 mmHg   pO2, Ven 61.2 (H) 32.0 - 45.0 mmHg   Bicarbonate 24.3 20.0 - 28.0 mmol/L   Acid-Base Excess 0.1 0.0 - 2.0 mmol/L   O2 Saturation 91.6 %   Patient temperature 98.6    Collection site DRAWN BY RN    Drawn by DRAWN BY RN    Sample type VEIN   Urinalysis, Routine w reflex microscopic     Status: Abnormal   Collection Time: 03/31/17  9:33 AM  Result Value Ref Range   Color, Urine YELLOW YELLOW   APPearance CLEAR CLEAR   Specific Gravity, Urine 1.018 1.005 - 1.030   pH 6.0 5.0 - 8.0   Glucose, UA >=500 (A) NEGATIVE mg/dL   Hgb urine dipstick NEGATIVE NEGATIVE   Bilirubin Urine NEGATIVE NEGATIVE  Ketones, ur NEGATIVE NEGATIVE mg/dL   Protein, ur NEGATIVE NEGATIVE mg/dL   Nitrite NEGATIVE NEGATIVE   Leukocytes, UA NEGATIVE NEGATIVE   RBC / HPF 0-5 0 - 5 RBC/hpf   WBC, UA 0-5 0 - 5 WBC/hpf   Bacteria, UA NONE SEEN NONE SEEN   Squamous Epithelial / LPF NONE SEEN NONE SEEN   Mucus PRESENT   POCT glucose (manual entry)     Status: Abnormal   Collection Time: 05/25/17 11:19 AM  Result Value Ref Range   POC Glucose 187 (A) 70 - 99 mg/dl  Hemoglobin A1c     Status: Abnormal   Collection Time: 05/25/17 12:01 PM  Result Value Ref Range   Hgb A1c MFr Bld 10.5 (H) 4.6 - 6.5 %    Comment: Glycemic Control Guidelines for People with Diabetes:Non  Diabetic:  <6%Goal of Therapy: <7%Additional Action Suggested:  >8%   Lipid panel     Status: Abnormal   Collection Time: 05/25/17 12:01 PM  Result Value Ref Range   Cholesterol 182 0 - 200 mg/dL    Comment: ATP III Classification       Desirable:  < 200 mg/dL               Borderline High:  200 - 239 mg/dL          High:  > = 240 mg/dL   Triglycerides 131.0 0.0 - 149.0 mg/dL    Comment: Normal:  <150 mg/dLBorderline High:  150 - 199 mg/dL   HDL 30.90 (L) >39.00 mg/dL   VLDL 26.2 0.0 - 40.0 mg/dL   LDL Cholesterol 125 (H) 0 - 99 mg/dL   Total CHOL/HDL Ratio 6     Comment:                Men          Women1/2 Average Risk     3.4          3.3Average Risk          5.0          4.42X Average Risk          9.6          7.13X Average Risk          15.0          11.0                       NonHDL 151.35     Comment: NOTE:  Non-HDL goal should be 30 mg/dL higher than patient's LDL goal (i.e. LDL goal of < 70 mg/dL, would have non-HDL goal of < 100 mg/dL)  CBC (no diff)     Status: Abnormal   Collection Time: 05/25/17 12:01 PM  Result Value Ref Range   WBC 13.3 (H) 4.0 - 10.5 K/uL   RBC 5.13 4.22 - 5.81 Mil/uL   Platelets 289.0 150.0 - 400.0 K/uL   Hemoglobin 16.7 13.0 - 17.0 g/dL   HCT 48.2 39.0 - 52.0 %   MCV 93.9 78.0 - 100.0 fl   MCHC 34.6 30.0 - 36.0 g/dL   RDW 13.2 11.5 - 15.5 %  Comprehensive metabolic panel     Status: Abnormal   Collection Time: 05/25/17 12:01 PM  Result Value Ref Range   Sodium 139 135 - 145 mEq/L   Potassium 4.6 3.5 - 5.1 mEq/L   Chloride 105 96 - 112 mEq/L   CO2 26 19 - 32  mEq/L   Glucose, Bld 205 (H) 70 - 99 mg/dL   BUN 9 6 - 23 mg/dL   Creatinine, Ser 0.59 0.40 - 1.50 mg/dL   Total Bilirubin 0.4 0.2 - 1.2 mg/dL   Alkaline Phosphatase 71 39 - 117 U/L   AST 14 0 - 37 U/L   ALT 26 0 - 53 U/L   Total Protein 7.0 6.0 - 8.3 g/dL   Albumin 4.3 3.5 - 5.2 g/dL   Calcium 9.3 8.4 - 10.5 mg/dL   GFR 160.19 >60.00 mL/min    Assessment/Plan: Diabetes mellitus  without complication (HCC)  -Try metformin extended release to see if pt has less GI side effects -Patient given Rx for new glucometer, strips, lancets -Lifestyle modifications encouraged -Handout given -Patient has upcoming eye exam-unsure if is with optometrist or ophthalmologist. if needed will refer to ophthalmology for diabetic retinopathy screening - Plan: POCT glucose (manual entry), metFORMIN (GLUCOPHAGE-XR) 500 MG 24 hr tablet, Hemoglobin A1c, CBC (no diff), Comprehensive metabolic panel, blood glucose meter kit and supplies KIT, DISCONTINUED: blood glucose meter kit and supplies KIT  Screening for cholesterol level  - Plan: Lipid panel  Encounter to establish care -We reviewed the PMH, PSH, FH, SH, Meds and Allergies. -We provided refills for any medications we will prescribe as needed. -We addressed current concerns per orders and patient instructions. -We have asked for records for pertinent exams, studies, vaccines and notes from previous providers. -We have advised patient to follow up per instructions below.  Follow-up in the next month.  At next [facility] will discuss ED.  Grier Mitts, MD

## 2017-05-25 NOTE — Patient Instructions (Signed)
Type 2 Diabetes Mellitus, Diagnosis, Adult Type 2 diabetes (type 2 diabetes mellitus) is a long-term (chronic) disease. In type 2 diabetes, one or both of these problems may be present:  The pancreas does not make enough of a hormone called insulin.  Cells in the body do not respond properly to insulin that the body makes (insulin resistance).  Normally, insulin allows blood sugar (glucose) to enter cells in the body. The cells use glucose for energy. Insulin resistance or lack of insulin causes excess glucose to build up in the blood instead of going into cells. As a result, high blood glucose (hyperglycemia) develops. What increases the risk? The following factors may make you more likely to develop type 2 diabetes:  Having a family member with type 2 diabetes.  Being overweight or obese.  Having an inactive (sedentary) lifestyle.  Having been diagnosed with insulin resistance.  Having a history of prediabetes, gestational diabetes, or polycystic ovarian syndrome (PCOS).  Being of American-Indian, African-American, Hispanic/Latino, or Asian/Pacific Islander descent.  What are the signs or symptoms? In the early stage of this condition, you may not have symptoms. Symptoms develop slowly and may include:  Increased thirst (polydipsia).  Increased hunger(polyphagia).  Increased urination (polyuria).  Increased urination during the night (nocturia).  Unexplained weight loss.  Frequent infections that keep coming back (recurring).  Fatigue.  Weakness.  Vision changes, such as blurry vision.  Cuts or bruises that are slow to heal.  Tingling or numbness in the hands or feet.  Dark patches on the skin (acanthosis nigricans).  How is this diagnosed?  This condition is diagnosed based on your symptoms, your medical history, a physical exam, and your blood glucose level. Your blood glucose may be checked with one or more of the following blood tests:  A fasting blood  glucose (FBG) test. You will not be allowed to eat (you will fast) for at least 8 hours before a blood sample is taken.  A random blood glucose test. This checks blood glucose at any time of day regardless of when you ate.  An A1c (hemoglobin A1c) blood test. This provides information about blood glucose control over the previous 2-3 months.  An oral glucose tolerance test (OGTT). This measures your blood glucose at two times: ? After fasting. This is your baseline blood glucose level. ? Two hours after drinking a beverage that contains glucose.  You may be diagnosed with type 2 diabetes if:  Your FBG level is 126 mg/dL (7.0 mmol/L) or higher.  Your random blood glucose level is 200 mg/dL (11.1 mmol/L) or higher.  Your A1c level is 6.5% or higher.  Your OGGT result is higher than 200 mg/dL (11.1 mmol/L).  These blood tests may be repeated to confirm your diagnosis. How is this treated?  Your treatment may be managed by a specialist called an endocrinologist. Type 2 diabetes may be treated by following instructions from your health care provider about:  Making diet and lifestyle changes. This may include: ? Following an individualized nutrition plan that is developed by a diet and nutrition specialist (registered dietitian). ? Exercising regularly. ? Finding ways to manage stress.  Checking your blood glucose level as often as directed.  Taking diabetes medicines or insulin daily. This helps to keep your blood glucose levels in the healthy range. ? If you use insulin, you may need to adjust the dosage depending on how physically active you are and what foods you eat. Your health care provider will tell you how   to adjust your dosage.  Taking medicines to help prevent complications from diabetes, such as: ? Aspirin. ? Medicine to lower cholesterol. ? Medicine to control blood pressure.  Your health care provider will set individualized treatment goals for you. Your goals will be  based on your age, other medical conditions you have, and how you respond to diabetes treatment. Generally, the goal of treatment is to maintain the following blood glucose levels:  Before meals (preprandial): 80-130 mg/dL (1.6-1.04.4-7.2 mmol/L).  After meals (postprandial): below 180 mg/dL (10 mmol/L).  A1c level: less than 7%.  Follow these instructions at home: Questions to Ask Your Health Care Provider Consider asking the following questions:  Do I need to meet with a diabetes educator?  Where can I find a support group for people with diabetes?  What equipment will I need to manage my diabetes at home?  What diabetes medicines do I need, and when should I take them?  How often do I need to check my blood glucose?  What number can I call if I have questions?  When is my next appointment?  General instructions  Take over-the-counter and prescription medicines only as told by your health care provider.  Keep all follow-up visits as told by your health care provider. This is important.  For more information about diabetes, visit: ? American Diabetes Association (ADA): www.diabetes.org ? American Association of Diabetes Educators (AADE): www.diabeteseducator.org/patient-resources Contact a health care provider if:  Your blood glucose is at or above 240 mg/dL (96.013.3 mmol/L) for 2 days in a row.  You have been sick or have had a fever for 2 days or longer and you are not getting better.  You have any of the following problems for more than 6 hours: ? You cannot eat or drink. ? You have nausea and vomiting. ? You have diarrhea. Get help right away if:  Your blood glucose is lower than 54 mg/dL (3.0 mmol/L).  You become confused or you have trouble thinking clearly.  You have difficulty breathing.  You have moderate or large ketone levels in your urine. This information is not intended to replace advice given to you by your health care provider. Make sure you discuss any  questions you have with your health care provider. Document Released: 02/15/2005 Document Revised: 07/24/2015 Document Reviewed: 03/21/2015 Elsevier Interactive Patient Education  2018 [employer]  Tips for Eating Away From Home If You Have Diabetes Controlling your level of blood glucose, also known as blood sugar, can be challenging. It can be even more difficult when you do not prepare your own meals. The following tips can help you manage your diabetes when you eat away from home. Planning ahead Plan ahead if you know you will be eating away from home:  Ask your health care provider how to time meals and medicine if you are taking insulin.  Make a list of restaurants near you that offer healthy choices. If they have a carry-out menu, take it home and plan what you will order ahead of time.  Look up the restaurant you want to eat at online. Many chain and fast-food restaurants list nutritional information online. Use this information to choose the healthiest options and to calculate how many carbohydrates will be in your meal.  Use a carbohydrate-counting book or mobile app to look up the carbohydrate content and serving size of the foods you want to eat.  Become familiar with serving sizes and learn to recognize how many servings are in a portion.  This will allow you to estimate how many carbohydrates you can eat.  Free foods A "free food" is any food or drink that has less than 5 g of carbohydrates per serving. Free foods include:  Many vegetables.  Hard boiled eggs.  Nuts or seeds.  Olives.  Cheeses.  Meats.  These types of foods make good appetizer choices and are often available at salad bars. Lemon juice, vinegar, or a low-calorie salad dressing of fewer than 20 calories per serving can be used as a "free" salad dressing. Choices to reduce carbohydrates  Substitute nonfat sweetened yogurt with a sugar-free yogurt. Yogurt made from soy milk may also be used, but you  will still want a sugar-free or plain option to choose a lower carbohydrate amount.  Ask your server to take away the bread basket or chips from your table.  Order fresh fruit. A salad bar often offers fresh fruit choices. Avoid canned fruit because it is usually packed in sugar or syrup.  Order a salad, and eat it without dressing. Or, create a "free" salad dressing.  Ask for substitutions. For example, instead of [place] fries, request an order of a vegetable such as salad, green beans, or broccoli. Other tips  If you take insulin, take the insulin once your food arrives to your table. This will ensure your insulin and food are timed correctly.  Ask your server about the portion size before your order, and ask for a take-out box if the portion has more servings than you should have. When your food comes, leave the amount you should have on the plate, and put the rest in the take-out box.  Consider splitting an entree with someone and ordering a side salad. This information is not intended to replace advice given to you by your health care provider. Make sure you discuss any questions you have with your health care provider. Document Released: 02/15/2005 Document Revised: 07/24/2015 Document Reviewed: 05/15/2013 Elsevier Interactive Patient Education  [employer].

## 2017-05-26 ENCOUNTER — Encounter: Payer: Self-pay | Admitting: Family Medicine

## 2017-07-08 MED FILL — METFORMIN HCL ER 500 MG TAB: 500 | 30 days supply | Qty: 30 | Fill #0

## 2017-07-08 MED FILL — FREESTYLE LITE TEST STRIP: 25 days supply | Qty: 100 | Fill #0

## 2017-08-08 ENCOUNTER — Emergency Department (HOSPITAL_COMMUNITY)
Admission: EM | Admit: 2017-08-08 | Discharge: 2017-08-08 | Disposition: A | Discharge status: Home / Self Care | Payer: 59 | Attending: Emergency Medicine | Admitting: Emergency Medicine

## 2017-08-08 ENCOUNTER — Other Ambulatory Visit: Payer: Self-pay

## 2017-08-08 DIAGNOSIS — Y999 Unspecified external cause status: Secondary | ICD-10-CM | POA: Insufficient documentation

## 2017-08-08 DIAGNOSIS — Y939 Activity, unspecified: Secondary | ICD-10-CM | POA: Insufficient documentation

## 2017-08-08 DIAGNOSIS — Y929 Unspecified place or not applicable: Secondary | ICD-10-CM | POA: Insufficient documentation

## 2017-08-08 DIAGNOSIS — F17210 Nicotine dependence, cigarettes, uncomplicated: Secondary | ICD-10-CM | POA: Diagnosis not present

## 2017-08-08 DIAGNOSIS — Z7984 Long term (current) use of oral hypoglycemic drugs: Secondary | ICD-10-CM | POA: Diagnosis not present

## 2017-08-08 DIAGNOSIS — S2096XA Insect bite (nonvenomous) of unspecified parts of thorax, initial encounter: Secondary | ICD-10-CM

## 2017-08-08 DIAGNOSIS — L03313 Cellulitis of chest wall: Secondary | ICD-10-CM | POA: Insufficient documentation

## 2017-08-08 DIAGNOSIS — E119 Type 2 diabetes mellitus without complications: Secondary | ICD-10-CM | POA: Diagnosis not present

## 2017-08-08 DIAGNOSIS — S20462A Insect bite (nonvenomous) of left back wall of thorax, initial encounter: Secondary | ICD-10-CM | POA: Diagnosis not present

## 2017-08-08 DIAGNOSIS — W57XXXA Bitten or stung by nonvenomous insect and other nonvenomous arthropods, initial encounter: Secondary | ICD-10-CM | POA: Diagnosis not present

## 2017-08-08 MED ORDER — DOXYCYCLINE HYCLATE 100 MG PO CAPS: 100.0000 mg | ORAL_CAPSULE | Freq: Two times a day (BID) | ORAL | 0 refills | Status: AC

## 2017-08-08 MED ORDER — DOXYCYCLINE HYCLATE 100 MG PO TABS
100.0000 mg | ORAL_TABLET | Freq: Once | ORAL | Status: AC
Start: 2017-08-08 — End: 2017-08-08
  Administered 2017-08-08: 100 mg via ORAL
  Filled 2017-08-08: qty 1

## 2017-08-08 NOTE — ED Provider Notes (Signed)
[facility] DEPT Provider Note   CSN: [id number] Arrival date & time: 08/08/17  3267     History   Chief Complaint Chief Complaint  Patient presents with  . Wound Infection    HPI Edward [name] is a 42 y.o. male.  HPI   Patient is a 42 year old male with a history of type 2 diabetes, hyperlipidemia, headaches who presents to the emergency department for evaluation of insect bite.  Patient reports that he noticed what he thought was a bite over his left lateral chest wall 3 days ago.  States that initially was pruritic, but over the past day has become increasingly red and painful.  He reports that yesterday the spot developed ahead and drained some gray purulent material.  He states that he has moderate pain over the area which is "pressure-like" and worsened with laying on the left side.  It woke him up in the night several times last night.  He denies fevers, chills, bites or rashes elsewhere, joint pain, headache, abdominal pain, nausea/vomiting.  He tried taking some Advil for pain last night which may have helped his symptoms.  Reports being treated with keflex a month or so ago.   Past Medical History:  Diagnosis Date  . Abscess   . Chicken pox   . 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 Facial pain   . Hyperlipemia   . Increased frequency of headaches   . Migraine     Patient Active Problem List   Diagnosis Date Noted  . Polypharmacy 03/11/2016  . Trigeminal neuralgia pain 12/09/2015  . Scrotal abscess 10/10/2015  . Chronic pain of right lower extremity 11/16/2013  . DM (diabetes mellitus), type 2 ([place]) 04/12/2012  . Migraine headache 04/12/2012  . Soft tissue mass 04/12/2012  . Tobacco use disorder 04/12/2012    Past Surgical History:  Procedure Laterality Date  . I&D EXTREMITY Right 05/08/2015   Procedure: IRRIGATION AND DEBRIDEMENT THUMB;  Surgeon: Iran Planas, MD;  Location: [facility];  Service:  Orthopedics;  Laterality: Right;  . INCISE AND DRAIN ABCESS     left hand and jaw  . IRRIGATION AND DEBRIDEMENT ABSCESS N/A 10/10/2015   Procedure: IRRIGATION AND DEBRIDEMENT SCROTAL ABSCESS;  Surgeon: Alexis Frock, MD;  Location: [facility];  Service: Urology;  Laterality: N/A;        Home Medications    Prior to Admission medications   Medication Sig Start Date End Date Taking? Authorizing Provider  blood glucose meter kit and supplies KIT Dispense based on patient and insurance preference. Use up to four times daily as directed. (FOR ICD-9 250.00, 250.01). 05/25/17   Billie Ruddy, MD  metFORMIN (GLUCOPHAGE-XR) 500 MG 24 hr tablet Take 1 tablet (500 mg total) by mouth daily with breakfast. 05/25/17   Billie Ruddy, MD    Family History Family History  Problem Relation Age of Onset  . Heart disease Mother   . Diabetes Mother   . Multiple sclerosis Mother   . Vascular Disease Mother   . Hyperlipidemia Father   . Stroke Father   . Hypertension Father   . Lung cancer Father   . Diabetes Father   . Cancer Paternal Grandfather        prostate  . Heart disease Paternal Uncle        2 paternal uncles have stents    Social History Social History   Tobacco Use  . Smoking status: Current  Every Day Smoker    Packs/day: 1.00    Years: 31.00    Pack years: 31.00    Types: Cigarettes  . Smokeless tobacco: Never Used  Substance Use Topics  . Alcohol use: No    Alcohol/week: 0.0 oz    Frequency: Never  . Drug use: No     Allergies   Patient has no known allergies.   Review of Systems Review of Systems  Constitutional: Negative for chills and fever.  Gastrointestinal: Negative for abdominal pain, nausea and vomiting.  Musculoskeletal: Negative for arthralgias and gait problem.  Skin: Positive for color change (erythematous over left lateral chest wall ). Negative for rash and wound.  Neurological: Negative for headaches.     Physical Exam Updated Vital Signs BP  121/84 (BP Location: Right Arm)   Pulse 85   Temp 98.1 F (36.7 C) (Oral)   Resp 15   Ht 5' 8"  (1.727 m)   Wt 79.4 kg (175 lb)   SpO2 98%   BMI 26.61 kg/m   Physical Exam  Constitutional: He is oriented to person, place, and time. He appears well-developed and well-nourished. No distress.  NAD. Non-toxic.   HENT:  Head: Normocephalic and atraumatic.  Eyes: Right eye exhibits no discharge. Left eye exhibits no discharge.  Pulmonary/Chest: Effort normal. No respiratory distress.  Neurological: He is alert and oriented to person, place, and time. Coordination normal.  Skin: Skin is warm and dry. Capillary refill takes less than 2 seconds. He is not diaphoretic.  Small, raised 1cm erythematous bump over left lateral chest wall with mild surrounding erythema, induration and warmth (4cm x 3cm.) Area is tender to the touch. No active drainage.   Psychiatric: He has a normal mood and affect. His behavior is normal.  Nursing note and vitals reviewed.    ED Treatments / Results  Labs (all labs ordered are listed, but only abnormal results are displayed) Labs Reviewed - No data to display  EKG None  Radiology No results found.  Procedures Procedures (including critical care time)  EMERGENCY DEPARTMENT US SOFT TISSUE INTERPRETATION "Study: Limited Soft Tissue Ultrasound"  INDICATIONS: Pain and Soft tissue infection Multiple views of the body part were obtained in real-time with a multi-frequency linear probe  PERFORMED BY: Myself IMAGES ARCHIVED?: Yes SIDE:Left BODY PART:Chest wall INTERPRETATION:  No abcess noted and Cellulitis present  Medications Ordered in ED Medications - No data to display   Initial Impression / Assessment and Plan / ED Course  I have reviewed the triage vital signs and the nursing notes.  Pertinent labs & imaging results that were available during my care of the patient were reviewed by me and considered in my medical decision making (see chart  for details).     Presents with local cellulitis over lateral chest wall after presumed bug bite. Bedside US without abscess. Patient afebrile, in NAD and VSS. Reports that he has been treated with keflex recently. Will cover him with doxycycline. Counseled him on reasons to return to the ED and he agrees and voices understanding to above plan and appears reliable for f/u.   Final Clinical Impressions(s) / ED Diagnoses   Final diagnoses:  Cellulitis of chest wall  Insect bite of thoracic wall, unspecified part, initial encounter    ED Discharge Orders        Ordered    doxycycline (VIBRAMYCIN) 100 MG capsule  2 times daily     08/08/17 0744       Geanie Kenning  J, PA-C 08/08/17 0745    Virgel Manifold, MD 08/08/17 [phone]

## 2017-08-08 NOTE — Discharge Instructions (Signed)
Take antibiotic as prescribed.   Return to the ER if any new or concerning symptoms like fever, worsening redness or swelling.

## 2017-08-08 NOTE — ED Triage Notes (Addendum)
Pt reports having some sort of insect bite that occurred on Friday and since then has become larger and red with pain on movement. Site is located under left axilla at rib cage area.

## 2017-08-11 MED FILL — DOXYCYCLINE HYCLATE 100 MG: 100 | 10 days supply | Qty: 20 | Fill #0

## 2019-08-06 ENCOUNTER — Inpatient Hospital Stay
Admit: 2019-08-06 | Discharge: 2019-08-06 | Disposition: A | Discharge status: Home / Self Care | Payer: MEDICAID | Attending: Emergency Medicine

## 2019-08-06 ENCOUNTER — Emergency Department: Admit: 2019-08-06 | Payer: MEDICAID

## 2019-08-06 DIAGNOSIS — S99911A Unspecified injury of right ankle, initial encounter: Secondary | ICD-10-CM

## 2019-08-06 MED ORDER — KETOROLAC TROMETHAMINE 30 MG/ML INJECTION
30 mg/mL (1 mL) | INTRAMUSCULAR | Status: AC
Start: 2019-08-06 — End: 2019-08-06
  Administered 2019-08-06: 14:00:00 via INTRAMUSCULAR

## 2019-08-06 MED FILL — KETOROLAC TROMETHAMINE 30 MG/ML INJECTION: 30 mg/mL (1 mL) | INTRAMUSCULAR | Qty: 1

## 2019-08-06 NOTE — ED Notes (Signed)
 Patient alert, oriented, calm and cooperative at the time of discharge. Discharge instructions including medication changes, if applicable, are reviewed. Patient voiced understanding and agreement with discharge plan. Vital signs are stable. Patient is independent with ambulation. RN, MD and patient are in agreement with discharge plan and there are no safety concerns at this time.

## 2019-08-06 NOTE — Telephone Encounter (Signed)
Received notification that patient was seen at [facility] ER on 08/06/19    Called patient Edwin Alexander    Dx rt ankle sprain    DOI 08/05/19    xrays done at [facility]    Stumbled while walking down stairs carrying laundry inverting ankle    Not wc injury    Patient in air splint and crutches    If patient calls back scheduled NP appt with Dr Elita Boone on 6/14 or 6/17 (ER recommended he be seen in 1 week)    Karolee Stamps

## 2019-08-06 NOTE — ED Notes (Signed)
Possible posterior malleolus fracture identified on x-ray.  The patient was contacted and advised to return to the ER for splinting.  Is also advised to keep any weight off of his foot and ankle.  He was also reminded about the need to follow-up with orthopedics for this injury.  He expressed understanding and intent to return to the ER for this intervention.    The patient returned and got a short-leg posterior splint applied to his left lower extremity.  He tolerated it well.  The digits of his foot are well perfused, warm, no sensory deficits full motor function is observed.

## 2019-08-06 NOTE — ED Provider Notes (Signed)
HPI   Patient presents the ER for evaluation of acute left ankle pain.  He reports the pain is primarily along the lateral aspect of his ankle.  It started last night after he stumbled while walking down stairs carrying laundry.  He reports that he acutely inverted his ankle.  The pain has generally been constant, nonradiating and provoked with bearing weight.  He tried to go to work today, but the pain became increasingly unbearable leading to his visit here in this morning.  He reports taking ibuprofen at approximately 5:00 a.m. this morning.      No past medical history on file.    No past surgical history on file.      No family history on file.    Social History     Socioeconomic History   ??? Marital status: Not on file     Spouse name: Not on file   ??? Number of children: Not on file   ??? Years of education: Not on file   ??? Highest education level: Not on file   Occupational History   ??? Not on file   Tobacco Use   ??? Smoking status: Not on file   Substance and Sexual Activity   ??? Alcohol use: Not on file   ??? Drug use: Not on file   ??? Sexual activity: Not on file   Other Topics Concern   ??? Not on file   Social History Narrative   ??? Not on file     Social Determinants of Health     Financial Resource Strain:    ??? Difficulty of Paying Living Expenses:    Food Insecurity:    ??? Worried About Programme researcher, broadcasting/film/video in the Last Year:    ??? Barista in the Last Year:    Transportation Needs:    ??? Freight forwarder (Medical):    ??? Lack of Transportation (Non-Medical):    Physical Activity:    ??? Days of Exercise per Week:    ??? Minutes of Exercise per Session:    Stress:    ??? Feeling of Stress :    Social Connections:    ??? Frequency of Communication with Friends and Family:    ??? Frequency of Social Gatherings with Friends and Family:    ??? Attends Religious Services:    ??? Database administrator or Organizations:    ??? Attends Engineer, structural:    ??? Marital Status:    Intimate Programme researcher, broadcasting/film/video Violence:    ??? Fear of  Current or Ex-Partner:    ??? Emotionally Abused:    ??? Physically Abused:    ??? Sexually Abused:          ALLERGIES: Patient has no known allergies.    Review of Systems  CONSTITUTIONAL_ROS: no_fever, (-)chills.   ENT_ROS: (-)sore throat, (-)earache.  CARDIOTHORACIC_ROS: (-)chest_pain, (-)palpitations, (-)SOB, no_cough.  GASTROINTESTINAL_ROS: (-)nausea, (-)vomiting.  GENITOURINARY_ROS: (-)dysuria, (-)abnormal appearing urine, (-)polyuria  MUSCULOSKELETAL_ROS: (-)muscle pain, (+)joint pain.  SKIN_ROS: (-)rash, (-)wound  NEUROLOGICAL_ROS: (-)LOC (-)headache.  ENDOCRINE_ROS: (-)fatigue, (-)weakness.  HEMATOLOGIC_ROS:, (-)bleeding, (+)bruising.  Vitals:    08/06/19 0952   BP: (!) 142/70   Pulse: 80   Resp: 16   Temp: 97.9 ??F (36.6 ??C)   SpO2: 99%   Weight: 81.6 kg (180 lb)            Physical Exam   Gen: Well appearing, in no acute distress.  HEENT: Normocephalic, atraumatic.  Resp: Breathing comfortably on  room air. Speaking in full sentences.  Skin: Warm and well perfused.  Neuro: Grossly intact.  Musculoskeletal:       LEFT:  Left knee: Normal appearance, no bony tenderness to palpation, no fibular head tenderness. No soft tissue swelling.   Left ankle: Moderate-severe swelling of lateral ankle, bruising present. Reduced range of motion, no erythema; No medial malleolar bony tenderness; severe lateral malleolar bony tenderness.  Left foot: No fifth metatarsal pain; no midfoot pain; no calcaneal pain. 2+ dorsalis pedis pulse.   MDM  ED Course as of Aug 05 1098   Mon Aug 06, 2019   1057 EXTREMITY_X-RAY:  Right ankle series, routine_views, my_reading. no fracture, no dislocation    [CS]   1058 The patient will be placed in air splint, provided crutches and advised to follow-up with orthopedics in 1 week.  At least grade 2 sprain is suspected due to the pain with ambulation and significance of swelling and bruising to the affected joint.    [CS]      ED Course User Index  [CS] Andrey Farmer, MD      Differential  diagnoses:  Ankle sprain, ankle contusion, ankle fracture, ankle dislocation    MDM:  Patient presents with acute lateral ankle pain after stumbling down stairs the other night.  It is quite swollen with bruising and increased inability to bear weight.  The presence of a fracture cannot be ruled out clinically.  He will need an ankle x-ray for further evaluation of this injury.  In the meantime, Toradol ordered for pain management.    Procedures      NIH Stroke Scale

## 2019-08-06 NOTE — ED Notes (Signed)
Pt states last night he fell off his porch and injured left ankle, he applied ice and elevated it last night, he went to work this morning an was on hiss feet when the pain became too intense. He reports black and blue swelling.

## 2019-08-06 NOTE — Progress Notes (Signed)
Spoke with pt and he stated that he was already called and notified regarding the fx.  He even went back to the ED for a splint and crutches.  Has upcoming appointment with ortho.

## 2019-11-20 ENCOUNTER — Emergency Department: Admit: 2019-11-20 | Payer: MEDICAID

## 2019-11-20 ENCOUNTER — Inpatient Hospital Stay
Admit: 2019-11-20 | Discharge: 2019-11-20 | Disposition: A | Discharge status: Home / Self Care | Payer: MEDICAID | Attending: Physician Assistant

## 2019-11-20 DIAGNOSIS — R1084 Generalized abdominal pain: Secondary | ICD-10-CM

## 2019-11-20 LAB — CBC WITH AUTOMATED DIFF
ABS. BASOPHILS: 0.1 10*3/uL (ref 0.0–0.1)
ABS. EOSINOPHILS: 0.2 10*3/uL (ref 0.0–0.5)
ABS. IMM. GRANS.: 0 10*3/uL (ref 0.00–0.04)
ABS. LYMPHOCYTES: 2.1 10*3/uL (ref 1.3–3.6)
ABS. MONOCYTES: 0.5 10*3/uL (ref 0.3–0.8)
ABS. NEUTROPHILS: 5.2 10*3/uL (ref 1.6–6.1)
ABSOLUTE NRBC: 0 10*3/uL
BASOPHILS: 1 % (ref 0–2)
EOSINOPHILS: 2 % (ref 0–5)
HCT: 43.9 % (ref 42.0–52.0)
HGB: 15.4 g/dL (ref 14.0–18.0)
IMMATURE GRANULOCYTES: 0 % (ref 0.0–0.6)
LYMPHOCYTES: 26 % (ref 14–46)
MCH: 32.4 PG — ABNORMAL HIGH (ref 27.0–31.0)
MCHC: 35.1 g/dL (ref 33.0–37.0)
MCV: 92.4 FL (ref 80.0–94.0)
MONOCYTES: 7 % (ref 5–12)
MPV: 10.3 FL (ref 7.4–10.4)
NEUTROPHILS: 64 % (ref 47–80)
NRBC: 0 PER 100 WBC
PLATELET: 240 10*3/uL (ref 130–400)
RBC: 4.75 M/uL (ref 4.70–6.10)
RDW: 12.3 % (ref 11.5–14.5)
WBC: 8.1 10*3/uL (ref 4.5–10.9)

## 2019-11-20 LAB — METABOLIC PANEL, COMPREHENSIVE
ALT (SGPT): 32 U/L (ref 12–78)
AST (SGOT): 19 U/L (ref 10–37)
Albumin: 4.3 g/dL (ref 3.4–5.0)
Alk. phosphatase: 63 U/L (ref 43–117)
BUN: 13 MG/DL (ref 7–22)
Bilirubin, total: 0.6 mg/dL (ref 0.00–1.00)
CO2: 26 mmol/L (ref 21–32)
Calcium: 9.4 MG/DL (ref 8.5–10.1)
Chloride: 106 mmol/L (ref 98–108)
Creatinine: 0.82 MG/DL (ref 0.70–1.30)
GFR est AA: >60 mL/min/{1.73_m2} (ref >60)
GFR est non-AA: >60 mL/min/{1.73_m2} (ref >60)
Glucose: 121 mg/dL — ABNORMAL HIGH (ref 74–106)
Potassium: 4.1 mmol/L (ref 3.4–5.1)
Protein, total: 7.8 g/dL (ref 6.4–8.2)
Sodium: 136 mmol/L (ref 136–145)

## 2019-11-20 LAB — UA WITH REFLEX MICRO AND CULTURE
Bilirubin: NEGATIVE
Blood: NEGATIVE
Glucose: NEGATIVE mg/dL
Ketone: NEGATIVE mg/dL
Leukocyte Esterase: NEGATIVE
Nitrites: NEGATIVE
Protein: NEGATIVE mg/dL
Specific gravity: 1.025 (ref 1.008–1.030)
Urobilinogen: 0.2 EU/dL (ref 0.2–1.0)
pH (UA): 5.5 (ref 5.0–8.0)

## 2019-11-20 LAB — LIPASE
Lipase: 122 U/L (ref 73–393)
Lipase: 122 U/L (ref 73–393)

## 2019-11-20 LAB — COMPREHENSIVE METABOLIC PANEL
ALT: 32 U/L (ref 12–78)
AST: 19 U/L (ref 10–37)
Albumin: 4.3 g/dL (ref 3.4–5.0)
Alkaline Phosphatase: 63 U/L (ref 43–117)
BUN: 13 MG/DL (ref 7–22)
CO2: 26 mmol/L (ref 21–32)
Calcium: 9.4 MG/DL (ref 8.5–10.1)
Chloride: 106 mmol/L (ref 98–108)
Creatinine: 0.82 MG/DL (ref 0.70–1.30)
EGFR IF NonAfrican American: >60 mL/min/{1.73_m2} (ref >60)
GFR African American: >60 mL/min/{1.73_m2} (ref >60)
Glucose: 121 mg/dL — ABNORMAL HIGH (ref 74–106)
Potassium: 4.1 mmol/L (ref 3.4–5.1)
Sodium: 136 mmol/L (ref 136–145)
Total Bilirubin: 0.6 mg/dL (ref 0.00–1.00)
Total Protein: 7.8 g/dL (ref 6.4–8.2)

## 2019-11-20 LAB — CBC WITH AUTO DIFFERENTIAL
Basophils %: 1 % (ref 0–2)
Basophils Absolute: 0.1 10*3/uL (ref 0.0–0.1)
Eosinophils %: 2 % (ref 0–5)
Eosinophils Absolute: 0.2 10*3/uL (ref 0.0–0.5)
Granulocyte Absolute Count: 0 10*3/uL (ref 0.00–0.04)
Hematocrit: 43.9 % (ref 42.0–52.0)
Hemoglobin: 15.4 g/dL (ref 14.0–18.0)
Immature Granulocytes: 0 % (ref 0.0–0.6)
Lymphocytes %: 26 % (ref 14–46)
Lymphocytes Absolute: 2.1 10*3/uL (ref 1.3–3.6)
MCH: 32.4 PG — ABNORMAL HIGH (ref 27.0–31.0)
MCHC: 35.1 g/dL (ref 33.0–37.0)
MCV: 92.4 FL (ref 80.0–94.0)
MPV: 10.3 FL (ref 7.4–10.4)
Monocytes %: 7 % (ref 5–12)
Monocytes Absolute: 0.5 10*3/uL (ref 0.3–0.8)
NRBC Absolute: 0 10*3/uL
Neutrophils %: 64 % (ref 47–80)
Neutrophils Absolute: 5.2 10*3/uL (ref 1.6–6.1)
Nucleated RBCs: 0 PER 100 WBC
Platelets: 240 10*3/uL (ref 130–400)
RBC: 4.75 M/uL (ref 4.70–6.10)
RDW: 12.3 % (ref 11.5–14.5)
WBC: 8.1 10*3/uL (ref 4.5–10.9)

## 2019-11-20 LAB — URINALYSIS WITH REFLEX TO CULTURE
Bilirubin, Urine: NEGATIVE
Blood, Urine: NEGATIVE
Glucose, Ur: NEGATIVE mg/dL
Ketones, Urine: NEGATIVE mg/dL
Leukocyte Esterase, Urine: NEGATIVE
Nitrite, Urine: NEGATIVE
Protein, UA: NEGATIVE mg/dL
Specific Gravity, UA: 1.025 NA (ref 1.008–1.030)
Urobilinogen, UA, POCT: 0.2 EU/dL (ref 0.2–1.0)
pH, UA: 5.5 NA (ref 5.0–8.0)

## 2019-11-20 MED ORDER — KETOROLAC TROMETHAMINE 30 MG/ML INJECTION
30 mg/mL (1 mL) | INTRAMUSCULAR | Status: AC
Start: 2019-11-20 — End: 2019-11-20
  Administered 2019-11-20: 18:00:00 via INTRAVENOUS

## 2019-11-20 MED ORDER — IOHEXOL 350 MG IODINE/ML INTRAVENOUS SOLUTION
350 mg iodine/mL | Freq: Once | INTRAVENOUS | Status: AC
Start: 2019-11-20 — End: 2019-11-20
  Administered 2019-11-20: 19:00:00 via INTRAVENOUS

## 2019-11-20 MED FILL — OMNIPAQUE 350 MG IODINE/ML INTRAVENOUS SOLUTION: 350 mg iodine/mL | INTRAVENOUS | Qty: 100

## 2019-11-20 MED FILL — KETOROLAC TROMETHAMINE 30 MG/ML INJECTION: 30 mg/mL (1 mL) | INTRAMUSCULAR | Qty: 1

## 2019-11-20 NOTE — ED Notes (Signed)
Pt with 3rd day of worsening 8/10 lower abd pain that intermittently increases to 10/10 suddenly. Denies n/v/diahrrhea, urinary problems. No past abd surgical hx. Pt in obvious discomfort. Holding abd.

## 2019-11-20 NOTE — ED Notes (Signed)
Patient is alert, oriented, calm, and cooperative at time of discharge.  Patient is able to ambulate with a steady gait independently.  Medications, discharge instructions, and follow-up care discussed/reviewed with patient. Understanding is verbalized at this time.  Vital signs are stable at time of discharge -- out of range numbers discussed with care provider and patient is encouraged to follow up with PCP regarding concerns discussed.  RN, patient, family, and provider in agreement with discharge plan. No safety concerns at this time.

## 2019-11-20 NOTE — ED Provider Notes (Signed)
Edwin Alexander is a 44 year old male who is generally quite healthy who presents to the emergency department for evaluation of lower abdominal pain present for the past 2-3 days.  Patient reports somewhat of a sudden onset.  He states the pain is always present, though occasionally will get worse and sharp.  He reports occasionally the pain will radiate into his testicles, though he has no focal testicular pain, dysuria, swelling or notable changes of the GU system.  Denies dysuria, frequency, urgency.  Does report history of hematuria in the past that he attributed to kidney stone, though this has never been formally diagnosed.  No history of abdominal surgery.  No fevers, chills, nausea, vomiting.  Denies headache, lightheadedness is anus.  Denies shortness of breath, chest pain, cough.  No black or bloody stools.  No diarrhea.  No skin rashes.  No recent travel or sick contacts.  Has received 50% of his covid 19 vaccination.           No past medical history on file.    No past surgical history on file.      No family history on file.    Social History     Socioeconomic History   ??? Marital status: MARRIED     Spouse name: Not on file   ??? Number of children: Not on file   ??? Years of education: Not on file   ??? Highest education level: Not on file   Occupational History   ??? Not on file   Tobacco Use   ??? Smoking status: Not on file   Substance and Sexual Activity   ??? Alcohol use: Not on file   ??? Drug use: Not on file   ??? Sexual activity: Not on file   Other Topics Concern   ??? Not on file   Social History Narrative   ??? Not on file     Social Determinants of Health     Financial Resource Strain:    ??? Difficulty of Paying Living Expenses:    Food Insecurity:    ??? Worried About Programme researcher, broadcasting/film/video in the Last Year:    ??? Barista in the Last Year:    Transportation Needs:    ??? Freight forwarder (Medical):    ??? Lack of Transportation (Non-Medical):    Physical Activity:    ??? Days of Exercise per Week:    ???  Minutes of Exercise per Session:    Stress:    ??? Feeling of Stress :    Social Connections:    ??? Frequency of Communication with Friends and Family:    ??? Frequency of Social Gatherings with Friends and Family:    ??? Attends Religious Services:    ??? Database administrator or Organizations:    ??? Attends Engineer, structural:    ??? Marital Status:    Intimate Programme researcher, broadcasting/film/video Violence:    ??? Fear of Current or Ex-Partner:    ??? Emotionally Abused:    ??? Physically Abused:    ??? Sexually Abused:          ALLERGIES: Patient has no known allergies.    Review of Systems   Constitutional: Negative for chills and fever.   Respiratory: Negative.    Cardiovascular: Negative.    Gastrointestinal: Positive for abdominal pain. Negative for diarrhea, nausea and vomiting.   Endocrine: Negative for polydipsia and polyuria.   Genitourinary: Negative for dysuria, flank pain, penile pain, penile swelling  and testicular pain.   Musculoskeletal: Negative for arthralgias and back pain.   Skin: Negative for rash and wound.   Allergic/Immunologic: Negative for food allergies and immunocompromised state.   Neurological: Negative for dizziness and headaches.       Vitals:    11/20/19 1303   BP: 120/89   Pulse: 85   Resp: 18   Temp: 98.7 ??F (37.1 ??C)   SpO2: 99%   Weight: 81.6 kg (180 lb)   Height: 5\' 8"  (1.727 m)            Physical Exam  Vitals and nursing note reviewed.   Constitutional:       General: He is not in acute distress.     Appearance: He is well-developed. He is not toxic-appearing.      Comments: Patient appears uncomfortable, bent over and holding his lower abdomen.   HENT:      Head: Normocephalic and atraumatic.      Mouth/Throat:      Mouth: Mucous membranes are moist.   Eyes:      General: No scleral icterus.     Extraocular Movements: Extraocular movements intact.   Cardiovascular:      Rate and Rhythm: Normal rate and regular rhythm.      Heart sounds: Normal heart sounds.   Pulmonary:      Effort: Pulmonary effort is normal.       Breath sounds: Normal breath sounds. No wheezing or rales.   Abdominal:      General: Abdomen is flat. Bowel sounds are normal.      Palpations: Abdomen is soft.      Tenderness: There is abdominal tenderness in the right lower quadrant, suprapubic area and left lower quadrant. There is no right CVA tenderness, left CVA tenderness, guarding or rebound. Positive signs include McBurney's sign. Negative signs include Murphy's sign, Rovsing's sign and obturator sign.      Hernia: No hernia is present.   Skin:     General: Skin is warm and dry.      Capillary Refill: Capillary refill takes less than 2 seconds.   Neurological:      General: No focal deficit present.      Mental Status: He is alert.   Psychiatric:         Mood and Affect: Mood normal.         Behavior: Behavior normal.          MDM  Number of Diagnoses or Management Options  Abdominal pain, generalized  Diagnosis management comments: Omarr Hann is a 44 year old male who is generally quite healthy who presents to the emergency department for evaluation of lower abdominal pain present for the past 2-3 days.  Patient reports somewhat of a sudden onset.  Initial vital signs today are nonacute.  On exam the patient appears quite uncomfortable.  He is holding his lower abdomen.  There is reproducible tenderness of the central suprapubic region as well as right lower quadrant.  He endorses no testicular pain, swelling.  No clinical evidence to suggest torsion.  No reported changes in bowel movements.  Reports transient history of kidney stone.  Standard abdominal pain order set is conducted with labs as well as CT imaging given his level of discomfort.  Patient is given Toradol for pain.    Labs and imaging today is negative.  Given patient's lower suprapubic/inguinal discomfort I do not suspect ACS or intrathoracic disease.  CT shows large amount of stool/gas with I  suspect is likely the cause for his discomfort today.  This is discussed with the patient.   Recommended fluid diet, GI rest as well as over-the-counter anti-gas medication.  He is comfortable this plan.  Will follow-up with PCP if symptoms are failing to improve and will return for worsening/concerning syndrome.       Amount and/or Complexity of Data Reviewed  Clinical lab tests: reviewed  Tests in the radiology section of CPT??: reviewed      ED Course as of Nov 20 1550   Tue Nov 20, 2019   1420 Labs and urinalysis reassuring.  CT pending.    [EE]   1547 CT is negative.  Per my evaluation there is copious stool and gas.  Evidence of changes of the vas deferens is appreciated.  Patient's glucose is only very mildly elevated.  He is regularly followed by PCP.    [EE]   1551 Reassess patient.  He is feeling improved.  Discussed likely gas/stool.  CT was reviewed by surgery.  Patient stable for discharge home.    [EE]      ED Course User Index  [EE] Annaya Bangert C, PA       Procedures      NIH Stroke Scale
